# Patient Record
Sex: Female | Born: 1950 | Race: Black or African American | Hispanic: No | Marital: Married | Smoking: Never smoker
Health system: Southern US, Community
[De-identification: ages and names within clinical notes are randomized; demographics above are authoritative.]

## PROBLEM LIST (undated history)

## (undated) DIAGNOSIS — E039 Hypothyroidism, unspecified: Secondary | ICD-10-CM

## (undated) DIAGNOSIS — M1990 Unspecified osteoarthritis, unspecified site: Secondary | ICD-10-CM

## (undated) DIAGNOSIS — K449 Diaphragmatic hernia without obstruction or gangrene: Secondary | ICD-10-CM

## (undated) DIAGNOSIS — K222 Esophageal obstruction: Secondary | ICD-10-CM

## (undated) DIAGNOSIS — K649 Unspecified hemorrhoids: Secondary | ICD-10-CM

## (undated) DIAGNOSIS — I219 Acute myocardial infarction, unspecified: Secondary | ICD-10-CM

## (undated) DIAGNOSIS — K219 Gastro-esophageal reflux disease without esophagitis: Secondary | ICD-10-CM

## (undated) DIAGNOSIS — K5792 Diverticulitis of intestine, part unspecified, without perforation or abscess without bleeding: Secondary | ICD-10-CM

## (undated) DIAGNOSIS — I10 Essential (primary) hypertension: Secondary | ICD-10-CM

## (undated) HISTORY — DX: Diverticulitis of intestine, part unspecified, without perforation or abscess without bleeding: K57.92

## (undated) HISTORY — PX: APPENDECTOMY: SHX54

## (undated) HISTORY — DX: Hypothyroidism, unspecified: E03.9

## (undated) HISTORY — DX: Esophageal obstruction: K22.2

## (undated) HISTORY — DX: Unspecified hemorrhoids: K64.9

## (undated) HISTORY — PX: CHOLECYSTECTOMY: SHX55

## (undated) HISTORY — DX: Diaphragmatic hernia without obstruction or gangrene: K44.9

## (undated) HISTORY — DX: Gastro-esophageal reflux disease without esophagitis: K21.9

## (undated) HISTORY — DX: Acute myocardial infarction, unspecified: I21.9

---

## 1984-04-25 HISTORY — PX: ABDOMINAL HYSTERECTOMY: SHX81

## 2003-08-01 ENCOUNTER — Emergency Department (HOSPITAL_COMMUNITY): Admission: EM | Admit: 2003-08-01 | Discharge: 2003-08-01 | Payer: Self-pay | Admitting: Emergency Medicine

## 2003-11-13 ENCOUNTER — Ambulatory Visit (HOSPITAL_COMMUNITY): Admission: RE | Admit: 2003-11-13 | Discharge: 2003-11-13 | Payer: Self-pay | Admitting: Family Medicine

## 2004-07-10 ENCOUNTER — Emergency Department (HOSPITAL_COMMUNITY): Admission: EM | Admit: 2004-07-10 | Discharge: 2004-07-10 | Payer: Self-pay | Admitting: Emergency Medicine

## 2004-11-19 ENCOUNTER — Ambulatory Visit (HOSPITAL_COMMUNITY): Admission: RE | Admit: 2004-11-19 | Discharge: 2004-11-19 | Payer: Self-pay | Admitting: Internal Medicine

## 2004-11-19 ENCOUNTER — Ambulatory Visit: Payer: Self-pay | Admitting: Internal Medicine

## 2004-11-19 HISTORY — PX: COLONOSCOPY: SHX174

## 2004-12-01 ENCOUNTER — Ambulatory Visit (HOSPITAL_COMMUNITY): Admission: RE | Admit: 2004-12-01 | Discharge: 2004-12-01 | Payer: Self-pay | Admitting: Family Medicine

## 2005-09-16 ENCOUNTER — Ambulatory Visit (HOSPITAL_COMMUNITY): Admission: RE | Admit: 2005-09-16 | Discharge: 2005-09-16 | Payer: Self-pay | Admitting: Pulmonary Disease

## 2005-12-13 ENCOUNTER — Ambulatory Visit (HOSPITAL_COMMUNITY): Admission: RE | Admit: 2005-12-13 | Discharge: 2005-12-13 | Payer: Self-pay | Admitting: Pulmonary Disease

## 2006-12-23 ENCOUNTER — Emergency Department (HOSPITAL_COMMUNITY): Admission: EM | Admit: 2006-12-23 | Discharge: 2006-12-23 | Payer: Self-pay | Admitting: Emergency Medicine

## 2007-02-27 ENCOUNTER — Ambulatory Visit (HOSPITAL_COMMUNITY): Admission: RE | Admit: 2007-02-27 | Discharge: 2007-02-27 | Payer: Self-pay | Admitting: Pulmonary Disease

## 2007-05-28 ENCOUNTER — Ambulatory Visit (HOSPITAL_COMMUNITY): Admission: RE | Admit: 2007-05-28 | Discharge: 2007-05-28 | Payer: Self-pay | Admitting: Pulmonary Disease

## 2007-10-24 ENCOUNTER — Ambulatory Visit (HOSPITAL_COMMUNITY): Admission: RE | Admit: 2007-10-24 | Discharge: 2007-10-24 | Payer: Self-pay | Admitting: Pulmonary Disease

## 2008-02-28 ENCOUNTER — Ambulatory Visit (HOSPITAL_COMMUNITY): Admission: RE | Admit: 2008-02-28 | Discharge: 2008-02-28 | Payer: Self-pay | Admitting: Pulmonary Disease

## 2008-04-25 DIAGNOSIS — K5792 Diverticulitis of intestine, part unspecified, without perforation or abscess without bleeding: Secondary | ICD-10-CM

## 2008-04-25 HISTORY — DX: Diverticulitis of intestine, part unspecified, without perforation or abscess without bleeding: K57.92

## 2008-07-29 ENCOUNTER — Encounter (INDEPENDENT_AMBULATORY_CARE_PROVIDER_SITE_OTHER): Payer: Self-pay | Admitting: *Deleted

## 2008-07-30 ENCOUNTER — Ambulatory Visit: Payer: Self-pay | Admitting: Internal Medicine

## 2008-07-30 DIAGNOSIS — R1313 Dysphagia, pharyngeal phase: Secondary | ICD-10-CM

## 2008-07-30 DIAGNOSIS — E669 Obesity, unspecified: Secondary | ICD-10-CM | POA: Insufficient documentation

## 2008-07-30 DIAGNOSIS — Z8679 Personal history of other diseases of the circulatory system: Secondary | ICD-10-CM | POA: Insufficient documentation

## 2008-07-30 DIAGNOSIS — K219 Gastro-esophageal reflux disease without esophagitis: Secondary | ICD-10-CM | POA: Insufficient documentation

## 2008-07-31 ENCOUNTER — Encounter: Payer: Self-pay | Admitting: Internal Medicine

## 2008-08-15 ENCOUNTER — Ambulatory Visit (HOSPITAL_COMMUNITY): Admission: RE | Admit: 2008-08-15 | Discharge: 2008-08-15 | Payer: Self-pay | Admitting: Internal Medicine

## 2008-08-15 ENCOUNTER — Ambulatory Visit: Payer: Self-pay | Admitting: Internal Medicine

## 2008-08-15 HISTORY — PX: ESOPHAGOGASTRODUODENOSCOPY: SHX1529

## 2008-10-10 ENCOUNTER — Emergency Department (HOSPITAL_COMMUNITY): Admission: EM | Admit: 2008-10-10 | Discharge: 2008-10-10 | Payer: Self-pay | Admitting: Emergency Medicine

## 2008-10-28 ENCOUNTER — Encounter (INDEPENDENT_AMBULATORY_CARE_PROVIDER_SITE_OTHER): Payer: Self-pay | Admitting: *Deleted

## 2008-12-02 ENCOUNTER — Encounter (INDEPENDENT_AMBULATORY_CARE_PROVIDER_SITE_OTHER): Payer: Self-pay | Admitting: *Deleted

## 2008-12-22 ENCOUNTER — Telehealth (INDEPENDENT_AMBULATORY_CARE_PROVIDER_SITE_OTHER): Payer: Self-pay | Admitting: *Deleted

## 2008-12-23 ENCOUNTER — Telehealth (INDEPENDENT_AMBULATORY_CARE_PROVIDER_SITE_OTHER): Payer: Self-pay | Admitting: *Deleted

## 2009-03-10 ENCOUNTER — Ambulatory Visit (HOSPITAL_COMMUNITY): Admission: RE | Admit: 2009-03-10 | Discharge: 2009-03-10 | Payer: Self-pay | Admitting: Pulmonary Disease

## 2009-10-09 ENCOUNTER — Ambulatory Visit (HOSPITAL_COMMUNITY): Admission: RE | Admit: 2009-10-09 | Discharge: 2009-10-09 | Payer: Self-pay | Admitting: Pulmonary Disease

## 2009-11-18 ENCOUNTER — Encounter: Payer: Self-pay | Admitting: Internal Medicine

## 2009-11-18 ENCOUNTER — Ambulatory Visit: Payer: Self-pay | Admitting: Gastroenterology

## 2009-11-18 DIAGNOSIS — K921 Melena: Secondary | ICD-10-CM | POA: Insufficient documentation

## 2009-11-19 ENCOUNTER — Encounter: Payer: Self-pay | Admitting: Internal Medicine

## 2009-12-11 ENCOUNTER — Ambulatory Visit (HOSPITAL_COMMUNITY): Admission: RE | Admit: 2009-12-11 | Discharge: 2009-12-11 | Payer: Self-pay | Admitting: Internal Medicine

## 2009-12-11 ENCOUNTER — Ambulatory Visit: Payer: Self-pay | Admitting: Internal Medicine

## 2009-12-11 HISTORY — PX: COLONOSCOPY: SHX174

## 2010-03-12 ENCOUNTER — Ambulatory Visit (HOSPITAL_COMMUNITY): Admission: RE | Admit: 2010-03-12 | Discharge: 2010-03-12 | Payer: Self-pay | Admitting: Pulmonary Disease

## 2010-03-26 ENCOUNTER — Encounter: Admission: RE | Admit: 2010-03-26 | Discharge: 2010-03-26 | Payer: Self-pay | Admitting: Pulmonary Disease

## 2010-04-05 ENCOUNTER — Encounter: Payer: Self-pay | Admitting: Orthopedic Surgery

## 2010-04-06 ENCOUNTER — Ambulatory Visit (HOSPITAL_COMMUNITY)
Admission: RE | Admit: 2010-04-06 | Discharge: 2010-04-06 | Payer: Self-pay | Source: Home / Self Care | Attending: Pulmonary Disease | Admitting: Pulmonary Disease

## 2010-05-05 ENCOUNTER — Encounter: Payer: Self-pay | Admitting: Orthopedic Surgery

## 2010-05-05 ENCOUNTER — Ambulatory Visit
Admission: RE | Admit: 2010-05-05 | Discharge: 2010-05-05 | Payer: Self-pay | Source: Home / Self Care | Attending: Orthopedic Surgery | Admitting: Orthopedic Surgery

## 2010-05-05 DIAGNOSIS — M7710 Lateral epicondylitis, unspecified elbow: Secondary | ICD-10-CM | POA: Insufficient documentation

## 2010-05-05 DIAGNOSIS — M7700 Medial epicondylitis, unspecified elbow: Secondary | ICD-10-CM | POA: Insufficient documentation

## 2010-05-25 NOTE — Assessment & Plan Note (Signed)
Summary: HEMATOCHEZIA/SS   Visit Type:  Initial Consult Referring Provider:  Juanetta Gosling Primary Care Provider:  Juanetta Gosling  Chief Complaint:  hematochezia.  History of Present Illness: Mackenzie Mcpherson is a pleasant 60 y/o AA female who presents at the request of Dr. Juanetta Gosling for further evaluation of hematachezia. Recently had couple days of brbpr, described as small volume. Hgb 11.9 (12 normal). Took supp and it stopped. BM regular but occasional mild constipation. No melena. C/O heartburn worse recently. Was using Prilosec prn. Trying to change diet. Feels full, burning, gas, epigastric pain especially with certain foods. Recently started back on Prilosec daily for daily symptoms, nocturnal symptoms.  In 6/10, she had CT A/P for LLQ pain. CT A/P-->Mild inflammatory change at the descending colon sigmoid colon junction region.  Felt to be secondary to mild diverticulitis. Epiploic appendagitis would be another possibility.   Current Medications (verified): 1)  Benicar Hct 20-12.5 Mg Tabs (Olmesartan Medoxomil-Hctz) .... Once Daily 2)  Prilosec 20 Mg Cpdr (Omeprazole) .... Once Daily 3)  Multivitamins  Tabs (Multiple Vitamin) .... Two Tablets Daily 4)  Fish Oil 1000 Mg Caps (Omega-3 Fatty Acids) .... Once Daily 5)  Vitamin C Cr 500 Mg Cr-Caps (Ascorbic Acid) .... Once Daily 6)  Saline Mist .... As Directed 7)  Levothroid 50 Mcg Tabs (Levothyroxine Sodium) .... Take 1 Tablet By Mouth Once A Day  Allergies (verified): 1)  ! * Protonix  Past History:  Past Medical History: Hypertension GERD Obesity Hypothyroidism Diverticulitis, 6/10 CT TCS 7/06-->normal EGD/ED 4/10-->Noncritical Schatzki's ring with superimposed distal esophageal erosion consistent with erosive reflux esophagitis status post passage of a Maloney dilator and biopsy disruption. Moderate size hiatal hernia.  Past Surgical History: Partial hysterectomy 1987 Appendectomy 1970s Cholecystectomy 1970s  Family  History: Father: (deceased 42) accident  Mother: (deceased 65) DM, CVA Siblings: (4)  1  brother colon carcinoma dx late 30's, htn, arrythmia, 2nd brother recently diagnosed with colon carcinoma, age 32, taking chemo  Social History: Marital Status: Married 1982 Children: no Occupation:  retired, [employer] officer. Owns [employer]  Patient has never smoked.  Alcohol Use - no Daily Caffeine Use Illicit Drug Use - no Patient does not get regular exercise.   Review of Systems General:  Denies fever, chills, sweats, anorexia, fatigue, weakness, and weight loss. Eyes:  Denies vision loss. ENT:  Denies nasal congestion, sore throat, hoarseness, and difficulty swallowing. CV:  Denies chest pains, angina, palpitations, dyspnea on exertion, and peripheral edema. Resp:  Denies dyspnea at rest, dyspnea with exercise, cough, sputum, and wheezing. GI:  See HPI. GU:  Denies urinary burning and blood in urine. MS:  Denies joint pain / LOM. Derm:  Denies rash and itching. Neuro:  Denies weakness, frequent headaches, memory loss, and confusion. Psych:  Denies depression and anxiety. Endo:  Denies unusual weight change. Heme:  Denies bruising and bleeding. Allergy:  Denies hives and rash.  Vital Signs:  Patient profile:   60 year old female Menstrual status:  hysterectomy Height:      59 inches Weight:      163 pounds BMI:     33.04 Temp:     98.8 degrees F oral Pulse rate:   88 / minute BP sitting:   122 / 78  (left arm) Cuff size:   regular  Vitals Entered By: Cloria Spring LPN (November 18, 2009 2:08 PM)  Physical Exam  General:  Well developed, well nourished, no acute distress. Head:  Normocephalic and atraumatic. Eyes:  Conjunctivae pink, no scleral icterus.  Mouth:  Oropharyngeal mucosa moist, pink.  No lesions, erythema or exudate.    Neck:  Supple; no masses or thyromegaly. Lungs:  Clear throughout to auscultation. Heart:  Regular rate  and rhythm; no murmurs, rubs,  or bruits. Abdomen:  Bowel sounds normal.  Abdomen is soft, nontender, nondistended.  No rebound or guarding.  No hepatosplenomegaly, masses or hernias.  No abdominal bruits.  Rectal:  deferred until time of colonoscopy.   Extremities:  No clubbing, cyanosis, edema or deformities noted. Neurologic:  Alert and  oriented x4;  grossly normal neurologically. Skin:  Intact without significant lesions or rashes. Cervical Nodes:  No significant cervical adenopathy. Psych:  Alert and cooperative. Normal mood and affect.  Impression & Recommendations:  Problem # 1:  BLOOD IN STOOL (ICD-578.1)  Recent small volume hematachezia. In 2010, had abnormality had descending colon/sigmoid colon area thought to be due to diverticulitis. Two brother with h/o CRC, one dx in his 37s, the other recently in his 80s. Her last TCS was five years ago.  Colonoscopy to be performed in near future.  Risks, alternatives, and benefits including but not limited to the risk of reaction to medication, bleeding, infection, and perforation were addressed.  Patient voiced understanding and provided verbal consent.   Orders: Consultation Level III (16109)  Problem # 2:  GERD (ICD-530.81)  Recommend daily Prilosec for frequent GERD symptoms. If symptoms are not adequately controlled, consider different PPI. No EGD at this time, given recent one in 2010.  Orders: Consultation Level III (60454) I would like to thank Dr. Juanetta Gosling for allowing [place] to take part in the care of this nice patient.

## 2010-05-25 NOTE — Letter (Signed)
Summary: TCS Order-  TCS Order-   Imported By: Peggyann Shoals 11/18/2009 14:49:22  _____________________________________________________________________  External Attachment:    Type:   Image     Comment:   External Document

## 2010-05-27 NOTE — Assessment & Plan Note (Signed)
Summary: rt elbow pain [facility]r at [facility]   Visit Type:  new patient Referring Provider:  Juanetta Gosling Primary Provider:  Juanetta Gosling  CC:  right elbow pain.  History of Present Illness: I saw Mackenzie Mcpherson in the office today for an initial visit.  She is a 60 years old woman with the complaint of:  right elbow pain.  October 2011 fell up stairs.  Xrays right elbow [facility] 04/06/10.  This 60 year old female complains of sharp intermittent RIGHT elbow pain medial side greater than lateral side since a fall in October of 2011. She had an x-ray in December, which did not show any fracture.  Her pain radiates between a 5 and 7/10. She has not had any treatment at this point other than some over-the-counter Tylenol.  She has increased pain when she tries to hold onto something or lift something such as a basket    Allergies: 1)  ! * Protonix  Past History:  Past Medical History: Last updated: Nov 27, 2009 Hypertension GERD Obesity Hypothyroidism Diverticulitis, 6/10 CT TCS 7/06-->normal EGD/ED 4/10-->Noncritical Schatzki's ring with superimposed distal esophageal erosion consistent with erosive reflux esophagitis status post passage of a Maloney dilator and biopsy disruption. Moderate size hiatal hernia.  Past Surgical History: Last updated: Nov 27, 2009 Partial hysterectomy 1987 Appendectomy 1970s Cholecystectomy 1970s  Family History: Last updated: 11-27-2009 Father: (deceased 27) accident  Mother: (deceased 11) DM, CVA Siblings: (4)  1  brother colon carcinoma dx late 30's, htn, arrythmia, 2nd brother recently diagnosed with colon carcinoma, age 75, taking chemo  Social History: Last updated: November 27, 2009 Marital Status: Married 1982 Children: no Occupation:  retired, [employer] officer. Owns [employer]  Patient has never smoked.  Alcohol Use - no Daily Caffeine Use Illicit Drug Use - no Patient does not get regular exercise.    Risk Factors: Exercise: no (07/30/2008)  Risk Factors: Smoking Status: never (07/30/2008)  Review of Systems Constitutional:  Complains of weight gain and fatigue; denies weight loss, fever, and chills. Respiratory:  Complains of couch; denies short of breath, wheezing, tightness, pain on inspiration, and snoring . Gastrointestinal:  Complains of heartburn; denies nausea, vomiting, diarrhea, constipation, and blood in your stools. Musculoskeletal:  Complains of muscle pain; denies joint pain, swelling, instability, stiffness, redness, and heat. Immunology:  Complains of seasonal allergies; denies sinus problems and allergic to bee stings.  The review of systems is negative for Cardiovascular, Genitourinary, Neurologic, Endocrine, Psychiatric, Skin, HEENT, and Hemoatologic.  Physical Exam  Additional Exam:  general appearance was normal.  Grooming and hygiene were normal.  She is oriented x3. Her mood and affect were normal.  Abnormal ambulation with no support.  Friable. Inspection revealed tenderness on the medial and lateral sides of the elbow and the flexor tendons and extensor tendons. She had no pain with extension against resistance with the elbow extended, but did have pain with pronation and with flexion of the wrist, and it was on the medial side of the elbow.  The elbow was stable. There was no loss of strength. The skin was normal.  Pulse and temperature were normal.  There is no lymphadenopathy.  Sensation was normal as well. There were no pathologic reflexes and balance was normal.   Impression & Recommendations:  Problem # 1:  LATERAL EPICONDYLITIS (ICD-726.32) Assessment New  The x-rays were done at [facility]. The report and the films have been reviewed.   She apparently has medial and lateral epicondylitis with medial predominating.  Orders: New Patient Level III (  35573)  Problem # 2:  MEDIAL EPICONDYLITIS (ICD-726.31)  Orders: New Patient Level III  (22025)  Medications Added to Medication List This Visit: 1)  Nabumetone 500 Mg Tabs (Nabumetone) .Marland Kitchen.. 1 by mouth two times a day  Patient Instructions: 1)  DIAGNOSIS IS TENDONITIS  2)  TREATMENT PLAN  3)  6 WEEKS OF BRACING, ANTIINFLAMMATORIES, ICE AND HOME EXERCISE PROGRAM  4)  RECHECK IN 6 WEEKS  Prescriptions: NABUMETONE 500 MG TABS (NABUMETONE) 1 by mouth two times a day  #60 x 1   Entered and Authorized by:   Fuller Canada MD   Signed by:   Fuller Canada MD on 05/05/2010   Method used:   Print then Give to Patient   RxID:   [id number]    Orders Added: 1)  New Patient Level III [61607]

## 2010-05-27 NOTE — Letter (Signed)
Summary: history form   history form   Imported By: Eugenio Hoes 05/05/2010 15:45:46  _____________________________________________________________________  External Attachment:    Type:   Image     Comment:   External Document

## 2010-05-28 NOTE — Letter (Signed)
Summary: PATIENT RECORDS FROM DR [facility]  PATIENT RECORDS FROM DR [facility]   Imported By: Rexene Alberts 11/19/2009 10:50:06  _____________________________________________________________________  External Attachment:    Type:   Image     Comment:   External Document

## 2010-06-17 ENCOUNTER — Ambulatory Visit (INDEPENDENT_AMBULATORY_CARE_PROVIDER_SITE_OTHER): Payer: Federal, State, Local not specified - PPO | Admitting: Orthopedic Surgery

## 2010-06-17 ENCOUNTER — Encounter: Payer: Self-pay | Admitting: Orthopedic Surgery

## 2010-06-17 DIAGNOSIS — M79609 Pain in unspecified limb: Secondary | ICD-10-CM | POA: Insufficient documentation

## 2010-06-17 DIAGNOSIS — M7710 Lateral epicondylitis, unspecified elbow: Secondary | ICD-10-CM

## 2010-06-22 NOTE — Medication Information (Signed)
Summary: Tax adviser   Imported By: Cammie Sickle 06/18/2010 11:24:46  _____________________________________________________________________  External Attachment:    Type:   Image     Comment:   External Document

## 2010-06-22 NOTE — Assessment & Plan Note (Signed)
Summary: 6 we reck rt elbow/[facility]bs/c[facility]    work # [phone]   Visit Type:  Follow-up Referring Provider:  Juanetta Gosling Primary Provider:  Juanetta Gosling  CC:  right elbow.  History of Present Illness: I saw Mackenzie Mcpherson in the office today for a 6 week  followup visit.  She is a 60 years old woman with the complaint of:  right elbow  October 2011 fell up stairs.  Xrays right elbow [facility] 04/06/10.  This 60 year old female complains of sharp intermittent RIGHT elbow pain medial side greater than lateral side since a fall in October of 2011. She had an x-ray in December, which did not show any fracture.  Medications: Nabumetone 500 Mg Tabs (Nabumetone) .Marland Kitchen.. 1 by mouth two times a day  Complaints: She states that she feels better, she still has some pain. she does not take the medicine all the time because it makes her sleepy.  Allergies: 1)  ! * Protonix  Physical Exam  Additional Exam:  RIGHT elbow  The lateral elbow symptoms seem to have gotten better.  She did not have pain with wrist extension against resistance.  She does have some anterior pain in the biceps tendon and biceps muscle area which is exacerbated by flexion.  She had mild impingement sign.     Impression & Recommendations:  Problem # 1:  LATERAL EPICONDYLITIS (ICD-726.32) Assessment Improved  continued counterforce brace for working.  Continue exercises.  Take medication nabumetone and night 2 tablets at a time  Orders: Est. Patient Level II (86578)  Problem # 2:  ARM PAIN, RIGHT (ICD-729.5) Assessment: New  pain in the biceps area most likely soft tissue muscle inflammation recommend diaphoresis  Orders: Est. Patient Level II (46962)  Patient Instructions: 1)  APPLY BIOFREEZE two times a day TO three times a day TO ARM AS NEEDED  2)  WEAR BRACE TO WORK  3)  EXERCISE ELBOW AS NEEDED    Orders Added: 1)  Est. Patient Level II [95284]

## 2010-08-02 LAB — URINE CULTURE
Colony Count: NO GROWTH
Culture: NO GROWTH

## 2010-08-02 LAB — COMPREHENSIVE METABOLIC PANEL
AST: 43 U/L — ABNORMAL HIGH (ref 0–37)
CO2: 31 mEq/L (ref 19–32)
Calcium: 9.1 mg/dL (ref 8.4–10.5)
Creatinine, Ser: 0.82 mg/dL (ref 0.4–1.2)
GFR calc Af Amer: >60 mL/min (ref >60)
GFR calc non Af Amer: >60 mL/min (ref >60)

## 2010-08-02 LAB — DIFFERENTIAL
Eosinophils Relative: 2 % (ref 0–5)
Lymphocytes Relative: 28 % (ref 12–46)
Lymphs Abs: 1.6 10*3/uL (ref 0.7–4.0)
Neutrophils Relative %: 65 % (ref 43–77)

## 2010-08-02 LAB — CBC
MCHC: 35.3 g/dL (ref 30.0–36.0)
RBC: 4.24 MIL/uL (ref 3.87–5.11)
WBC: 5.8 10*3/uL (ref 4.0–10.5)

## 2010-08-02 LAB — URINALYSIS, ROUTINE W REFLEX MICROSCOPIC
Bilirubin Urine: NEGATIVE
Glucose, UA: NEGATIVE mg/dL
Ketones, ur: NEGATIVE mg/dL
pH: 5.5 (ref 5.0–8.0)

## 2010-09-07 NOTE — Op Note (Signed)
NAME:  Mackenzie Mcpherson, Mackenzie Mcpherson         ACCOUNT NO.:  [id number]   MEDICAL RECORD NO.:  [id number]          PATIENT TYPE:  AMB   LOCATION:  DAY                           FACILITY:  [facility]   PHYSICIAN:  R. Roetta Sessions, M.D. DATE OF BIRTH:  [birth year]   DATE OF PROCEDURE:  08/15/2008  DATE OF DISCHARGE:                               OPERATIVE REPORT   PROCEDURE PERFORMED:  Esophagogastroduodenoscopy with Elease Hashimoto dilation.   ENDOSCOPIST:  Jonathon Bellows, M.D.   INDICATIONS FOR THE PROCEDURE:  The patient is a 59 year old lady with  recurrent esophageal dysphagia in a background setting of chronic  gastroesophageal reflux disease.  She had her esophagus dilated back in  the 1970s in [place] and she reports with good results.  EGD is now  being done and the potential for esophageal dilation reviewed.  Her  reflux symptoms more recently have been well-controlled on Prilosec 20  mg orally daily.  She developed angioedema after a brief course of  Protonix.  This approach had been discussed with the patient at length;  please see the documentation in the medical record.  It had been  discussed with her again at the bedside reviewing the risks, benefits,  alternatives and limitations.   DESCRIPTION OF THE PROCEDURE:  The O2 saturation, blood pressure and  pulse were monitored throughout the entire procedure.  Conscious  sedation was with Versed 4 mg IV and Demerol 75 mg IV in divided doses.  Instrument; Pentax Video Chip System.  Cetacaine spray for topical  pharyngeal anesthesia.   Findings:  Esophagus:  It is notable that the patient's oral cavity was small, and  she had a prominent tongue and difficulty directly intubating the  hypopharynx/esophagus.  I eventually went indirectly guiding the scope  back with my fingers and was able to fairly easily intubate the  esophagus.  Examination of the tubular esophagus revealed a patulous EG  junction and a noncritical appearing Schatzki's ring.   There was a  common cavity phenomenon between the distal esophagus and stomach.  I  could see well into the stomach from the distal third of the esophagus.  There was a single esophageal erosion straddling the EG junction  suggestive of Barrett's esophagus __________ tumor.   Stomach:  The gastric cavity was empty and insufflated well with air for  thorough examination of the gastric mucosa including retroflexion.  The  proximal stomach and esophagogastric junction demonstrated only a  moderate size hiatal hernia; and, again the ring was noted.  Pylorus was  patent and was easily traversed.  Examination of the bulb and second  portion revealed no abnormalities.   This lady did not have a tight ring although was complaining of  dysphagia.  I did not think a 56-French Maloney or a larger bore dilator  would pass through the mouth.  I obtained a 54-French Maloney dilator  and gently guided it into the oral cavity through the hypopharynx and  advanced it fully with minimal resistance.  A look back revealed no  apparent complication related to passage of the dilator; however, the  ring remained very much intact.  Subsequently, I  utilized a jumbo biopsy  forceps and strategically took two bites of the ring to disrupt it.  This was done with very good results and minimal bleeding.   The patient tolerated the procedure well and was reactive after  endoscopy.   IMPRESSION:  1. Noncritical Schatzki's ring with superimposed distal esophageal      erosion consistent with erosive reflux esophagitis status post      passage of a Maloney dilator and biopsy disruption of the ring as      described above.  2. Moderate size hiatal hernia.  3. Otherwise normal stomach, duodenum-1 and duodenum-2.   RECOMMENDATIONS:  Hiatal hernia and gastroesophageal reflux disease  literature was provided to the patient.  For the next month I have asked  her to increase her Prilosec to 20 mg orally twice daily,  i.e. before  breakfast and supper, and thereafter to drop back to Prilosec 20 mg each  morning before breakfast.      R. Roetta Sessions, M.D.  Electronically Signed     RMR/MEDQ  D:  08/15/2008  T:  08/15/2008  Job:  [id number]   cc:   Ramon Dredge L. Juanetta Gosling, M.D.  Fax: [phone]

## 2010-09-10 NOTE — Op Note (Signed)
NAME:  Mackenzie Mcpherson, Mackenzie Mcpherson         ACCOUNT NO.:  [id number]   MEDICAL RECORD NO.:  [id number]          PATIENT TYPE:  AMB   LOCATION:  DAY                           FACILITY:  [facility]   PHYSICIAN:  R. Roetta Sessions, M.D. DATE OF BIRTH:  16-Jan-1951   DATE OF PROCEDURE:  11/19/2004  DATE OF DISCHARGE:                                 OPERATIVE REPORT   PROCEDURE:  Screening colonoscopy.   INDICATIONS FOR PROCEDURE:  The patient is a 60 year old lady with a history  of colonic polyps [place] in [place], [place] some five years ago. Positive  family history most significant for a brother diagnosed with colorectal  cancer in his 30s. She has no lower GI tract symptoms. Colonoscopy is now  being done. This approach has been discussed with the patient at length.  Potential risks, benefits, and alternatives have been reviewed and questions  answered. She is agreeable. Please see documentation in the medical record.   PROCEDURE NOTE:  O2 saturation, blood pressure, pulse, and respirations were  monitored throughout the entire procedure. Conscious sedation with Versed 3  mg IV and Demerol 75 mg IV in divided doses.   INSTRUMENT:  Olympus video chip system, adult and pediatric colonoscopy.   FINDINGS:  Digital rectal exam revealed no abnormalities.   ENDOSCOPIC FINDINGS:  Prep was good.   Rectum:  Examination of the rectal mucosa including retroflexed view of the  anal verge revealed no abnormalities.   Colon:  Colonic mucosa was surveyed from the rectosigmoid junction. After  about 40 cm, there was an acute turn in the colon which we could not over  come with the adult scope. I withdrew the adult scope and obtained a  pediatric colonoscopy, advanced to this level, was able to get around the  turn, and advanced scope to the cecum. Cecum, ileocecal valve, and  appendiceal orifice were well seen and photographed for the record. From  this level, the scope was slowly withdrawn, and all  previously mentioned  mucosal surfaces were again seen. The colonic mucosa appeared entirely  normal. The patient tolerated the procedure well and was reactive to  endoscopy.   IMPRESSION:  1.  Normal rectum.  2.  Normal colon.   RECOMMENDATIONS:  Repeat high risk screening colonoscopy five years.       RMR/MEDQ  D:  11/19/2004  T:  11/19/2004  Job:  [id number]   cc:   Melvyn Novas, MD  [address] [place]  [place] [address]  Fax: [phone]

## 2011-02-04 LAB — URINALYSIS, ROUTINE W REFLEX MICROSCOPIC
Glucose, UA: NEGATIVE
pH: 6

## 2011-02-04 LAB — URINE MICROSCOPIC-ADD ON

## 2011-03-24 ENCOUNTER — Other Ambulatory Visit (HOSPITAL_COMMUNITY): Payer: Self-pay | Admitting: Pulmonary Disease

## 2011-03-24 DIAGNOSIS — Z139 Encounter for screening, unspecified: Secondary | ICD-10-CM

## 2011-04-04 ENCOUNTER — Ambulatory Visit (HOSPITAL_COMMUNITY): Payer: Federal, State, Local not specified - PPO

## 2011-04-07 ENCOUNTER — Ambulatory Visit (HOSPITAL_COMMUNITY)
Admission: RE | Admit: 2011-04-07 | Discharge: 2011-04-07 | Disposition: A | Discharge status: Home / Self Care | Payer: Federal, State, Local not specified - PPO | Source: Ambulatory Visit | Attending: Pulmonary Disease | Admitting: Pulmonary Disease

## 2011-04-07 DIAGNOSIS — Z139 Encounter for screening, unspecified: Secondary | ICD-10-CM

## 2011-04-07 DIAGNOSIS — Z1231 Encounter for screening mammogram for malignant neoplasm of breast: Secondary | ICD-10-CM | POA: Insufficient documentation

## 2011-04-11 ENCOUNTER — Emergency Department (HOSPITAL_COMMUNITY)
Admission: EM | Admit: 2011-04-11 | Discharge: 2011-04-11 | Disposition: A | Discharge status: Home / Self Care | Payer: Federal, State, Local not specified - PPO | Attending: Emergency Medicine | Admitting: Emergency Medicine

## 2011-04-11 ENCOUNTER — Emergency Department (HOSPITAL_COMMUNITY): Payer: Federal, State, Local not specified - PPO

## 2011-04-11 DIAGNOSIS — M79676 Pain in unspecified toe(s): Secondary | ICD-10-CM

## 2011-04-11 DIAGNOSIS — E079 Disorder of thyroid, unspecified: Secondary | ICD-10-CM | POA: Insufficient documentation

## 2011-04-11 DIAGNOSIS — M79609 Pain in unspecified limb: Secondary | ICD-10-CM | POA: Insufficient documentation

## 2011-04-11 DIAGNOSIS — Z79899 Other long term (current) drug therapy: Secondary | ICD-10-CM | POA: Insufficient documentation

## 2011-04-11 DIAGNOSIS — M2550 Pain in unspecified joint: Secondary | ICD-10-CM | POA: Insufficient documentation

## 2011-04-11 DIAGNOSIS — I10 Essential (primary) hypertension: Secondary | ICD-10-CM | POA: Insufficient documentation

## 2011-04-11 DIAGNOSIS — L989 Disorder of the skin and subcutaneous tissue, unspecified: Secondary | ICD-10-CM | POA: Insufficient documentation

## 2011-04-11 HISTORY — DX: Essential (primary) hypertension: I10

## 2011-04-11 MED ORDER — CEPHALEXIN 500 MG PO CAPS: 500.0000 mg | ORAL_CAPSULE | Freq: Four times a day (QID) | ORAL | Status: AC

## 2011-04-11 NOTE — ED Notes (Signed)
Pt has had sore on R 4th toe x 1 week.

## 2011-04-13 NOTE — ED Provider Notes (Signed)
History     CSN: [id number] Arrival date & time: 04/11/2011 10:00 AM   First MD Initiated Contact with Patient 04/11/11 1049      Chief Complaint  Patient presents with  . Toe Pain    (Consider location/radiation/quality/duration/timing/severity/associated sxs/prior treatment) HPI Comments: Patient reports pain and hardened area to the right fourth toe for one week.  Sx's began after she was wearing tight fitting shoes.  She denies fever, redness , or drainage from the toe.    Patient is a 60 y.o. female presenting with toe pain. The history is provided by the patient.  Toe Pain This is a new problem. The current episode started 1 to 4 weeks ago. The problem occurs constantly. The problem has been unchanged. Associated symptoms include arthralgias. Pertinent negatives include no fever, joint swelling, myalgias, numbness, rash or weakness. The symptoms are aggravated by walking and standing. She has tried nothing for the symptoms. The treatment provided no relief.    Past Medical History  Diagnosis Date  . Hypertension   . Thyroid disease     Past Surgical History  Procedure Date  . Abdominal hysterectomy   . Cholecystectomy     No family history on file.  History  Substance Use Topics  . Smoking status: Never Smoker   . Smokeless tobacco: Not on file  . Alcohol Use: No    OB History    Grav Para Term Preterm Abortions TAB SAB Ect Mult Living                  Review of Systems  Constitutional: Negative for fever.  Musculoskeletal: Positive for arthralgias. Negative for myalgias, joint swelling and gait problem.  Skin: Negative for rash and wound.  Neurological: Negative for weakness and numbness.  All other systems reviewed and are negative.    Allergies  Pantoprazole sodium  Home Medications   Current Outpatient Rx  Name Route Sig Dispense Refill  . LEVOTHYROXINE SODIUM 50 MCG PO TABS Oral Take 50 mcg by mouth daily.      Marland Kitchen LOSARTAN POTASSIUM-HCTZ  100-12.5 MG PO TABS Oral Take 1 tablet by mouth daily.      Marland Kitchen ANIMAL CHEWABLE MULTIVITAMIN PO CHEW Oral Chew 1 tablet by mouth daily.      Marland Kitchen SALINE NASAL SPRAY 0.65 % NA SOLN Nasal Place 1 spray into the nose as needed. Congestion     . CEPHALEXIN 500 MG PO CAPS Oral Take 1 capsule (500 mg total) by mouth 4 (four) times daily. For 10 days 40 capsule 0    BP 121/64  Pulse 78  Temp(Src) 98.1 F (36.7 C) (Oral)  Resp 16  Ht 4\' 11"  (1.499 m)  Wt 157 lb (71.215 kg)  BMI 31.71 kg/m2  SpO2 99%  Physical Exam  Nursing note and vitals reviewed. Constitutional: She is oriented to person, place, and time. She appears well-developed and well-nourished. No distress.  HENT:  Head: Normocephalic and atraumatic.  Neck: Normal range of motion.  Cardiovascular: Normal rate, regular rhythm and normal heart sounds.   Pulmonary/Chest: Effort normal and breath sounds normal.  Musculoskeletal: Normal range of motion. She exhibits no tenderness.       Right foot: She exhibits normal range of motion, no bony tenderness, no crepitus and no laceration.       Feet:  Neurological: She is alert and oriented to person, place, and time. No cranial nerve deficit. She exhibits normal muscle tone. Coordination normal.  Skin: Skin is warm and  dry.    ED Course  Procedures (including critical care time)  Dg Toe 4th Right  04/11/2011  *RADIOLOGY REPORT*  Clinical Data: Pain and swelling  RIGHT TOE - 2+ VIEW  Comparison: None.  Findings: Three views of the right toes submitted.  No acute fracture or subluxation.  There is soft tissue prominence dorsal aspect of fifth toe.  IMPRESSION: No acute fracture or subluxation.  Soft tissue prominence dorsal aspect of fifth toe.  Original Report Authenticated By: Natasha Mead, M.D.    ,     1. Toe pain       MDM    harden area to the dorsal surface of the right fourth toe,  No erythema, fluctuance or edema of the toe.  No hx of possible FB or injury.  Could possibly be  early cellulitis though doubtful.  Appears more c/w callous formation.  I will have the toe bandaged and start kelfex and give her referral for podiatry.         Claudie Rathbone L. [place], [place] 04/13/11 1756

## 2011-04-15 NOTE — ED Provider Notes (Signed)
Evaluation and management procedures were performed by the PA/NP under my supervision/collaboration.   Lanah Steines D Erielle Gawronski, MD 04/15/11 1519 

## 2011-12-20 ENCOUNTER — Other Ambulatory Visit (HOSPITAL_COMMUNITY): Payer: Self-pay | Admitting: Pulmonary Disease

## 2011-12-20 ENCOUNTER — Encounter: Payer: Self-pay | Admitting: Internal Medicine

## 2011-12-20 DIAGNOSIS — R109 Unspecified abdominal pain: Secondary | ICD-10-CM

## 2011-12-21 ENCOUNTER — Ambulatory Visit: Payer: Federal, State, Local not specified - PPO | Admitting: Gastroenterology

## 2011-12-21 ENCOUNTER — Encounter: Payer: Self-pay | Admitting: Urgent Care

## 2011-12-21 ENCOUNTER — Ambulatory Visit (INDEPENDENT_AMBULATORY_CARE_PROVIDER_SITE_OTHER): Payer: Federal, State, Local not specified - PPO | Admitting: Urgent Care

## 2011-12-21 VITALS — BP 114/69 | HR 66 | Temp 97.2°F | Ht 59.0 in | Wt 161.2 lb

## 2011-12-21 DIAGNOSIS — R197 Diarrhea, unspecified: Secondary | ICD-10-CM

## 2011-12-21 DIAGNOSIS — K219 Gastro-esophageal reflux disease without esophagitis: Secondary | ICD-10-CM

## 2011-12-21 DIAGNOSIS — K5792 Diverticulitis of intestine, part unspecified, without perforation or abscess without bleeding: Secondary | ICD-10-CM

## 2011-12-21 DIAGNOSIS — R109 Unspecified abdominal pain: Secondary | ICD-10-CM

## 2011-12-21 DIAGNOSIS — K5732 Diverticulitis of large intestine without perforation or abscess without bleeding: Secondary | ICD-10-CM

## 2011-12-21 MED ORDER — OMEPRAZOLE 20 MG PO CPDR: 20.0000 mg | DELAYED_RELEASE_CAPSULE | Freq: Every day | ORAL | Status: DC

## 2011-12-21 NOTE — Assessment & Plan Note (Addendum)
Mackenzie Mcpherson is a pleasant 61 y.o. female with 4-week history of lower abdominal pain suspicious for diverticulitis. She was treated with a ten-day course of Cipro and Flagyl, but continues to have persistent pain.  She's also developed diarrhea. Differentials include refractory diverticulitis, diverticular abscess, C. difficile colitis, or irritable bowel syndrome.  C. difficile PCR Followup on CT scan ordered by Dr. Juanetta Gosling Continue ALIGN daily Offered pain medications, however patient declined To ER if severe pain

## 2011-12-21 NOTE — Patient Instructions (Addendum)
Return stool to lab ASAP Begin omeprazole 20mg  daily for acid reflux for next 30 day trial Continue ALIGN daily Keep CT planned for Friday To ER if severe pain   Low Fiber and Residue Restricted Diet (For next 2 weeks) A low fiber diet restricts foods that contain carbohydrates that are not digested in the small intestine. A diet containing about 10 g of fiber is considered low fiber. The diet needs to be individualized to suit patient tolerances and preferences and to avoid unnecessary restrictions. Generally, the foods emphasized in a low fiber diet have no skins or seeds. They may have been processed to remove bran, germ, or husks. Cooking may not necessarily eliminate the fiber. Cooking may, in fact, enable a greater quantity of fiber to be consumed in a lesser volume. Legumes and nuts are also restricted. The term low residue has also been used to describe low fiber diets, although the two are not the same. Residue refers to any substance that adds to bowel (colonic) contents, such as sloughed cells and intestinal bacteria, in addition to fiber. Residue-containing foods, prunes and prune juice, milk, and connective tissue from meats may also need to be eliminated. It is important to eliminate these foods during sudden (acute) attacks of inflammatory bowel disease, when there is a partial obstruction due to another reason, or when minimal fecal output is desired. When these problems are gone, a more normal diet may be used. PURPOSE  Prevent blockage of a partially obstructed or narrowed gastrointestinal tract.   Reduce stool weight and volume.   Slow the movement of waste.  WHEN IS THIS DIET USED?  Acute phase of Crohn's disease, ulcerative colitis, regional enteritis, or diverticulitis.   Narrowing (stenosis) of intestinal or esophageal tubes (lumina).   Transitional diet following surgery, injury (trauma), or illness.  ADEQUACY This diet is nutritionally adequate based on individual  food choices according to the Recommended Dietary Allowances of the [employer]. CHOOSING FOODS Check labels, especially on foods from the starch list. Often, dietary fiber content is listed with the Nutrition Facts panel.  Breads and Starches  Allowed: White, [place], and pita breads, plain rolls, buns, or sweet rolls, doughnuts, waffles, pancakes, bagels. Plain muffins, sweet breads, biscuits, matzoth. Flour. Soda, saltine, or graham crackers. Pretzels, rusks, melba toast, zwieback. Cooked cereals: cornmeal, farina, cream cereals. Dry cereals: refined corn, wheat, rice, and oat cereals (check label). Potatoes prepared any way without skins, refined macaroni, spaghetti, noodles, refined rice.   Avoid: Bread, rolls, or crackers made with whole-wheat, multigrains, rye, bran seeds, nuts, or coconut. Corn tortillas, table-shells. Corn chips, tortilla chips. Cereals containing whole-grains, multigrains, bran, coconut, nuts, or raisins. Cooked or dry oatmeal. Coarse wheat cereals, granola. Cereals advertised as "high fiber." Potato skins. Whole-grain pasta, wild or brown rice. Popcorn.  Vegetables  Allowed:  Strained tomato and vegetable juices. Fresh: tender lettuce, cucumber, cabbage, spinach, bean sprouts. Cooked, canned: asparagus, bean sprouts, cut green or wax beans, cauliflower, pumpkin, beets, mushrooms, olives, spinach, yellow squash, tomato, tomato sauce (no seeds), zucchini (peeled), turnips. Canned sweet potatoes. Small amounts of celery, onion, radish, and green pepper may be used. Keep servings limited to  cup.   Avoid: Fresh, cooked, or canned: artichokes, baked beans, beet greens, broccoli, Brussels sprouts, French-style green beans, corn, kale, legumes, peas, sweet potatoes. Cooked: green or red cabbage, spinach. Avoid large servings of any vegetables.  Fruit  Allowed:  All fruit juices except prune juice. Cooked or canned: apricots applesauce, cantaloupe, cherries,  grapefruit,  grapes, kiwi, mandarin oranges, peaches, pears, fruit cocktail, pineapple, plums, watermelon. Fresh: banana, grapes, cantaloupe, avocado, cherries, pineapple, grapefruit, kiwi, nectarines, peaches, oranges, blueberries, plums. Keep servings limited to  cup or 1 piece.   Avoid: Fresh: apple with or without skin, apricots, mango, pears, raspberries, strawberries. Prune juice, stewed or dried prunes. Dried fruits, raisins, dates. Avoid large servings of all fresh fruits.  Meat and Meat Substitutes  Allowed:  Ground or well-cooked tender beef, ham, veal, lamb, pork, or poultry. Eggs, plain cheese. Fish, oysters, shrimp, lobster, other seafood. Liver, organ meats.   Avoid: Tough, fibrous meats with gristle. Peanut butter, smooth or chunky. Cheese with seeds, nuts, or other foods not allowed. Nuts, seeds, legumes, dried peas, beans, lentils.  Milk  Allowed:  All milk products except those not allowed. Milk and milk product consumption should be minimal when low residue is desired.   Avoid: Yogurt that contains nuts or seeds.  Soups and Combination Foods  Allowed:  Bouillon, broth, or cream soups made from allowed foods. Any strained soup. Casseroles or mixed dishes made with allowed foods.   Avoid: Soups made from vegetables that are not allowed or that contain other foods not allowed.  Desserts and Sweets  Allowed:  Plain cakes and cookies, pie made with allowed fruit, pudding, custard, cream pie. Gelatin, fruit, ice, sherbet, frozen ice pops. Ice cream, ice milk without nuts. Plain hard candy, honey, jelly, molasses, syrup, sugar, chocolate syrup, gumdrops, marshmallows.   Avoid: Desserts, cookies, or candies that contain nuts, peanut butter, or dried fruits. Jams, preserves with seeds, marmalade.  Fats and Oils  Allowed:  Margarine, butter, cream, mayonnaise, salad oils, plain salad dressings made from allowed foods. Plain gravy, crisp bacon without rind.   Avoid: Seeds, nuts,  olives. Avocados.  Beverages  Allowed:  All, except those listed to avoid.   Avoid: Fruit juices with high pulp, prune juice.  Condiments  Allowed:  Ketchup, mustard, horseradish, vinegar, cream sauce, cheese sauce, cocoa powder. Spices in moderation: allspice, basil, bay leaves, celery powder or leaves, cinnamon, cumin powder, curry powder, ginger, mace, marjoram, onion or garlic powder, oregano, paprika, parsley flakes, ground pepper, rosemary, sage, savory, tarragon, thyme, turmeric.   Avoid: Coconut, pickles.  SAMPLE MEAL PLAN The following menu is provided as a sample. Your daily menu plans will vary. Be sure to include a minimum of the following each day in order to provide essential nutrients for the adult:  Starch/Bread/Cereal Group, 6 servings.   Fruit/Vegetable Group, 5 servings.   Meat/Meat Substitute Group, 2 servings.   Milk/Milk Substitute Group, 2 servings.  A serving is equal to  cup for fruits, vegetables, and cooked cereals or 1 piece for foods such as a piece of bread, 1 orange, or 1 apple. For dry cereals and crackers, use serving sizes listed on the label. Combination foods may count as full or partial servings from various food groups. Fats, desserts, and sweets may be added to the meal plan after the requirements for essential nutrients are met. SAMPLE MENU Breakfast   cup orange juice.   1 boiled egg.   1 slice white toast.   Margarine.    cup cornflakes.   1 cup milk.   Beverage.  Lunch   cup chicken noodle soup.   2 to 3 oz sliced roast beef.   2 slices seedless rye bread.   Mayonnaise.    cup tomato juice.   1 small banana.   Beverage.  Dinner  3 oz baked chicken.  cup scalloped potatoes.    cup cooked beets.   White dinner roll.   Margarine.    cup canned peaches.   Beverage.  Document Released: 10/01/2001 Document Revised: 03/31/2011 Document Reviewed: 03/14/2011 [facility] Patient Information 2012 [place],  [place].   High Fiber Diet(Starting in 2 weeks) A high fiber diet changes your normal diet to include more whole grains, legumes, fruits, and vegetables. Changes in the diet involve replacing refined carbohydrates with unrefined foods. The calorie level of the diet is essentially unchanged. The Dietary Reference Intake (recommended amount) for adult males is 38 g per day. For adult females, it is 25 g per day. Pregnant and lactating women should consume 28 g of fiber per day. Fiber is the intact part of a plant that is not broken down during digestion. Functional fiber is fiber that has been isolated from the plant to provide a beneficial effect in the body. PURPOSE  Increase stool bulk.   Ease and regulate bowel movements.   Lower cholesterol.  INDICATIONS THAT YOU NEED MORE FIBER  Constipation and hemorrhoids.   Uncomplicated diverticulosis (intestine condition) and irritable bowel syndrome.   Weight management.   As a protective measure against hardening of the arteries (atherosclerosis), diabetes, and cancer.  NOTE OF CAUTION If you have a digestive or bowel problem, ask your caregiver for advice before adding high fiber foods to your diet. Some of the following medical problems are such that a high fiber diet should not be used without consulting your caregiver:  Acute diverticulitis (intestine infection).   Partial small bowel obstructions.   Complicated diverticular disease involving bleeding, rupture (perforation), or abscess (boil, furuncle).   Presence of autonomic neuropathy (nerve damage) or gastric paresis (stomach cannot empty itself).  GUIDELINES FOR INCREASING FIBER  Start adding fiber to the diet slowly. A gradual increase of about 5 more grams (2 slices of whole-wheat bread, 2 servings of most fruits or vegetables, or 1 bowl of high fiber cereal) per day is best. Too rapid an increase in fiber may result in constipation, flatulence, and bloating.   Drink enough water  and fluids to keep your urine clear or pale yellow. Water, juice, or caffeine-free drinks are recommended. Not drinking enough fluid may cause constipation.   Eat a variety of high fiber foods rather than one type of fiber.   Try to increase your intake of fiber through using high fiber foods rather than fiber pills or supplements that contain small amounts of fiber.   The goal is to change the types of food eaten. Do not supplement your present diet with high fiber foods, but replace foods in your present diet.  INCLUDE A VARIETY OF FIBER SOURCES  Replace refined and processed grains with whole grains, canned fruits with fresh fruits, and incorporate other fiber sources. White rice, white breads, and most bakery goods contain little or no fiber.   Brown whole-grain rice, buckwheat oats, and many fruits and vegetables are all good sources of fiber. These include: broccoli, Brussels sprouts, cabbage, cauliflower, beets, sweet potatoes, white potatoes (skin on), carrots, tomatoes, eggplant, squash, berries, fresh fruits, and dried fruits.   Cereals appear to be the richest source of fiber. Cereal fiber is found in whole grains and bran. Bran is the fiber-rich outer coat of cereal grain, which is largely removed in refining. In whole-grain cereals, the bran remains. In breakfast cereals, the largest amount of fiber is found in those with "bran" in their names. The fiber content is sometimes indicated  on the label.   You may need to include additional fruits and vegetables each day.   In baking, for 1 cup white flour, you may use the following substitutions:   1 cup whole-wheat flour minus 2 tbs.    cup white flour plus  cup whole-wheat flour.  Document Released: 04/11/2005 Document Revised: 03/31/2011 Document Reviewed: 02/17/2009 [facility] Patient Information 2012 [place], [place].

## 2011-12-21 NOTE — Progress Notes (Signed)
Primary Care Physician:  Fredirick Maudlin, MD Primary Gastroenterologist:  Dr. Jena Gauss  Chief Complaint  Patient presents with  . Abdominal Pain  . Diarrhea    HPI:  Mackenzie Mcpherson is a 61 y.o. female here for evaluation of RLQ pain that started 1 month ago.  She tells me it feels like when she had diverticulitis in 2010.  She was seen by Dr. Juanetta Gosling and treated with Cipro 500 mg twice a day for 10 days and Flagyl 500 mg 3 times a day for 10 days.  She finished cipro & flagyl about 2 weeks ago.  She developed diarrhea & lower abdominal cramping as she started taking the antibiotics.  She has been having 2-3 loose BMs daily.  She was started on align daily. She has a CT scan abdomen and pelvis scheduled Friday (2 days) by Dr. Juanetta Gosling.  He is not had a stool specimen for C. difficile. She denies N/V.  Her pain is intermittent 4/10 and lasts several minutes.  Her weight is stable.  Her appetite is fine. He has been an increase in heartburn & indigestion with episodes every other day.  She has a history of erosive reflux esophagitis and Schatzki's ring.  She takes omeprazole 20mg  prn usually once weekly.  Denies dysphagia or odynophagia.  C/o problems w/ tomato based products, grapefruit, & lemons.    Past Medical History  Diagnosis Date  . Hypertension   . Hypothyroidism   . Hemorrhoids   . Obesity   . Diverticulitis 2010    Past Surgical History  Procedure Date  . Abdominal hysterectomy 1986    w/ unilateral salpingoopherectomy (can't remember which one)  . Cholecystectomy   . Colonoscopy 11/19/2004    Normal rectum/Normal colon  . Esophagogastroduodenoscopy 08/15/2008    Moderate size hiatal hernia/Noncritical Schatzki's ring with superimposed distal esophageal erosion consistent with erosive reflux esophagitis  . Colonoscopy 12/11/09    Rourk-minimally friable anal canal/hemorrhoids otherwise normal  . Appendectomy     Current Outpatient Prescriptions  Medication Sig Dispense  Refill  . levothyroxine (SYNTHROID, LEVOTHROID) 50 MCG tablet Take 50 mcg by mouth daily.        Marland Kitchen losartan-hydrochlorothiazide (HYZAAR) 100-12.5 MG per tablet Take 1 tablet by mouth daily.        . Multiple Vitamins-Minerals (WOMENS MULTIVITAMIN PLUS) TABS Take 1 tablet by mouth daily.      Marland Kitchen omeprazole (PRILOSEC) 20 MG capsule Take 1 capsule (20 mg total) by mouth daily.  30 capsule  5  . Probiotic Product (ALIGN) 4 MG CAPS Take 4 mg by mouth daily.      Marland Kitchen DISCONTD: omeprazole (PRILOSEC) 20 MG capsule Take 20 mg by mouth as needed.         Allergies as of 12/21/2011 - Review Complete 12/21/2011  Allergen Reaction Noted  . Pantoprazole sodium Anaphylaxis and Hives     Family History  Problem Relation Age of Onset  . Stroke Mother   . Diabetes Mother   . Hypertension Father   . Colon cancer Brother 35  . Colon cancer Brother 37    History   Social History  . Marital Status: Married    Spouse Name: N/A    Number of Children: 0  . Years of Education: N/A   Occupational History  . owns [employer], retired [employer]    Social History Main Topics  . Smoking status: Never Smoker   . Smokeless tobacco: Not on file  . Alcohol Use: No  .  Drug Use: No  . Sexually Active: Not on file   Review of Systems: Gen: Denies any fever, chills, sweats, anorexia, fatigue, weakness, malaise, weight loss, and sleep disorder CV: Denies chest pain, angina, palpitations, syncope, orthopnea, PND, peripheral edema, and claudication. Resp: Denies dyspnea at rest, dyspnea with exercise, cough, sputum, wheezing, coughing up blood, and pleurisy. GI: Denies vomiting blood, jaundice, and fecal incontinence.   GU : Denies urinary burning, blood in urine, urinary frequency, urinary hesitancy, nocturnal urination, and urinary incontinence. MS: Denies joint pain, limitation of movement, and swelling, stiffness, low back pain, extremity pain. Denies muscle weakness, cramps, atrophy.  Derm:  Denies rash, itching, dry skin, hives, moles, warts, or unhealing ulcers.  Psych: Denies depression, anxiety, memory loss, suicidal ideation, hallucinations, paranoia, and confusion. Heme: Denies bruising, bleeding, and enlarged lymph nodes. Neuro:  Denies any headaches, dizziness, paresthesias. Endo:  Denies any problems with DM, thyroid, adrenal function.  Physical Exam: BP 114/69  Pulse 66  Temp 97.2 F (36.2 C) (Temporal)  Ht 4\' 11"  (1.499 m)  Wt 161 lb 3.2 oz (73.12 kg)  BMI 32.56 kg/m2 No LMP recorded. Patient has had a hysterectomy. General:   Alert,  Well-developed, well-nourished, pleasant and cooperative in NAD. Head:  Normocephalic and atraumatic. Eyes:  Sclera clear, no icterus.   Conjunctiva pink. Ears:  Normal auditory acuity. Nose:  No deformity, discharge, or lesions. Mouth:  No deformity or lesions,oropharynx pink & moist. Neck:  Supple; no masses or thyromegaly. Lungs:  Clear throughout to auscultation.   No wheezes, crackles, or rhonchi. No acute distress. Heart:  Regular rate and rhythm; no murmurs, clicks, rubs,  or gallops. Abdomen:  Normal bowel sounds.  No bruits.  Soft,non-distended.  Mild right lower quadrant and left lower quadrant tenderness with moderate tenderness around umbilicus. No masses, hepatosplenomegaly or hernias noted.  No guarding or rebound tenderness.   Rectal:  Deferred.  Msk:  Symmetrical without gross deformities. Normal posture. Pulses:  Normal pulses noted. Extremities:  No clubbing or edema. Neurologic:  Alert and  oriented x4;  grossly normal neurologically. Skin:  Intact without significant lesions or rashes. Lymph Nodes:  No significant cervical adenopathy. Psych:  Alert and cooperative. Normal mood and affect.

## 2011-12-21 NOTE — Assessment & Plan Note (Addendum)
See abdominal pain Low fiber diet for the next 2 weeks, then high fiber diet Handouts given on low and high fiber diet

## 2011-12-21 NOTE — Assessment & Plan Note (Addendum)
Heartburn and indigestion several times per week. History of erosive reflux esophagitis and Schatzki's ring. Only using omeprazole sparingly at this time. Previous interaction with Protonix, however no problems with omeprazole. Begin omeprazole 20mg  daily for acid reflux for next 30 day trial

## 2011-12-21 NOTE — Assessment & Plan Note (Signed)
See "abdominal pain"

## 2011-12-22 NOTE — Progress Notes (Signed)
Quick Note:  Await CT. ______ 

## 2011-12-23 ENCOUNTER — Ambulatory Visit (HOSPITAL_COMMUNITY)
Admission: RE | Admit: 2011-12-23 | Discharge: 2011-12-23 | Disposition: A | Discharge status: Home / Self Care | Payer: Federal, State, Local not specified - PPO | Source: Ambulatory Visit | Attending: Pulmonary Disease | Admitting: Pulmonary Disease

## 2011-12-23 DIAGNOSIS — R1031 Right lower quadrant pain: Secondary | ICD-10-CM | POA: Insufficient documentation

## 2011-12-23 DIAGNOSIS — R109 Unspecified abdominal pain: Secondary | ICD-10-CM

## 2011-12-23 DIAGNOSIS — I10 Essential (primary) hypertension: Secondary | ICD-10-CM | POA: Insufficient documentation

## 2011-12-23 MED ORDER — IOHEXOL 300 MG/ML  SOLN
100.0000 mL | Freq: Once | INTRAMUSCULAR | Status: AC | PRN
  Administered 2011-12-23: 100 mL via INTRAVENOUS

## 2011-12-23 NOTE — Progress Notes (Signed)
Quick Note:  Please call patient and let her know that her CT shows constipation. Her stool study was negative for C. Difficile. She should begin MiraLax 17 g daily. Hold if she develops more than 2-3 loose stools per day. Office visit in 6 weeks for followup with me. CC: HAWKINS,EDWARD L, MD  ______

## 2011-12-27 NOTE — Progress Notes (Signed)
Faxed to PCP

## 2011-12-28 NOTE — Progress Notes (Signed)
Quick Note:    Pt given results  ______

## 2012-03-12 ENCOUNTER — Other Ambulatory Visit (HOSPITAL_COMMUNITY): Payer: Self-pay | Admitting: Pulmonary Disease

## 2012-03-12 DIAGNOSIS — Z139 Encounter for screening, unspecified: Secondary | ICD-10-CM

## 2012-04-09 ENCOUNTER — Ambulatory Visit (HOSPITAL_COMMUNITY)
Admission: RE | Admit: 2012-04-09 | Discharge: 2012-04-09 | Disposition: A | Discharge status: Home / Self Care | Payer: Federal, State, Local not specified - PPO | Source: Ambulatory Visit | Attending: Pulmonary Disease | Admitting: Pulmonary Disease

## 2012-04-09 DIAGNOSIS — Z139 Encounter for screening, unspecified: Secondary | ICD-10-CM

## 2012-04-09 DIAGNOSIS — Z1231 Encounter for screening mammogram for malignant neoplasm of breast: Secondary | ICD-10-CM | POA: Insufficient documentation

## 2013-04-08 ENCOUNTER — Other Ambulatory Visit (HOSPITAL_COMMUNITY): Payer: Self-pay | Admitting: Pulmonary Disease

## 2013-04-08 DIAGNOSIS — Z139 Encounter for screening, unspecified: Secondary | ICD-10-CM

## 2013-04-12 ENCOUNTER — Ambulatory Visit (HOSPITAL_COMMUNITY)
Admission: RE | Admit: 2013-04-12 | Discharge: 2013-04-12 | Disposition: A | Discharge status: Home / Self Care | Payer: Federal, State, Local not specified - PPO | Source: Ambulatory Visit | Attending: Pulmonary Disease | Admitting: Pulmonary Disease

## 2013-04-12 DIAGNOSIS — Z1231 Encounter for screening mammogram for malignant neoplasm of breast: Secondary | ICD-10-CM | POA: Insufficient documentation

## 2013-04-12 DIAGNOSIS — Z139 Encounter for screening, unspecified: Secondary | ICD-10-CM

## 2013-11-11 ENCOUNTER — Ambulatory Visit (HOSPITAL_COMMUNITY)
Admission: RE | Admit: 2013-11-11 | Discharge: 2013-11-11 | Disposition: A | Discharge status: Home / Self Care | Payer: Federal, State, Local not specified - PPO | Source: Ambulatory Visit | Attending: Pulmonary Disease | Admitting: Pulmonary Disease

## 2013-11-11 ENCOUNTER — Other Ambulatory Visit (HOSPITAL_COMMUNITY): Payer: Self-pay | Admitting: Pulmonary Disease

## 2013-11-11 DIAGNOSIS — R5383 Other fatigue: Secondary | ICD-10-CM

## 2013-11-11 DIAGNOSIS — R509 Fever, unspecified: Secondary | ICD-10-CM

## 2013-11-11 DIAGNOSIS — R5381 Other malaise: Secondary | ICD-10-CM | POA: Insufficient documentation

## 2013-11-11 DIAGNOSIS — R6883 Chills (without fever): Secondary | ICD-10-CM | POA: Insufficient documentation

## 2014-03-28 ENCOUNTER — Other Ambulatory Visit (HOSPITAL_COMMUNITY): Payer: Self-pay | Admitting: Pulmonary Disease

## 2014-03-28 DIAGNOSIS — Z1231 Encounter for screening mammogram for malignant neoplasm of breast: Secondary | ICD-10-CM

## 2014-04-14 ENCOUNTER — Ambulatory Visit (HOSPITAL_COMMUNITY)
Admission: RE | Admit: 2014-04-14 | Discharge: 2014-04-14 | Disposition: A | Discharge status: Home / Self Care | Payer: Federal, State, Local not specified - PPO | Source: Ambulatory Visit | Attending: Pulmonary Disease | Admitting: Pulmonary Disease

## 2014-04-14 DIAGNOSIS — Z1231 Encounter for screening mammogram for malignant neoplasm of breast: Secondary | ICD-10-CM | POA: Diagnosis not present

## 2014-04-28 ENCOUNTER — Encounter: Payer: Self-pay | Admitting: Internal Medicine

## 2014-05-20 ENCOUNTER — Encounter: Payer: Self-pay | Admitting: Nurse Practitioner

## 2014-05-20 ENCOUNTER — Other Ambulatory Visit: Payer: Self-pay

## 2014-05-20 ENCOUNTER — Ambulatory Visit (INDEPENDENT_AMBULATORY_CARE_PROVIDER_SITE_OTHER): Payer: Federal, State, Local not specified - PPO | Admitting: Nurse Practitioner

## 2014-05-20 VITALS — BP 136/74 | HR 82 | Temp 97.3°F | Ht 59.0 in | Wt 166.0 lb

## 2014-05-20 DIAGNOSIS — K219 Gastro-esophageal reflux disease without esophagitis: Secondary | ICD-10-CM

## 2014-05-20 DIAGNOSIS — R1310 Dysphagia, unspecified: Secondary | ICD-10-CM

## 2014-05-20 DIAGNOSIS — Q394 Esophageal web: Secondary | ICD-10-CM

## 2014-05-20 DIAGNOSIS — R1314 Dysphagia, pharyngoesophageal phase: Secondary | ICD-10-CM

## 2014-05-20 DIAGNOSIS — K2100 Gastro-esophageal reflux disease with esophagitis, without bleeding: Secondary | ICD-10-CM

## 2014-05-20 DIAGNOSIS — K222 Esophageal obstruction: Secondary | ICD-10-CM | POA: Insufficient documentation

## 2014-05-20 MED ORDER — OMEPRAZOLE 20 MG PO CPDR: 20.0000 mg | DELAYED_RELEASE_CAPSULE | Freq: Every day | ORAL | Status: DC

## 2014-05-20 NOTE — Assessment & Plan Note (Addendum)
History of Schatzki's ring last disrupted in 2010 with good result and symptom relief. Solid food dysphagia symptoms returning over the past 8 months, no pill dysphagia. Likely recurrent Schatzki's ring given history. Will schedule for EGD.  Proceed with EGD +/- dilation with Dr. Gala Romney in near future: the risks, benefits, and alternatives have been discussed with the patient in detail. The patient states understanding and desires to proceed.

## 2014-05-20 NOTE — Progress Notes (Signed)
cc'ed to pcp °

## 2014-05-20 NOTE — Patient Instructions (Signed)
1. We will schedule your endoscopy with possible stretching for you 2. I am resending your prescription for Omeprazole for you 3. Recommend repeat colonoscopy in 7 years after your last one (2 years from now, or 2018) unless you begin to have any symptoms such as blood in your stool, abdominal pain, change in bowel habits, unintentional weight loss, etc. 4. Further recommendations pending the results of your procedure.

## 2014-05-20 NOTE — Assessment & Plan Note (Signed)
Stopped taking PPI about a year ago. GERD symptoms increasing in frequency and severity, was well controlled on Omeprazole 20mg  qd. History of erosive esophagitis on endoscopy. Will restart omeprazole 20mg  daily. Of note, she has a listed allergy to Protonix but she states this is the only PPI she has an allergy to and has taken prilosec without issue.

## 2014-05-20 NOTE — Progress Notes (Signed)
Referring Provider: Alonza Bogus, MD Primary Care Physician:  Alonza Bogus, MD Primary GI: Dr. Gala Romney  Chief Complaint  Patient presents with  . Dysphagia    HPI:   64 year old female presents from Dr. Luan Pulling for dysphagia. Has a history of GERD on Omeprazole 20 mg daily, erosive esophagitis, and Schatzki's ring. Last EGD 2010 with a non-tight ring which was not disrupted with a 54 Fr dilator. Disruption was achieved by strategically taking 2 bites out of the ring with jumbo biopsy foreceps. Last colonoscopy 2011 minimally friable anal canal/hemorrhoids otherwise normal.  After last EGD and ring disruption she did well and symptoms were resolved temporarily. Symptoms returned sometime last year. When she eats solid food it feels like it's getting stuck and she has to cough until she can cough it up. Problems occur 2-3 times a week, worst with lettuce, certain vegetables, and meat. Occasional problems with liquids as well. Denies pill dysphagia. GERD symptoms have been well-controlled as long as she doesn't eat too close to bedtime. However, she does occasionally have a flare up. Takes TUMS with flares, which helps somewhat although she finds she's had to use them more and more. Stopped taking Prilosec about 1 year ago. Denies hematochezia and melena. Stool hemoccult test recently done with PCP which was negative. Has a bowel movements 2-3 times a day which is normal for her. Denies any other upper or lower GI problems.  Past Medical History  Diagnosis Date  . Hypertension   . Hypothyroidism   . Hemorrhoids   . Diverticulitis 2010  . Schatzki's ring     last dilation 2010  . Hiatal hernia   . GERD (gastroesophageal reflux disease)     errosive esophagitis in 2010    Past Surgical History  Procedure Laterality Date  . Abdominal hysterectomy  1986    w/ unilateral salpingoopherectomy (can't remember which one)  . Cholecystectomy    . Colonoscopy  11/19/2004    Normal  rectum/Normal colon  . Esophagogastroduodenoscopy  08/15/2008    Moderate size hiatal hernia/Noncritical Schatzki's ring with superimposed distal esophageal erosion consistent with erosive reflux esophagitis  . Colonoscopy  12/11/09    Rourk-minimally friable anal canal/hemorrhoids otherwise normal  . Appendectomy      Current Outpatient Prescriptions  Medication Sig Dispense Refill  . levothyroxine (SYNTHROID, LEVOTHROID) 50 MCG tablet Take 50 mcg by mouth daily.      Marland Kitchen losartan-hydrochlorothiazide (HYZAAR) 100-12.5 MG per tablet Take 1 tablet by mouth daily.      . Multiple Vitamins-Minerals (WOMENS MULTIVITAMIN PLUS) TABS Take 1 tablet by mouth daily.    . Probiotic Product (ALIGN) 4 MG CAPS Take 4 mg by mouth daily.     No current facility-administered medications for this visit.    Allergies as of 05/20/2014 - Review Complete 05/20/2014  Allergen Reaction Noted  . Pantoprazole sodium Anaphylaxis and Hives     Family History  Problem Relation Age of Onset  . Stroke Mother   . Diabetes Mother   . Hypertension Father   . Colon cancer Brother 75  . Colon cancer Brother 26    History   Social History  . Marital Status: Married    Spouse Name: N/A    Number of Children: 0  . Years of Education: N/A   Occupational History  . owns [employer], retired [place] [employer]    Social History Main Topics  . Smoking status: Never Smoker   . Smokeless tobacco: None  .  Alcohol Use: No  . Drug Use: No  . Sexual Activity: None   Other Topics Concern  . None   Social History Narrative    Review of Systems: Gen: Denies fever, chills, anorexia. Denies fatigue, weakness, weight loss.  CV: Denies chest pain, palpitations, syncope. Resp: Denies dyspnea at rest, wheezing, coughing up blood. GI: See HPI. Denies vomiting blood, jaundice. Denies odynophagia. Derm: Denies rash, itching, dry skin Psych: Denies depression, anxiety, memory loss, confusion.  Heme: Denies  bruising, bleeding, and enlarged lymph nodes.  Physical Exam: BP 136/74 mmHg  Pulse 82  Temp(Src) 97.3 F (36.3 C) (Oral)  Ht 4\' 11"  (1.499 m)  Wt 166 lb (75.297 kg)  BMI 33.51 kg/m2 General:   Alert and oriented. No distress noted. Pleasant and cooperative.  Head:  Normocephalic and atraumatic. Eyes:  Conjuctiva clear without scleral icterus. Mouth:  Oral mucosa pink and moist. Good dentition. No lesions. No OP edema Lungs:  Clear to auscultation bilaterally. No wheezes, rales, or rhonchi. No distress.  Heart:  S1, S2 present without murmurs, rubs, or gallops. Regular rate and rhythm. Abdomen:  +BS, soft, non-tender and non-distended. No rebound or guarding. No HSM or masses noted. Pulses:  2+ DP noted bilaterally Extremities:  Without edema. Neurologic:  Alert and  oriented x4;  grossly normal neurologically. Skin:  Intact without significant lesions or rashes. Psych:  Alert and cooperative. Normal mood and affect.    05/20/2014 9:08 AM

## 2014-05-20 NOTE — Assessment & Plan Note (Addendum)
Increasing dysphagia symptoms for about the past 8 months with solid foods, occasionally luiquids. No pill dysphagia. Has a history of Schatzki's ring which was disrupted with jumbo forcepts in 2010 with relief of symptoms. Could be age-related dysmotility or GERD/esophagitis effect but more likely Schatzki's ring given her history. Will plan for repeat EGD with possible dilation to evaluate.  Proceed with EGD +/- dilation with Dr. Gala Romney in near future: the risks, benefits, and alternatives have been discussed with the patient in detail. The patient states understanding and desires to proceed.

## 2014-06-11 ENCOUNTER — Encounter (HOSPITAL_COMMUNITY)
Admission: RE | Disposition: A | Discharge status: Home / Self Care | Payer: Self-pay | Source: Ambulatory Visit | Attending: Internal Medicine

## 2014-06-11 ENCOUNTER — Ambulatory Visit (HOSPITAL_COMMUNITY)
Admission: RE | Admit: 2014-06-11 | Discharge: 2014-06-11 | Disposition: A | Discharge status: Home / Self Care | Payer: Federal, State, Local not specified - PPO | Source: Ambulatory Visit | Attending: Internal Medicine | Admitting: Internal Medicine

## 2014-06-11 ENCOUNTER — Encounter (HOSPITAL_COMMUNITY): Payer: Self-pay | Admitting: *Deleted

## 2014-06-11 DIAGNOSIS — K219 Gastro-esophageal reflux disease without esophagitis: Secondary | ICD-10-CM | POA: Insufficient documentation

## 2014-06-11 DIAGNOSIS — E039 Hypothyroidism, unspecified: Secondary | ICD-10-CM | POA: Insufficient documentation

## 2014-06-11 DIAGNOSIS — Z888 Allergy status to other drugs, medicaments and biological substances status: Secondary | ICD-10-CM | POA: Insufficient documentation

## 2014-06-11 DIAGNOSIS — Z9049 Acquired absence of other specified parts of digestive tract: Secondary | ICD-10-CM | POA: Insufficient documentation

## 2014-06-11 DIAGNOSIS — Q394 Esophageal web: Secondary | ICD-10-CM

## 2014-06-11 DIAGNOSIS — I10 Essential (primary) hypertension: Secondary | ICD-10-CM | POA: Diagnosis not present

## 2014-06-11 DIAGNOSIS — K222 Esophageal obstruction: Secondary | ICD-10-CM | POA: Diagnosis not present

## 2014-06-11 DIAGNOSIS — R1310 Dysphagia, unspecified: Secondary | ICD-10-CM

## 2014-06-11 DIAGNOSIS — K449 Diaphragmatic hernia without obstruction or gangrene: Secondary | ICD-10-CM | POA: Insufficient documentation

## 2014-06-11 DIAGNOSIS — Z90710 Acquired absence of both cervix and uterus: Secondary | ICD-10-CM | POA: Insufficient documentation

## 2014-06-11 DIAGNOSIS — R1314 Dysphagia, pharyngoesophageal phase: Secondary | ICD-10-CM

## 2014-06-11 HISTORY — PX: ESOPHAGOGASTRODUODENOSCOPY: SHX5428

## 2014-06-11 SURGERY — EGD (ESOPHAGOGASTRODUODENOSCOPY)
Anesthesia: Moderate Sedation

## 2014-06-11 MED ORDER — SODIUM CHLORIDE 0.9 % IV SOLN
INTRAVENOUS | Status: DC
  Administered 2014-06-11: 09:00:00 via INTRAVENOUS

## 2014-06-11 MED ORDER — STERILE WATER FOR IRRIGATION IR SOLN
Status: DC | PRN
  Administered 2014-06-11: 09:00:00

## 2014-06-11 MED ORDER — MEPERIDINE HCL 100 MG/ML IJ SOLN
INTRAMUSCULAR | Status: AC
  Filled 2014-06-11: qty 2

## 2014-06-11 MED ORDER — ONDANSETRON HCL 4 MG/2ML IJ SOLN
INTRAMUSCULAR | Status: DC | PRN
  Administered 2014-06-11: 4 mg via INTRAVENOUS

## 2014-06-11 MED ORDER — MEPERIDINE HCL 100 MG/ML IJ SOLN
INTRAMUSCULAR | Status: DC | PRN
  Administered 2014-06-11: 50 mg
  Administered 2014-06-11: 25 mg

## 2014-06-11 MED ORDER — LIDOCAINE VISCOUS 2 % MT SOLN
OROMUCOSAL | Status: DC | PRN
  Administered 2014-06-11: 4 mL via OROMUCOSAL

## 2014-06-11 MED ORDER — MIDAZOLAM HCL 5 MG/5ML IJ SOLN
INTRAMUSCULAR | Status: AC
  Filled 2014-06-11: qty 10

## 2014-06-11 MED ORDER — ONDANSETRON HCL 4 MG/2ML IJ SOLN
INTRAMUSCULAR | Status: AC
  Filled 2014-06-11: qty 2

## 2014-06-11 MED ORDER — LIDOCAINE VISCOUS 2 % MT SOLN
OROMUCOSAL | Status: AC
  Filled 2014-06-11: qty 15

## 2014-06-11 MED ORDER — MIDAZOLAM HCL 5 MG/5ML IJ SOLN
INTRAMUSCULAR | Status: DC | PRN
  Administered 2014-06-11: 2 mg via INTRAVENOUS
  Administered 2014-06-11: 1 mg via INTRAVENOUS

## 2014-06-11 NOTE — H&P (View-Only) (Signed)
Referring Provider: Alonza Bogus, MD Primary Care Physician:  Alonza Bogus, MD Primary GI: Dr. Gala Romney  Chief Complaint  Patient presents with  . Dysphagia    HPI:   64 year old Mcpherson presents from Dr. Luan Pulling for dysphagia. Has a history of GERD on Omeprazole 20 mg daily, erosive esophagitis, and Schatzki's ring. Last EGD 2010 with a non-tight ring which was not disrupted with a 54 Fr dilator. Disruption was achieved by strategically taking 2 bites out of the ring with jumbo biopsy foreceps. Last colonoscopy 2011 minimally friable anal canal/hemorrhoids otherwise normal.  After last EGD and ring disruption she did well and symptoms were resolved temporarily. Symptoms returned sometime last year. When she eats solid food it feels like it's getting stuck and she has to cough until she can cough it up. Problems occur 2-3 times a week, worst with lettuce, certain vegetables, and meat. Occasional problems with liquids as well. Denies pill dysphagia. GERD symptoms have been well-controlled as long as she doesn't eat too close to bedtime. However, she does occasionally have a flare up. Takes TUMS with flares, which helps somewhat although she finds she's had to use them more and more. Stopped taking Prilosec about 1 year ago. Denies hematochezia and melena. Stool hemoccult test recently done with PCP which was negative. Has a bowel movements 2-3 times a day which is normal for her. Denies any other upper or lower GI problems.  Past Medical History  Diagnosis Date  . Hypertension   . Hypothyroidism   . Hemorrhoids   . Diverticulitis 2010  . Schatzki's ring     last dilation 2010  . Hiatal hernia   . GERD (gastroesophageal reflux disease)     errosive esophagitis in 2010    Past Surgical History  Procedure Laterality Date  . Abdominal hysterectomy  1986    w/ unilateral salpingoopherectomy (can't remember which one)  . Cholecystectomy    . Colonoscopy  11/19/2004    Normal  rectum/Normal colon  . Esophagogastroduodenoscopy  08/15/2008    Moderate size hiatal hernia/Noncritical Schatzki's ring with superimposed distal esophageal erosion consistent with erosive reflux esophagitis  . Colonoscopy  12/11/09    Rourk-minimally friable anal canal/hemorrhoids otherwise normal  . Appendectomy      Current Outpatient Prescriptions  Medication Sig Dispense Refill  . levothyroxine (SYNTHROID, LEVOTHROID) 50 MCG tablet Take 50 mcg by mouth daily.      Marland Kitchen losartan-hydrochlorothiazide (HYZAAR) 100-12.5 MG per tablet Take 1 tablet by mouth daily.      . Multiple Vitamins-Minerals (WOMENS MULTIVITAMIN PLUS) TABS Take 1 tablet by mouth daily.    . Probiotic Product (ALIGN) 4 MG CAPS Take 4 mg by mouth daily.     No current facility-administered medications for this visit.    Allergies as of 05/20/2014 - Review Complete 05/20/2014  Allergen Reaction Noted  . Pantoprazole sodium Anaphylaxis and Hives     Family History  Problem Relation Age of Onset  . Stroke Mother   . Diabetes Mother   . Hypertension Father   . Colon cancer Brother 36  . Colon cancer Brother 65    History   Social History  . Marital Status: Married    Spouse Name: N/A    Number of Children: 0  . Years of Education: N/A   Occupational History  . owns [employer], retired [place] [employer]    Social History Main Topics  . Smoking status: Never Smoker   . Smokeless tobacco: None  .  Alcohol Use: No  . Drug Use: No  . Sexual Activity: None   Other Topics Concern  . None   Social History Narrative    Review of Systems: Gen: Denies fever, chills, anorexia. Denies fatigue, weakness, weight loss.  CV: Denies chest pain, palpitations, syncope. Resp: Denies dyspnea at rest, wheezing, coughing up blood. GI: See HPI. Denies vomiting blood, jaundice. Denies odynophagia. Derm: Denies rash, itching, dry skin Psych: Denies depression, anxiety, memory loss, confusion.  Heme: Denies  bruising, bleeding, and enlarged lymph nodes.  Physical Exam: BP 136/Mackenzie mmHg  Pulse 82  Temp(Src) 97.3 F (36.3 C) (Oral)  Ht 4\' 11"  (1.499 m)  Wt 166 lb (75.297 kg)  BMI 33.51 kg/m2 General:   Alert and oriented. No distress noted. Pleasant and cooperative.  Head:  Normocephalic and atraumatic. Eyes:  Conjuctiva clear without scleral icterus. Mouth:  Oral mucosa pink and moist. Good dentition. No lesions. No OP edema Lungs:  Clear to auscultation bilaterally. No wheezes, rales, or rhonchi. No distress.  Heart:  S1, S2 present without murmurs, rubs, or gallops. Regular rate and rhythm. Abdomen:  +BS, soft, non-tender and non-distended. No rebound or guarding. No HSM or masses noted. Pulses:  2+ DP noted bilaterally Extremities:  Without edema. Neurologic:  Alert and  oriented x4;  grossly normal neurologically. Skin:  Intact without significant lesions or rashes. Psych:  Alert and cooperative. Normal mood and affect.    05/20/2014 9:08 AM

## 2014-06-11 NOTE — Op Note (Signed)
[facility] [address] [place], [address]   ENDOSCOPY PROCEDURE REPORT  PATIENT: Mackenzie, Mcpherson  MR#: [id number] BIRTHDATE: 1950-11-16 , 57  yrs. old GENDER: female ENDOSCOPIST: R.  Garfield Cornea, MD FACP FACG REFERRED BY:  Sinda Du, M.D. PROCEDURE DATE:  09-Jul-2014 PROCEDURE:  EGD, diagnostic and Maloney dilation of esophagus INDICATIONS:  Recurrent esophageal dysphagia; recurrent GERD off PPI.; Known Schatzki's ring. MEDICATIONS: Versed 3 mg IV and Demerol 75 mg IV in divided doses. Xylocaine gel orally.  Zofran 4 mg IV. ASA CLASS:      Class II  CONSENT: The risks, benefits, limitations, alternatives and imponderables have been discussed.  The potential for biopsy, esophogeal dilation, etc. have also been reviewed.  Questions have been answered.  All parties agreeable.  Please see the history and physical in the medical record for more information.  DESCRIPTION OF PROCEDURE: After the risks benefits and alternatives of the procedure were thoroughly explained, informed consent was obtained.  The EG-[id number] ([id number]) endoscope was introduced through the mouth and advanced to the second portion of the duodenum , limited by Without limitations. The instrument was slowly withdrawn as the mucosa was fully examined.    Noncritical- appearing Schatzki's ring; single tiny distal esophageal erosion.  No Barrett's esophagus.  No nodularity or tumor.  Stomach empty.  Moderate size hiatal hernia.  Gastric mucosa normal-appearing otherwise.  Patent pylorus.  Normal first and second portion of the duodenum.  Scope withdrawn, a 56 French Maloney dilators passed a full insertion with mild to moderate resistance.  Her hypopharynx was small.  48 French dilator pretty much filled up this space with passage.  A look back revealed a superficial tear through the upper esophageal sphincter mucosa.  The distal ring remained intact. Subsequently, four-quadrant  "bites" with biopsy forceps of the ring were taken to disrupt it.  This was done effectively without apparent complication.  Retroflexed views revealed as previously described.     The scope was then withdrawn from the patient and the procedure completed.  COMPLICATIONS: There were no immediate complications.  ENDOSCOPIC IMPRESSION: Schatzki's ring dilated and disrupted as described above. Single distal esophageal erosion consistent with mild erosive reflux esophagitis. Hiatal hernia.  RECOMMENDATIONS: Resume PPI?"omeprazole 40 mg daily. Discussed the chronic nature of GERD and the need to take medication daily indefinitely even though she may feel well while taking it. The benefits of acid suppression therapy outweigh the risks. Chloraseptic spray for any transient sore throat she may experience.  Office visit with [place] in 6 months.  REPEAT EXAM:  eSigned:  R. Garfield Cornea, MD Rosalita Chessman [facility] 07/09/2014 10:02 AM    CC:  CPT CODES: ICD CODES:  The ICD and CPT codes recommended by this software are interpretations from the data that the clinical staff has captured with the software.  The verification of the translation of this report to the ICD and CPT codes and modifiers is the sole responsibility of the health care institution and practicing physician where this report was generated.  [employer] will not be held responsible for the validity of the ICD and CPT codes included on this report.  AMA assumes no liability for data contained or not contained herein. CPT is a Designer, television/film set of the [employer].  PATIENT NAME:  Mackenzie, Mcpherson MR#: [id number]

## 2014-06-11 NOTE — Interval H&P Note (Signed)
History and Physical Interval Note:  06/11/2014 9:34 AM  Mackenzie Mcpherson  has presented today for surgery, with the diagnosis of gerd/dysphagia  The various methods of treatment have been discussed with the patient and family. After consideration of risks, benefits and other options for treatment, the patient has consented to  Procedure(s) with comments: ESOPHAGOGASTRODUODENOSCOPY (EGD) (N/A) - 161 as a surgical intervention .  The patient's history has been reviewed, patient examined, no change in status, stable for surgery.  I have reviewed the patient's chart and labs.  Questions were answered to the patient's satisfaction.     Mackenzie Mcpherson  No change. EGD with esophageal dilation as feasible/appropriate.The risks, benefits, limitations, alternatives and imponderables have been reviewed with the patient. Potential for esophageal dilation, biopsy, etc. have also been reviewed.  Questions have been answered. All parties agreeable.

## 2014-06-11 NOTE — Discharge Instructions (Signed)
EGD Discharge instructions Please read the instructions outlined below and refer to this sheet in the next few weeks. These discharge instructions provide you with general information on caring for yourself after you leave the hospital. Your doctor may also give you specific instructions. While your treatment has been planned according to the most current medical practices available, unavoidable complications occasionally occur. If you have any problems or questions after discharge, please call your doctor. ACTIVITY  You may resume your regular activity but move at a slower pace for the next 24 hours.   Take frequent rest periods for the next 24 hours.   Walking will help expel (get rid of) the air and reduce the bloated feeling in your abdomen.   No driving for 24 hours (because of the anesthesia (medicine) used during the test).   You may shower.   Do not sign any important legal documents or operate any machinery for 24 hours (because of the anesthesia used during the test).  NUTRITION  Drink plenty of fluids.   You may resume your normal diet.   Begin with a light meal and progress to your normal diet.   Avoid alcoholic beverages for 24 hours or as instructed by your caregiver.  MEDICATIONS  You may resume your normal medications unless your caregiver tells you otherwise.  WHAT YOU CAN EXPECT TODAY  You may experience abdominal discomfort such as a feeling of fullness or gas pains.  FOLLOW-UP  Your doctor will discuss the results of your test with you.  SEEK IMMEDIATE MEDICAL ATTENTION IF ANY OF THE FOLLOWING OCCUR:  Excessive nausea (feeling sick to your stomach) and/or vomiting.   Severe abdominal pain and distention (swelling).   Trouble swallowing.   Temperature over 101 F (37.8 C).   Rectal bleeding or vomiting of blood.    Begin omeprazole 40 mg daily  Use Chloraseptic Spray for any temporary sore throat you may experience  Office visit with [place] in 6  months

## 2014-06-12 ENCOUNTER — Encounter (HOSPITAL_COMMUNITY): Payer: Self-pay | Admitting: Internal Medicine

## 2014-09-21 ENCOUNTER — Encounter (HOSPITAL_COMMUNITY): Payer: Self-pay | Admitting: *Deleted

## 2014-09-21 ENCOUNTER — Emergency Department (HOSPITAL_COMMUNITY): Payer: Federal, State, Local not specified - PPO

## 2014-09-21 ENCOUNTER — Emergency Department (HOSPITAL_COMMUNITY)
Admission: EM | Admit: 2014-09-21 | Discharge: 2014-09-21 | Disposition: A | Discharge status: Home / Self Care | Payer: Federal, State, Local not specified - PPO | Attending: Emergency Medicine | Admitting: Emergency Medicine

## 2014-09-21 DIAGNOSIS — Z79899 Other long term (current) drug therapy: Secondary | ICD-10-CM | POA: Diagnosis not present

## 2014-09-21 DIAGNOSIS — Z87738 Personal history of other specified (corrected) congenital malformations of digestive system: Secondary | ICD-10-CM | POA: Diagnosis not present

## 2014-09-21 DIAGNOSIS — E039 Hypothyroidism, unspecified: Secondary | ICD-10-CM | POA: Diagnosis not present

## 2014-09-21 DIAGNOSIS — K219 Gastro-esophageal reflux disease without esophagitis: Secondary | ICD-10-CM | POA: Insufficient documentation

## 2014-09-21 DIAGNOSIS — Z792 Long term (current) use of antibiotics: Secondary | ICD-10-CM | POA: Insufficient documentation

## 2014-09-21 DIAGNOSIS — I10 Essential (primary) hypertension: Secondary | ICD-10-CM | POA: Insufficient documentation

## 2014-09-21 DIAGNOSIS — J40 Bronchitis, not specified as acute or chronic: Secondary | ICD-10-CM | POA: Diagnosis not present

## 2014-09-21 DIAGNOSIS — R05 Cough: Secondary | ICD-10-CM | POA: Diagnosis present

## 2014-09-21 MED ORDER — ALBUTEROL SULFATE HFA 108 (90 BASE) MCG/ACT IN AERS
2.0000 | INHALATION_SPRAY | Freq: Once | RESPIRATORY_TRACT | Status: AC
  Administered 2014-09-21: 2 via RESPIRATORY_TRACT
  Filled 2014-09-21: qty 6.7

## 2014-09-21 MED ORDER — IPRATROPIUM-ALBUTEROL 0.5-2.5 (3) MG/3ML IN SOLN
3.0000 mL | Freq: Once | RESPIRATORY_TRACT | Status: AC
  Administered 2014-09-21: 3 mL via RESPIRATORY_TRACT
  Filled 2014-09-21: qty 3

## 2014-09-21 NOTE — ED Notes (Signed)
Pt co cough and congestion since Tuesday, went to PCP on Wednesday, told she bronchitis, pt states her coughing has progressed since seeing PCP, cough productive, yellow.

## 2014-09-21 NOTE — ED Notes (Signed)
MD at bedside. 

## 2014-10-02 NOTE — ED Provider Notes (Signed)
CSN: [id number]     Arrival date & time 09/21/14  1602 History   First MD Initiated Contact with Patient 09/21/14 1638     Chief Complaint  Patient presents with  . Cough     (Consider location/radiation/quality/duration/timing/severity/associated sxs/prior Treatment) HPI   64yF with cough. Persistent since Tuesday. Says pcp for same recently and diagnosed with bronchitis. Has been taking abx, steroids and prn cough meds without much relief. Cough is very aggravating. Worse at night. Productive at times. No fever or chills. No unusual leg pain or swelling. No cp.   Past Medical History  Diagnosis Date  . Hypertension   . Hypothyroidism   . Hemorrhoids   . Diverticulitis 2010  . Schatzki's ring     last dilation 2010  . Hiatal hernia   . GERD (gastroesophageal reflux disease)     errosive esophagitis in 2010   Past Surgical History  Procedure Laterality Date  . Abdominal hysterectomy  1986    w/ unilateral salpingoopherectomy (can't remember which one)  . Cholecystectomy    . Colonoscopy  11/19/2004    Normal rectum/Normal colon  . Esophagogastroduodenoscopy  08/15/2008    Moderate size hiatal hernia/Noncritical Schatzki's ring with superimposed distal esophageal erosion consistent with erosive reflux esophagitis  . Colonoscopy  12/11/09    Rourk-minimally friable anal canal/hemorrhoids otherwise normal  . Appendectomy    . Esophagogastroduodenoscopy N/A 06/11/2014    Procedure: ESOPHAGOGASTRODUODENOSCOPY (EGD);  Surgeon: Daneil Dolin, MD;  Location: [facility];  Service: Endoscopy;  Laterality: N/A;  35   Family History  Problem Relation Age of Onset  . Stroke Mother   . Diabetes Mother   . Hypertension Father   . Colon cancer Brother 66  . Colon cancer Brother 53   History  Substance Use Topics  . Smoking status: Never Smoker   . Smokeless tobacco: Not on file  . Alcohol Use: No   OB History    No data available     Review of Systems  All systems  reviewed and negative, other than as noted in HPI.   Allergies  Pantoprazole sodium  Home Medications   Prior to Admission medications   Medication Sig Start Date End Date Taking? Authorizing Provider  HYDROcodone-homatropine (HYCODAN) 5-1.5 MG/5ML syrup Take 5 mLs by mouth 4 (four) times daily.   Yes Historical Provider, MD  levofloxacin (LEVAQUIN) 500 MG tablet Take 500 mg by mouth daily.   Yes Historical Provider, MD  levothyroxine (SYNTHROID, LEVOTHROID) 50 MCG tablet Take 50 mcg by mouth daily.     Yes Historical Provider, MD  losartan-hydrochlorothiazide (HYZAAR) 100-12.5 MG per tablet Take 1 tablet by mouth daily.     Yes Historical Provider, MD  methylPREDNISolone (MEDROL DOSEPAK) 4 MG TBPK tablet Take 4-24 mg by mouth as directed.   Yes Historical Provider, MD  Multiple Vitamins-Minerals (WOMENS MULTIVITAMIN PLUS) TABS Take 1 tablet by mouth daily.   Yes Historical Provider, MD  omeprazole (PRILOSEC) 40 MG capsule Take 40 mg by mouth daily.   Yes Historical Provider, MD  Probiotic Product (ALIGN) 4 MG CAPS Take 4 mg by mouth daily.   Yes Historical Provider, MD   BP 146/61 mmHg  Pulse 75  Temp(Src) 98.1 F (36.7 C) (Oral)  Resp 15  SpO2 96% Physical Exam  Constitutional: She appears well-developed and well-nourished. No distress.  HENT:  Head: Normocephalic and atraumatic.  Eyes: Conjunctivae are normal. Right eye exhibits no discharge. Left eye exhibits no discharge.  Neck: Neck supple.  Cardiovascular: Normal rate, regular rhythm and normal heart sounds.  Exam reveals no gallop and no friction rub.   No murmur heard. Pulmonary/Chest: Effort normal. No respiratory distress. She has wheezes.  Faint scattered expiratory wheezing  Abdominal: Soft. She exhibits no distension. There is no tenderness.  Musculoskeletal: She exhibits no edema or tenderness.  Lower extremities symmetric as compared to each other. No calf tenderness. Negative Homan's. No palpable cords.    Neurological: She is alert.  Skin: Skin is warm and dry.  Psychiatric: She has a normal mood and affect. Her behavior is normal. Thought content normal.  Nursing note and vitals reviewed.   ED Course  Procedures (including critical care time) Labs Review Labs Reviewed - No data to display  Imaging Review No results found.   Dg Chest 2 View  09/21/2014   CLINICAL DATA:  Productive cough for 5 days, initial encounter  EXAM: CHEST  2 VIEW  COMPARISON:  11/11/2013  FINDINGS: Cardiac shadow is mildly enlarged. Mild bibasilar atelectatic changes are seen without focal confluent infiltrate. No effusion is seen. No bony abnormality is noted.  IMPRESSION: Mild bibasilar atelectasis.   Electronically Signed   By: Inez Catalina M.D.   On: 09/21/2014 17:03    EKG Interpretation None      MDM   Final diagnoses:  Bronchitis    64yf with likely viral bronchitis. Appears well. No increased wob. Currently being tx'd for bronchitis. Will add prn inhaler. Discussed typical course and return precautions. It has been determined that no acute conditions requiring further emergency intervention are present at this time. The patient has been advised of the diagnosis and plan. I reviewed any labs and imaging including any potential incidental findings. We have discussed signs and symptoms that warrant return to the ED and they are listed in the discharge instructions.      Virgel Manifold, MD 10/02/14 1438

## 2014-10-20 ENCOUNTER — Other Ambulatory Visit: Payer: Self-pay

## 2014-10-29 ENCOUNTER — Encounter: Payer: Self-pay | Admitting: Internal Medicine

## 2014-11-10 ENCOUNTER — Encounter: Payer: Self-pay | Admitting: Internal Medicine

## 2014-12-22 ENCOUNTER — Ambulatory Visit (INDEPENDENT_AMBULATORY_CARE_PROVIDER_SITE_OTHER): Payer: Federal, State, Local not specified - PPO | Admitting: Nurse Practitioner

## 2014-12-22 ENCOUNTER — Other Ambulatory Visit: Payer: Self-pay

## 2014-12-22 ENCOUNTER — Encounter: Payer: Self-pay | Admitting: Nurse Practitioner

## 2014-12-22 VITALS — BP 138/68 | HR 69 | Temp 97.2°F | Ht 59.0 in | Wt 166.2 lb

## 2014-12-22 DIAGNOSIS — K2100 Gastro-esophageal reflux disease with esophagitis, without bleeding: Secondary | ICD-10-CM

## 2014-12-22 DIAGNOSIS — R1313 Dysphagia, pharyngeal phase: Secondary | ICD-10-CM

## 2014-12-22 DIAGNOSIS — Z800 Family history of malignant neoplasm of digestive organs: Secondary | ICD-10-CM | POA: Diagnosis not present

## 2014-12-22 MED ORDER — PEG-KCL-NACL-NASULF-NA ASC-C 100 G PO SOLR: 1.0000 | ORAL | Status: DC

## 2014-12-22 NOTE — Assessment & Plan Note (Signed)
Patient with a family history of colon cancer including 2 brothers, one diagnosed at age 66 and one at age 27. Her last colonoscopy in 2011 showed only friable anal canal and hemorrhoids. Recommend 5 year repeat due to her family history which makes her currently due. She is generally a symptomatically GI standpoint. We will proceed with colonoscopy as recommended  Proceed with TCS with Dr. Gala Romney in near future: the risks, benefits, and alternatives have been discussed with the patient in detail. The patient states understanding and desires to proceed.  The patient is not on any anticoagulants, anxiolytics, antidepressants, her chronic pain medications. Her endoscopy was successfully completed with conscious sedation initiated again be adequate for her colonoscopy.

## 2014-12-22 NOTE — Progress Notes (Signed)
Referring Provider: Sinda Du, MD Primary Care Physician:  Alonza Bogus, MD Primary GI:  Dr. Gala Romney  Chief Complaint  Patient presents with  . Follow-up  . set up TCS    HPI:   64 year old female presents for follow-up on endoscopy and to set up surveillance colonoscopy. At last visit 6 months ago patient was having symptomatic GERD and dysphagia. She subsequently underwent an endoscopy with dilation on 06/11/2014 for recurrent dysphagia, recurrent GERD off PPI, known Schatzki's ring. Findings include noncritical Schatzki's ring, tiny distal esophageal erosion consistent with mild gastritis, no Barrett's esophagus, nodularity or tumor, moderate size hiatal hernia, normal first and second duodenum. Her Schatzki's ring was dilated with a 56 [place] Maloney dilator which did not disrupt the ring and subsequently 4 quadrant bites with biopsy forceps were taken to disrupt which was successful. Recommend resume PPI 40 mg daily and follow-up office visit. The patient of note was sent a letter at this time for colonoscopy. She has a family history of colon cancer in 2 brothers. Last colonoscopy in 2011 which found friable anal canal and hemorrhoids.  Today she states overall she's doing pretty good. Occasional/rare irritable stomach. Dysphagia has resolved. No GERD symptoms since taking PPI. Denies abdominal pain, N/V, hematochezia, melena. Denies unintentional weight loss, unexplained fever and chills. Denies chest pain, dyspnea, dizziness, lightheadedness, syncope, near syncope. Denies any other upper or lower GI symptoms.  Past Medical History  Diagnosis Date  . Hypertension   . Hypothyroidism   . Hemorrhoids   . Diverticulitis 2010  . Schatzki's ring     last dilation 2010  . Hiatal hernia   . GERD (gastroesophageal reflux disease)     errosive esophagitis in 2010    Past Surgical History  Procedure Laterality Date  . Abdominal hysterectomy  1986    w/ unilateral  salpingoopherectomy (can't remember which one)  . Cholecystectomy    . Colonoscopy  11/19/2004    Normal rectum/Normal colon  . Esophagogastroduodenoscopy  08/15/2008    Moderate size hiatal hernia/Noncritical Schatzki's ring with superimposed distal esophageal erosion consistent with erosive reflux esophagitis  . Colonoscopy  12/11/09    Rourk-minimally friable anal canal/hemorrhoids otherwise normal  . Appendectomy    . Esophagogastroduodenoscopy N/A 06/11/2014    TSV:XBLTJQZE'S ring dilated/HH    Current Outpatient Prescriptions  Medication Sig Dispense Refill  . levothyroxine (SYNTHROID, LEVOTHROID) 50 MCG tablet Take 50 mcg by mouth daily.      Marland Kitchen losartan-hydrochlorothiazide (HYZAAR) 100-12.5 MG per tablet Take 1 tablet by mouth daily.      . Multiple Vitamins-Minerals (WOMENS MULTIVITAMIN PLUS) TABS Take 1 tablet by mouth daily.    Marland Kitchen omeprazole (PRILOSEC) 40 MG capsule Take 40 mg by mouth daily.    . Probiotic Product (ALIGN) 4 MG CAPS Take 4 mg by mouth daily.    Marland Kitchen HYDROcodone-homatropine (HYCODAN) 5-1.5 MG/5ML syrup Take 5 mLs by mouth 4 (four) times daily.    Marland Kitchen levofloxacin (LEVAQUIN) 500 MG tablet Take 500 mg by mouth daily.    . methylPREDNISolone (MEDROL DOSEPAK) 4 MG TBPK tablet Take 4-24 mg by mouth as directed.     No current facility-administered medications for this visit.    Allergies as of 12/22/2014 - Review Complete 12/22/2014  Allergen Reaction Noted  . Pantoprazole sodium Anaphylaxis and Hives     Family History  Problem Relation Age of Onset  . Stroke Mother   . Diabetes Mother   . Hypertension Father   . Colon cancer Brother  93  . Colon cancer Brother 85    Social History   Social History  . Marital Status: Married    Spouse Name: N/A  . Number of Children: 0  . Years of Education: N/A   Occupational History  . owns [employer], retired [employer]    Social History Main Topics  . Smoking status: Never Smoker   . Smokeless  tobacco: None  . Alcohol Use: No  . Drug Use: No  . Sexual Activity: Not Asked   Other Topics Concern  . None   Social History Narrative    Review of Systems: General: Negative for anorexia, weight loss, fever, chills, fatigue, weakness. ENT: Negative for hoarseness, difficulty swallowing. CV: Negative for chest pain, angina, palpitations, dyspnea on exertion, peripheral edema.  Respiratory: Negative for dyspnea at rest, dyspnea on exertion, cough, sputum, wheezing.  GI: See history of present illness. Endo: Negative for unusual weight change.  Heme: Negative for bruising or bleeding.   Physical Exam: BP 138/68 mmHg  Pulse 69  Temp(Src) 97.2 F (36.2 C)  Ht 4\' 11"  (1.499 m)  Wt 166 lb 3.2 oz (75.388 kg)  BMI 33.55 kg/m2 General:   Alert and oriented. Pleasant and cooperative. Well-nourished and well-developed.  Head:  Normocephalic and atraumatic. Eyes:  Without icterus, sclera clear and conjunctiva pink.  Ears:  Normal auditory acuity. Cardiovascular:  S1, S2 present without murmurs appreciated. Normal pulses noted. Extremities without clubbing or edema. Respiratory:  Clear to auscultation bilaterally. No wheezes, rales, or rhonchi. No distress.  Gastrointestinal:  +BS, rounded, soft, non-tender and non-distended. No HSM noted. No guarding or rebound. No masses appreciated.  Rectal:  Deferred  Neurologic:  Alert and oriented x4;  grossly normal neurologically. Psych:  Alert and cooperative. Normal mood and affect. Heme/Lymph/Immune: No excessive bruising noted.    12/22/2014 8:54 AM

## 2014-12-22 NOTE — Assessment & Plan Note (Signed)
Patient with a chronic history of dysphagia. Currently her symptoms have resolved with endoscopic evaluation and dilation with a 56 [place] Maloney dilator and ring disruption biopsy forceps. Continue PPI as noted above, follow-up as needed.

## 2014-12-22 NOTE — Patient Instructions (Signed)
1. Continue taking Prilosec 40 mg daily.  2. Continue taking her probiotic. 3. We will schedule your procedure for you. 4. Further recommendations to be based on results of your procedure.

## 2014-12-22 NOTE — Assessment & Plan Note (Signed)
GERD symptoms have resolved on her PPI therapy. Recommend she continue her PPI, Prilosec 40 mg daily, for the long-term given her persistent chronic history of this. Return for follow-up as needed.

## 2014-12-23 NOTE — Progress Notes (Signed)
cc'ed to pcp °

## 2014-12-25 ENCOUNTER — Other Ambulatory Visit: Payer: Self-pay | Admitting: Internal Medicine

## 2015-01-14 ENCOUNTER — Telehealth: Payer: Self-pay | Admitting: Internal Medicine

## 2015-01-14 NOTE — Telephone Encounter (Signed)
Pt is scheduled with RMR on 9/23 and is having house guests over the weekend and would rather reschedule her colonoscopy. Please call her at [phone]

## 2015-01-14 NOTE — Telephone Encounter (Signed)
Noted and pt is aware of 30 day OV policy. Pt was set up for OV to reschedule procedure

## 2015-01-16 ENCOUNTER — Ambulatory Visit: Admit: 2015-01-16 | Payer: Federal, State, Local not specified - PPO | Admitting: Internal Medicine

## 2015-01-16 SURGERY — COLONOSCOPY
Anesthesia: Moderate Sedation

## 2015-02-02 ENCOUNTER — Other Ambulatory Visit: Payer: Self-pay

## 2015-02-02 ENCOUNTER — Encounter: Payer: Self-pay | Admitting: Nurse Practitioner

## 2015-02-02 ENCOUNTER — Ambulatory Visit (INDEPENDENT_AMBULATORY_CARE_PROVIDER_SITE_OTHER): Payer: Federal, State, Local not specified - PPO | Admitting: Nurse Practitioner

## 2015-02-02 VITALS — BP 118/71 | HR 69 | Temp 97.2°F | Ht 59.0 in | Wt 168.2 lb

## 2015-02-02 DIAGNOSIS — Z800 Family history of malignant neoplasm of digestive organs: Secondary | ICD-10-CM

## 2015-02-02 NOTE — Assessment & Plan Note (Signed)
Patient with a family history of colon cancer including 2 brothers, one diagnosed at age 66 and one at age 57. Her last colonoscopy in 2011 showed only friable anal canal and hemorrhoids. Recommend 5 year repeat due to her family history which was previously scheduled but had to be canceled and rescheduled. She remains generally a symptomatically GI standpoint. We will proceed with colonoscopy as previously recommended  Proceed with TCS with Dr. Gala Romney in near future: the risks, benefits, and alternatives have been discussed with the patient in detail. The patient states understanding and desires to proceed.  The patient is not on any anticoagulants, anxiolytics, antidepressants, her chronic pain medications. Her endoscopy was successfully completed with conscious sedation initiated again be adequate for her colonoscopy.

## 2015-02-02 NOTE — Progress Notes (Signed)
Referring Provider: Sinda Du, MD Primary Care Physician:  Alonza Bogus, MD Primary GI:  Dr. Gala Romney  Chief Complaint  Patient presents with  . set up TCS    HPI:   64 year old female presents to reschedule colonoscopy. Her colonoscopy was previously scheduled however she had cancel due to life/family events. Last seen in our office 12/22/2014 for family history of colon cancer in 2 brothers. Last colonoscopy in 2011 which found friable anal canal hemorrhoids otherwise negative study and recommended 5 year repeat. Her last office visit she is also followed up on dysphagia and GERD, symptoms are resolved at that time.  Today she states she's not having any symptoms. Denies abdominal pain, N/V, diarrhea, hematochezia, melena, change in bowel habits, fever, chills, unintentional weight loss. Typically has 2 bowel movements daily, which are consistent with [place] 4. Denies chest pain, dyspnea, dizziness, lightheadedness, syncope, near syncope. Denies any other upper or lower GI symptoms.  Past Medical History  Diagnosis Date  . Hypertension   . Hypothyroidism   . Hemorrhoids   . Diverticulitis 2010  . Schatzki's ring     last dilation 2010  . Hiatal hernia   . GERD (gastroesophageal reflux disease)     errosive esophagitis in 2010    Past Surgical History  Procedure Laterality Date  . Abdominal hysterectomy  1986    w/ unilateral salpingoopherectomy (can't remember which one)  . Cholecystectomy    . Colonoscopy  11/19/2004    Normal rectum/Normal colon  . Esophagogastroduodenoscopy  08/15/2008    Moderate size hiatal hernia/Noncritical Schatzki's ring with superimposed distal esophageal erosion consistent with erosive reflux esophagitis  . Colonoscopy  12/11/09    Rourk-minimally friable anal canal/hemorrhoids otherwise normal  . Appendectomy    . Esophagogastroduodenoscopy N/A 06/11/2014    NOM:VEHMCNOB'S ring dilated/HH    Current Outpatient Prescriptions    Medication Sig Dispense Refill  . Cholecalciferol (VITAMIN D3) 5000 UNITS TABS Take 1 tablet by mouth daily.    Marland Kitchen levothyroxine (SYNTHROID, LEVOTHROID) 50 MCG tablet Take 50 mcg by mouth daily.      Marland Kitchen losartan-hydrochlorothiazide (HYZAAR) 100-12.5 MG per tablet Take 1 tablet by mouth daily.      . Multiple Vitamins-Minerals (WOMENS MULTIVITAMIN PLUS) TABS Take 1 tablet by mouth daily.    Marland Kitchen omeprazole (PRILOSEC) 40 MG capsule TAKE 1 CAPSULE BY MOUTH EVERY DAY 30 capsule 11  . Probiotic Product (ALIGN) 4 MG CAPS Take 4 mg by mouth daily.    . peg 3350 powder (MOVIPREP) 100 G SOLR Take 1 kit (200 g total) by mouth as directed. (Patient not taking: Reported on 02/02/2015) 1 kit 0   No current facility-administered medications for this visit.    Allergies as of 02/02/2015 - Review Complete 02/02/2015  Allergen Reaction Noted  . Pantoprazole sodium Anaphylaxis and Hives     Family History  Problem Relation Age of Onset  . Stroke Mother   . Diabetes Mother   . Hypertension Father   . Colon cancer Brother 30  . Colon cancer Brother 35  . Prostate cancer Brother     Social History   Social History  . Marital Status: Married    Spouse Name: N/A  . Number of Children: 0  . Years of Education: N/A   Occupational History  . owns [employer], retired [employer]    Social History Main Topics  . Smoking status: Never Smoker   . Smokeless tobacco: Never Used  . Alcohol Use: No  .  Drug Use: No  . Sexual Activity: Not Asked   Other Topics Concern  . None   Social History Narrative    Review of Systems: General: Negative for anorexia, weight loss, fever, chills, fatigue, weakness. ENT: Negative for hoarseness, difficulty swallowing. CV: Negative for chest pain, angina, palpitations, dyspnea on exertion, peripheral edema.  Respiratory: Negative for dyspnea at rest, dyspnea on exertion, cough, sputum, wheezing.  GI: See history of present illness. Endo: Negative  for unusual weight change.  Heme: Negative for bruising or bleeding.   Physical Exam: BP 118/71 mmHg  Pulse 69  Temp(Src) 97.2 F (36.2 C)  Ht _0  (1.499 m)  Wt 168 lb 3.2 oz (76.295 kg)  BMI 33.95 kg/m2 General: Alert and oriented. Pleasant and cooperative. Well-nourished and well-developed.  Head: Normocephalic and atraumatic. Eyes: Without icterus, sclera clear and conjunctiva pink.  Ears: Normal auditory acuity. Cardiovascular: S1, S2 present without murmurs appreciated. Normal pulses noted. Extremities without clubbing or edema. Respiratory: Clear to auscultation bilaterally. No wheezes, rales, or rhonchi. No distress.  Gastrointestinal: +BS, rounded, soft, non-tender and non-distended. No HSM noted. No guarding or rebound. No masses appreciated.  Rectal: Deferred  Neurologic: Alert and oriented x4; grossly normal neurologically. Psych: Alert and cooperative. Normal mood and affect. Heme/Lymph/Immune: No excessive bruising noted.    02/02/2015 8:48 AM

## 2015-02-02 NOTE — Patient Instructions (Signed)
1. We will schedule your procedure for you. 2. Further recommendations to be based on results your procedure. 

## 2015-02-02 NOTE — Progress Notes (Signed)
CC'ED TO PCP 

## 2015-02-11 ENCOUNTER — Encounter (HOSPITAL_COMMUNITY): Payer: Self-pay | Admitting: *Deleted

## 2015-02-11 ENCOUNTER — Encounter (HOSPITAL_COMMUNITY)
Admission: RE | Disposition: A | Discharge status: Home / Self Care | Payer: Self-pay | Source: Ambulatory Visit | Attending: Internal Medicine

## 2015-02-11 ENCOUNTER — Ambulatory Visit (HOSPITAL_COMMUNITY)
Admission: RE | Admit: 2015-02-11 | Discharge: 2015-02-11 | Disposition: A | Discharge status: Home / Self Care | Payer: Federal, State, Local not specified - PPO | Source: Ambulatory Visit | Attending: Internal Medicine | Admitting: Internal Medicine

## 2015-02-11 DIAGNOSIS — I10 Essential (primary) hypertension: Secondary | ICD-10-CM | POA: Diagnosis not present

## 2015-02-11 DIAGNOSIS — Z79899 Other long term (current) drug therapy: Secondary | ICD-10-CM | POA: Diagnosis not present

## 2015-02-11 DIAGNOSIS — E039 Hypothyroidism, unspecified: Secondary | ICD-10-CM | POA: Diagnosis not present

## 2015-02-11 DIAGNOSIS — Z1211 Encounter for screening for malignant neoplasm of colon: Secondary | ICD-10-CM | POA: Diagnosis not present

## 2015-02-11 DIAGNOSIS — K219 Gastro-esophageal reflux disease without esophagitis: Secondary | ICD-10-CM | POA: Insufficient documentation

## 2015-02-11 DIAGNOSIS — Z800 Family history of malignant neoplasm of digestive organs: Secondary | ICD-10-CM | POA: Insufficient documentation

## 2015-02-11 HISTORY — PX: COLONOSCOPY: SHX5424

## 2015-02-11 LAB — HM COLONOSCOPY

## 2015-02-11 SURGERY — COLONOSCOPY
Anesthesia: Moderate Sedation

## 2015-02-11 MED ORDER — ONDANSETRON HCL 4 MG/2ML IJ SOLN
INTRAMUSCULAR | Status: DC | PRN
  Administered 2015-02-11: 4 mg via INTRAVENOUS

## 2015-02-11 MED ORDER — STERILE WATER FOR IRRIGATION IR SOLN
Status: DC | PRN
  Administered 2015-02-11: 11:00:00

## 2015-02-11 MED ORDER — MIDAZOLAM HCL 5 MG/5ML IJ SOLN
INTRAMUSCULAR | Status: DC | PRN
  Administered 2015-02-11: 1 mg via INTRAVENOUS
  Administered 2015-02-11: 2 mg via INTRAVENOUS

## 2015-02-11 MED ORDER — MEPERIDINE HCL 100 MG/ML IJ SOLN
INTRAMUSCULAR | Status: AC
  Filled 2015-02-11: qty 2

## 2015-02-11 MED ORDER — MIDAZOLAM HCL 5 MG/5ML IJ SOLN
INTRAMUSCULAR | Status: AC
  Filled 2015-02-11: qty 10

## 2015-02-11 MED ORDER — ONDANSETRON HCL 4 MG/2ML IJ SOLN
INTRAMUSCULAR | Status: AC
  Filled 2015-02-11: qty 2

## 2015-02-11 MED ORDER — SODIUM CHLORIDE 0.9 % IV SOLN
INTRAVENOUS | Status: DC
  Administered 2015-02-11: 1000 mL via INTRAVENOUS

## 2015-02-11 MED ORDER — MEPERIDINE HCL 100 MG/ML IJ SOLN
INTRAMUSCULAR | Status: DC | PRN
  Administered 2015-02-11: 50 mg via INTRAVENOUS
  Administered 2015-02-11: 25 mg via INTRAVENOUS

## 2015-02-11 NOTE — Interval H&P Note (Signed)
History and Physical Interval Note:  02/11/2015 10:16 AM  Mackenzie Mcpherson  has presented today for surgery, with the diagnosis of family history of colon cancer  The various methods of treatment have been discussed with the patient and family. After consideration of risks, benefits and other options for treatment, the patient has consented to  Procedure(s) with comments: COLONOSCOPY (N/A) - 1100 - moved to 10:30 - Candy notified pt as a surgical intervention .  The patient's history has been reviewed, patient examined, no change in status, stable for surgery.  I have reviewed the patient's chart and labs.  Questions were answered to the patient's satisfaction.     Anhthu Perdew  No change.  High-risk screening colonoscopy per plan.  The risks, benefits, limitations, alternatives and imponderables have been reviewed with the patient. Questions have been answered. All parties are agreeable.

## 2015-02-11 NOTE — Interval H&P Note (Signed)
History and Physical Interval Note:  02/11/2015 10:10 AM  Mackenzie Mcpherson  has presented today for surgery, with the diagnosis of family history of colon cancer  The various methods of treatment have been discussed with the patient and family. After consideration of risks, benefits and other options for treatment, the patient has consented to  Procedure(s) with comments: COLONOSCOPY (N/A) - 1100 - moved to 10:30 - Candy notified pt as a surgical intervention .  The patient's history has been reviewed, patient examined, no change in status, stable for surgery.  I have reviewed the patient's chart and labs.  Questions were answered to the patient's satisfaction.     Alexios Keown    No change. High risk rating colonoscopy per plan.The risks, benefits, limitations, alternatives and imponderables have been reviewed with the patient. Questions have been answered. All parties are agreeable.

## 2015-02-11 NOTE — Discharge Instructions (Signed)

## 2015-02-11 NOTE — Op Note (Signed)
[facility] [address] [place], [address]   COLONOSCOPY PROCEDURE REPORT  PATIENT: Mackenzie Mcpherson, Mackenzie Mcpherson  MR#: [id number] BIRTHDATE: 1950/12/28 , 11  yrs. old GENDER: female ENDOSCOPIST: R.  Garfield Cornea, MD FACP [facility] REFERRED WE:[id number] Luan Pulling, M.D. PROCEDURE DATE:  2015/02/21 PROCEDURE:   Colonoscopy, high risk screening INDICATIONS:High-risk screening; positive family history of colon cancer. MEDICATIONS: Versed 3 mg IV and Demerol 75 mg IV in divided doses. Zofran 4 mg IV. ASA CLASS:       Class II  CONSENT: The risks, benefits, alternatives and imponderables including but not limited to bleeding, perforation as well as the possibility of a missed lesion have been reviewed.  The potential for biopsy, lesion removal, etc. have also been discussed. Questions have been answered.  All parties agreeable.  Please see the history and physical in the medical record for more information.  DESCRIPTION OF PROCEDURE:   After the risks benefits and alternatives of the procedure were thoroughly explained, informed consent was obtained.  The digital rectal exam revealed no abnormalities of the rectum.   The EC-[id number] ([id number])  endoscope was introduced through the anus and advanced to the cecum, which was identified by both the appendix and ileocecal valve. No adverse events experienced.   The quality of the prep was adequate  The instrument was then slowly withdrawn as the colon was fully examined. Estimated blood loss is zero unless otherwise noted in this procedure report.      COLON FINDINGS: Normal-appearing rectal mucosa.  Normal-appearing colonic mucosa.  Retroflexion was performed. .  Withdrawal time=8 minutes 0 seconds.  The scope was withdrawn and the procedure completed. COMPLICATIONS: There were no immediate complications.  ENDOSCOPIC IMPRESSION: Normal colonoscopy  RECOMMENDATIONS: Repeat examination in 5 years  eSigned:  R. Garfield Cornea,  MD Rosalita Chess[facility] 2015/02/21 10:43 AM   cc:  CPT CODES: ICD CODES:  The ICD and CPT codes recommended by this software are interpretations from the data that the clinical staff has captured with the software.  The verification of the translation of this report to the ICD and CPT codes and modifiers is the sole responsibility of the health care institution and practicing physician where this report was generated.  [facility]. will not be held responsible for the validity of the ICD and CPT codes included on this report.  AMA assumes no liability for data contained or not contained herein. CPT is a Designer, television/film set of the [employer].

## 2015-02-11 NOTE — H&P (View-Only) (Signed)
Referring Provider: Sinda Du, MD Primary Care Physician:  Alonza Bogus, MD Primary GI:  Dr. Gala Romney  Chief Complaint  Patient presents with  . set up TCS    HPI:   64 year old female presents to reschedule colonoscopy. Her colonoscopy was previously scheduled however she had cancel due to life/family events. Last seen in our office 12/22/2014 for family history of colon cancer in 2 brothers. Last colonoscopy in 2011 which found friable anal canal hemorrhoids otherwise negative study and recommended 5 year repeat. Her last office visit she is also followed up on dysphagia and GERD, symptoms are resolved at that time.  Today she states she's not having any symptoms. Denies abdominal pain, N/V, diarrhea, hematochezia, melena, change in bowel habits, fever, chills, unintentional weight loss. Typically has 2 bowel movements daily, which are consistent with [place] 4. Denies chest pain, dyspnea, dizziness, lightheadedness, syncope, near syncope. Denies any other upper or lower GI symptoms.  Past Medical History  Diagnosis Date  . Hypertension   . Hypothyroidism   . Hemorrhoids   . Diverticulitis 2010  . Schatzki's ring     last dilation 2010  . Hiatal hernia   . GERD (gastroesophageal reflux disease)     errosive esophagitis in 2010    Past Surgical History  Procedure Laterality Date  . Abdominal hysterectomy  1986    w/ unilateral salpingoopherectomy (can't remember which one)  . Cholecystectomy    . Colonoscopy  11/19/2004    Normal rectum/Normal colon  . Esophagogastroduodenoscopy  08/15/2008    Moderate size hiatal hernia/Noncritical Schatzki's ring with superimposed distal esophageal erosion consistent with erosive reflux esophagitis  . Colonoscopy  12/11/09    Rourk-minimally friable anal canal/hemorrhoids otherwise normal  . Appendectomy    . Esophagogastroduodenoscopy N/A 06/11/2014    NOM:VEHMCNOB'S ring dilated/HH    Current Outpatient Prescriptions    Medication Sig Dispense Refill  . Cholecalciferol (VITAMIN D3) 5000 UNITS TABS Take 1 tablet by mouth daily.    Marland Kitchen levothyroxine (SYNTHROID, LEVOTHROID) 50 MCG tablet Take 50 mcg by mouth daily.      Marland Kitchen losartan-hydrochlorothiazide (HYZAAR) 100-12.5 MG per tablet Take 1 tablet by mouth daily.      . Multiple Vitamins-Minerals (WOMENS MULTIVITAMIN PLUS) TABS Take 1 tablet by mouth daily.    Marland Kitchen omeprazole (PRILOSEC) 40 MG capsule TAKE 1 CAPSULE BY MOUTH EVERY DAY 30 capsule 11  . Probiotic Product (ALIGN) 4 MG CAPS Take 4 mg by mouth daily.    . peg 3350 powder (MOVIPREP) 100 G SOLR Take 1 kit (200 g total) by mouth as directed. (Patient not taking: Reported on 02/02/2015) 1 kit 0   No current facility-administered medications for this visit.    Allergies as of 02/02/2015 - Review Complete 02/02/2015  Allergen Reaction Noted  . Pantoprazole sodium Anaphylaxis and Hives     Family History  Problem Relation Age of Onset  . Stroke Mother   . Diabetes Mother   . Hypertension Father   . Colon cancer Brother 30  . Colon cancer Brother 35  . Prostate cancer Brother     Social History   Social History  . Marital Status: Married    Spouse Name: N/A  . Number of Children: 0  . Years of Education: N/A   Occupational History  . owns [employer], retired [employer]    Social History Main Topics  . Smoking status: Never Smoker   . Smokeless tobacco: Never Used  . Alcohol Use: No  .  Drug Use: No  . Sexual Activity: Not Asked   Other Topics Concern  . None   Social History Narrative    Review of Systems: General: Negative for anorexia, weight loss, fever, chills, fatigue, weakness. ENT: Negative for hoarseness, difficulty swallowing. CV: Negative for chest pain, angina, palpitations, dyspnea on exertion, peripheral edema.  Respiratory: Negative for dyspnea at rest, dyspnea on exertion, cough, sputum, wheezing.  GI: See history of present illness. Endo: Negative  for unusual weight change.  Heme: Negative for bruising or bleeding.   Physical Exam: BP 118/71 mmHg  Pulse 69  Temp(Src) 97.2 F (36.2 C)  Ht _0  (1.499 m)  Wt 168 lb 3.2 oz (76.295 kg)  BMI 33.95 kg/m2 General: Alert and oriented. Pleasant and cooperative. Well-nourished and well-developed.  Head: Normocephalic and atraumatic. Eyes: Without icterus, sclera clear and conjunctiva pink.  Ears: Normal auditory acuity. Cardiovascular: S1, S2 present without murmurs appreciated. Normal pulses noted. Extremities without clubbing or edema. Respiratory: Clear to auscultation bilaterally. No wheezes, rales, or rhonchi. No distress.  Gastrointestinal: +BS, rounded, soft, non-tender and non-distended. No HSM noted. No guarding or rebound. No masses appreciated.  Rectal: Deferred  Neurologic: Alert and oriented x4; grossly normal neurologically. Psych: Alert and cooperative. Normal mood and affect. Heme/Lymph/Immune: No excessive bruising noted.    02/02/2015 8:48 AM

## 2015-02-13 ENCOUNTER — Encounter (HOSPITAL_COMMUNITY): Payer: Self-pay | Admitting: Internal Medicine

## 2015-04-01 ENCOUNTER — Other Ambulatory Visit (HOSPITAL_COMMUNITY): Payer: Self-pay | Admitting: Pulmonary Disease

## 2015-04-01 DIAGNOSIS — Z1231 Encounter for screening mammogram for malignant neoplasm of breast: Secondary | ICD-10-CM

## 2015-04-22 ENCOUNTER — Ambulatory Visit (HOSPITAL_COMMUNITY)
Admission: RE | Admit: 2015-04-22 | Discharge: 2015-04-22 | Disposition: A | Discharge status: Home / Self Care | Payer: Federal, State, Local not specified - PPO | Source: Ambulatory Visit | Attending: Pulmonary Disease | Admitting: Pulmonary Disease

## 2015-04-22 DIAGNOSIS — Z1231 Encounter for screening mammogram for malignant neoplasm of breast: Secondary | ICD-10-CM | POA: Insufficient documentation

## 2015-04-28 ENCOUNTER — Other Ambulatory Visit (HOSPITAL_COMMUNITY): Payer: Self-pay | Admitting: Pulmonary Disease

## 2015-04-28 DIAGNOSIS — N959 Unspecified menopausal and perimenopausal disorder: Secondary | ICD-10-CM

## 2015-04-28 DIAGNOSIS — Z79899 Other long term (current) drug therapy: Secondary | ICD-10-CM

## 2015-05-05 ENCOUNTER — Ambulatory Visit (HOSPITAL_COMMUNITY)
Admission: RE | Admit: 2015-05-05 | Discharge: 2015-05-05 | Disposition: A | Discharge status: Home / Self Care | Payer: Federal, State, Local not specified - PPO | Source: Ambulatory Visit | Attending: Pulmonary Disease | Admitting: Pulmonary Disease

## 2015-05-05 DIAGNOSIS — N959 Unspecified menopausal and perimenopausal disorder: Secondary | ICD-10-CM

## 2015-05-05 DIAGNOSIS — Z79899 Other long term (current) drug therapy: Secondary | ICD-10-CM

## 2015-05-05 DIAGNOSIS — E559 Vitamin D deficiency, unspecified: Secondary | ICD-10-CM | POA: Diagnosis not present

## 2015-05-05 DIAGNOSIS — Z780 Asymptomatic menopausal state: Secondary | ICD-10-CM | POA: Insufficient documentation

## 2015-05-05 LAB — HM DEXA SCAN

## 2015-07-03 ENCOUNTER — Other Ambulatory Visit: Payer: Self-pay

## 2015-07-03 ENCOUNTER — Emergency Department (HOSPITAL_COMMUNITY): Payer: Federal, State, Local not specified - PPO

## 2015-07-03 ENCOUNTER — Encounter (HOSPITAL_COMMUNITY): Payer: Self-pay | Admitting: Emergency Medicine

## 2015-07-03 ENCOUNTER — Emergency Department (HOSPITAL_COMMUNITY)
Admission: EM | Admit: 2015-07-03 | Discharge: 2015-07-03 | Disposition: A | Discharge status: Home / Self Care | Payer: Federal, State, Local not specified - PPO | Attending: Emergency Medicine | Admitting: Emergency Medicine

## 2015-07-03 DIAGNOSIS — R072 Precordial pain: Secondary | ICD-10-CM | POA: Insufficient documentation

## 2015-07-03 DIAGNOSIS — E039 Hypothyroidism, unspecified: Secondary | ICD-10-CM | POA: Insufficient documentation

## 2015-07-03 DIAGNOSIS — R079 Chest pain, unspecified: Secondary | ICD-10-CM

## 2015-07-03 DIAGNOSIS — I10 Essential (primary) hypertension: Secondary | ICD-10-CM | POA: Insufficient documentation

## 2015-07-03 LAB — CBC WITH DIFFERENTIAL/PLATELET
BASOS ABS: 0 10*3/uL (ref 0.0–0.1)
BASOS PCT: 0 %
Eosinophils Absolute: 0.1 10*3/uL (ref 0.0–0.7)
Eosinophils Relative: 2 %
HCT: 38.8 % (ref 36.0–46.0)
Hemoglobin: 12.2 g/dL (ref 12.0–15.0)
LYMPHS PCT: 42 %
Lymphs Abs: 2.8 10*3/uL (ref 0.7–4.0)
MCH: 27.3 pg (ref 26.0–34.0)
MCHC: 31.4 g/dL (ref 30.0–36.0)
MCV: 86.8 fL (ref 78.0–100.0)
MONO ABS: 0.5 10*3/uL (ref 0.1–1.0)
MONOS PCT: 7 %
NEUTROS ABS: 3.3 10*3/uL (ref 1.7–7.7)
NEUTROS PCT: 49 %
PLATELETS: 259 10*3/uL (ref 150–400)
RBC: 4.47 MIL/uL (ref 3.87–5.11)
RDW: 15.2 % (ref 11.5–15.5)
WBC: 6.7 10*3/uL (ref 4.0–10.5)

## 2015-07-03 LAB — COMPREHENSIVE METABOLIC PANEL
ALT: 22 U/L (ref 14–54)
AST: 23 U/L (ref 15–41)
Albumin: 3.8 g/dL (ref 3.5–5.0)
Alkaline Phosphatase: 69 U/L (ref 38–126)
Anion gap: 6 (ref 5–15)
BUN: 18 mg/dL (ref 6–20)
CHLORIDE: 104 mmol/L (ref 101–111)
CO2: 29 mmol/L (ref 22–32)
Calcium: 8.7 mg/dL — ABNORMAL LOW (ref 8.9–10.3)
Creatinine, Ser: 0.88 mg/dL (ref 0.44–1.00)
Glucose, Bld: 90 mg/dL (ref 65–99)
POTASSIUM: 3.5 mmol/L (ref 3.5–5.1)
Sodium: 139 mmol/L (ref 135–145)
Total Bilirubin: 0.4 mg/dL (ref 0.3–1.2)
Total Protein: 7.6 g/dL (ref 6.5–8.1)

## 2015-07-03 LAB — TROPONIN I

## 2015-07-03 NOTE — ED Notes (Signed)
PT c/o central chest sharp pains that come and go quickly x1 month with no SOB reported.

## 2015-07-03 NOTE — ED Provider Notes (Signed)
CSN: [id number]     Arrival date & time 07/03/15  1503 History   First MD Initiated Contact with Patient 07/03/15 1913     Chief Complaint  Patient presents with  . Chest Pain    Patient is a 65 y.o. female presenting with chest pain. The history is provided by the patient.  Chest Pain Pain location:  Substernal area Associated symptoms: no abdominal pain, no back pain, no cough, no headache, no nausea, no numbness, no shortness of breath, not vomiting and no weakness    patient presents with chest pain. It comes and goes and is in her mid chest. Lasts about a minute. Not associated with exertion. Sometimes feel short of breath with the episodes. She's had on and off for the last month. May been worse the last few days. No fevers or chills. She will occasionally feel as if she has had some wheezing. She has been doing some traveling. No swelling or legs. No coronary artery disease. Does have a history of esophagitis and GERD. No diaphoresis. She does not smoke.  Past Medical History  Diagnosis Date  . Hypertension   . Hypothyroidism   . Hemorrhoids   . Diverticulitis 2010  . Schatzki's ring     last dilation 2010  . Hiatal hernia   . GERD (gastroesophageal reflux disease)     errosive esophagitis in 2010   Past Surgical History  Procedure Laterality Date  . Abdominal hysterectomy  1986    w/ unilateral salpingoopherectomy (can't remember which one)  . Cholecystectomy    . Colonoscopy  11/19/2004    Normal rectum/Normal colon  . Esophagogastroduodenoscopy  08/15/2008    Moderate size hiatal hernia/Noncritical Schatzki's ring with superimposed distal esophageal erosion consistent with erosive reflux esophagitis  . Colonoscopy  12/11/09    Rourk-minimally friable anal canal/hemorrhoids otherwise normal  . Appendectomy    . Esophagogastroduodenoscopy N/A 06/11/2014    [id number] ring dilated/HH  . Colonoscopy N/A 02/11/2015    Procedure: COLONOSCOPY;  Surgeon: Daneil Dolin,  MD;  Location: [facility];  Service: Endoscopy;  Laterality: N/A;  1100 - moved to 10:30 - Candy notified pt   Family History  Problem Relation Age of Onset  . Stroke Mother   . Diabetes Mother   . Hypertension Father   . Colon cancer Brother 89  . Colon cancer Brother 46  . Prostate cancer Brother    Social History  Substance Use Topics  . Smoking status: Never Smoker   . Smokeless tobacco: Never Used  . Alcohol Use: No   OB History    No data available     Review of Systems  Constitutional: Negative for activity change and appetite change.  Eyes: Negative for pain.  Respiratory: Negative for cough, chest tightness and shortness of breath.   Cardiovascular: Positive for chest pain. Negative for leg swelling.  Gastrointestinal: Negative for nausea, vomiting, abdominal pain and diarrhea.  Genitourinary: Negative for flank pain.  Musculoskeletal: Negative for back pain and neck stiffness.  Skin: Negative for rash.  Neurological: Negative for weakness, numbness and headaches.  Psychiatric/Behavioral: Negative for behavioral problems.      Allergies  Pantoprazole sodium  Home Medications   Prior to Admission medications   Medication Sig Start Date End Date Taking? Authorizing Provider  Cholecalciferol (VITAMIN D3) 5000 UNITS TABS Take 1 tablet by mouth daily.    Historical Provider, MD  levothyroxine (SYNTHROID, LEVOTHROID) 50 MCG tablet Take 50 mcg by mouth daily.  Historical Provider, MD  losartan-hydrochlorothiazide (HYZAAR) 100-12.5 MG per tablet Take 1 tablet by mouth daily.      Historical Provider, MD  Multiple Vitamins-Minerals (WOMENS MULTIVITAMIN PLUS) TABS Take 1 tablet by mouth daily.    Historical Provider, MD  omeprazole (PRILOSEC) 40 MG capsule TAKE 1 CAPSULE BY MOUTH EVERY DAY 12/25/14   Mahala Menghini, PA-C  Probiotic Product (ALIGN) 4 MG CAPS Take 4 mg by mouth daily.    Historical Provider, MD   BP 134/67 mmHg  Pulse 75  Temp(Src) 98.8 F (37.1  C) (Oral)  Resp 18  Ht 5\' 1"  (1.549 m)  Wt 160 lb (72.576 kg)  BMI 30.25 kg/m2  SpO2 99% Physical Exam  Constitutional: She is oriented to person, place, and time. She appears well-developed and well-nourished.  HENT:  Head: Normocephalic and atraumatic.  Neck: Normal range of motion. Neck supple. No JVD present.  Cardiovascular: Normal rate, regular rhythm and normal heart sounds.   No murmur heard. Pulmonary/Chest: Effort normal and breath sounds normal. No respiratory distress. She has no wheezes. She has no rales. She exhibits tenderness.  Tenderness to anterior parasternal area. No rash. No deformity.  Abdominal: Soft. Bowel sounds are normal. There is no tenderness.  Musculoskeletal: Normal range of motion.  Neurological: She is alert and oriented to person, place, and time. No cranial nerve deficit.  Skin: Skin is warm and dry.  Psychiatric: Her speech is normal.  Nursing note and vitals reviewed.   ED Course  Procedures (including critical care time) Labs Review Labs Reviewed  COMPREHENSIVE METABOLIC PANEL - Abnormal; Notable for the following:    Calcium 8.7 (*)    All other components within normal limits  CBC WITH DIFFERENTIAL/PLATELET  TROPONIN I    Imaging Review Dg Chest 2 View  07/03/2015  CLINICAL DATA:  Chest pain EXAM: CHEST  2 VIEW COMPARISON:  09/21/2014 FINDINGS: The heart size and mediastinal contours are within normal limits. Both lungs are clear. The visualized skeletal structures are unremarkable. IMPRESSION: No active cardiopulmonary disease. Electronically Signed   By: Franchot Gallo M.D.   On: 07/03/2015 15:37   I have personally reviewed and evaluated these images and lab results as part of my medical decision-making.   EKG Interpretation   Date/Time:  Friday July 03 2015 15:12:08 EST Ventricular Rate:  68 PR Interval:  172 QRS Duration: 72 QT Interval:  366 QTC Calculation: 389 R Axis:   47 Text Interpretation:  Normal sinus rhythm  with sinus arrhythmia Low  voltage QRS Abnormal ECG Confirmed by Alvino Chapel  MD, Ovid Curd [phone]) on  07/03/2015 7:24:43 PM      MDM   Final diagnoses:  Chest pain, unspecified chest pain type    Patient with chest pain. EKG and lab work reassuring. X-ray no clear cause. Doubt cardiac cause or pulmonary embolism. Will discharge home to follow-up with the primary care doctor.    Davonna Belling, MD 07/03/15 1944

## 2015-07-03 NOTE — Discharge Instructions (Signed)
Nonspecific Chest Pain  °Chest pain can be caused by many different conditions. There is always a chance that your pain could be related to something serious, such as a heart attack or a blood clot in your lungs. Chest pain can also be caused by conditions that are not life-threatening. If you have chest pain, it is very important to follow up with your health care provider. °CAUSES  °Chest pain can be caused by: °· Heartburn. °· Pneumonia or bronchitis. °· Anxiety or stress. °· Inflammation around your heart (pericarditis) or lung (pleuritis or pleurisy). °· A blood clot in your lung. °· A collapsed lung (pneumothorax). It can develop suddenly on its own (spontaneous pneumothorax) or from trauma to the chest. °· Shingles infection (varicella-zoster virus). °· Heart attack. °· Damage to the bones, muscles, and cartilage that make up your chest wall. This can include: °¨ Bruised bones due to injury. °¨ Strained muscles or cartilage due to frequent or repeated coughing or overwork. °¨ Fracture to one or more ribs. °¨ Sore cartilage due to inflammation (costochondritis). °RISK FACTORS  °Risk factors for chest pain may include: °· Activities that increase your risk for trauma or injury to your chest. °· Respiratory infections or conditions that cause frequent coughing. °· Medical conditions or overeating that can cause heartburn. °· Heart disease or family history of heart disease. °· Conditions or health behaviors that increase your risk of developing a blood clot. °· Having had chicken pox (varicella zoster). °SIGNS AND SYMPTOMS °Chest pain can feel like: °· Burning or tingling on the surface of your chest or deep in your chest. °· Crushing, pressure, aching, or squeezing pain. °· Dull or sharp pain that is worse when you move, cough, or take a deep breath. °· Pain that is also felt in your back, neck, shoulder, or arm, or pain that spreads to any of these areas. °Your chest pain may come and go, or it may stay  constant. °DIAGNOSIS °Lab tests or other studies may be needed to find the cause of your pain. Your health care provider may have you take a test called an ambulatory ECG (electrocardiogram). An ECG records your heartbeat patterns at the time the test is performed. You may also have other tests, such as: °· Transthoracic echocardiogram (TTE). During echocardiography, sound waves are used to create a picture of all of the heart structures and to look at how blood flows through your heart. °· Transesophageal echocardiogram (TEE). This is a more advanced imaging test that obtains images from inside your body. It allows your health care provider to see your heart in finer detail. °· Cardiac monitoring. This allows your health care provider to monitor your heart rate and rhythm in real time. °· Holter monitor. This is a portable device that records your heartbeat and can help to diagnose abnormal heartbeats. It allows your health care provider to track your heart activity for several days, if needed. °· Stress tests. These can be done through exercise or by taking medicine that makes your heart beat more quickly. °· Blood tests. °· Imaging tests. °TREATMENT  °Your treatment depends on what is causing your chest pain. Treatment may include: °· Medicines. These may include: °¨ Acid blockers for heartburn. °¨ Anti-inflammatory medicine. °¨ Pain medicine for inflammatory conditions. °¨ Antibiotic medicine, if an infection is present. °¨ Medicines to dissolve blood clots. °¨ Medicines to treat coronary artery disease. °· Supportive care for conditions that do not require medicines. This may include: °¨ Resting. °¨ Applying heat   or cold packs to injured areas. °¨ Limiting activities until pain decreases. °HOME CARE INSTRUCTIONS °· If you were prescribed an antibiotic medicine, finish it all even if you start to feel better. °· Avoid any activities that bring on chest pain. °· Do not use any tobacco products, including  cigarettes, chewing tobacco, or electronic cigarettes. If you need help quitting, ask your health care provider. °· Do not drink alcohol. °· Take medicines only as directed by your health care provider. °· Keep all follow-up visits as directed by your health care provider. This is important. This includes any further testing if your chest pain does not go away. °· If heartburn is the cause for your chest pain, you may be told to keep your head raised (elevated) while sleeping. This reduces the chance that acid will go from your stomach into your esophagus. °· Make lifestyle changes as directed by your health care provider. These may include: °¨ Getting regular exercise. Ask your health care provider to suggest some activities that are safe for you. °¨ Eating a heart-healthy diet. A registered dietitian can help you to learn healthy eating options. °¨ Maintaining a healthy weight. °¨ Managing diabetes, if necessary. °¨ Reducing stress. °SEEK MEDICAL CARE IF: °· Your chest pain does not go away after treatment. °· You have a rash with blisters on your chest. °· You have a fever. °SEEK IMMEDIATE MEDICAL CARE IF:  °· Your chest pain is worse. °· You have an increasing cough, or you cough up blood. °· You have severe abdominal pain. °· You have severe weakness. °· You faint. °· You have chills. °· You have sudden, unexplained chest discomfort. °· You have sudden, unexplained discomfort in your arms, back, neck, or jaw. °· You have shortness of breath at any time. °· You suddenly start to sweat, or your skin gets clammy. °· You feel nauseous or you vomit. °· You suddenly feel light-headed or dizzy. °· Your heart begins to beat quickly, or it feels like it is skipping beats. °These symptoms may represent a serious problem that is an emergency. Do not wait to see if the symptoms will go away. Get medical help right away. Call your local emergency services (911 in the [place] Do not drive yourself to the hospital. °  °This  information is not intended to replace advice given to you by your health care provider. Make sure you discuss any questions you have with your health care provider. °  °Document Released: 01/19/2005 Document Revised: 05/02/2014 Document Reviewed: 11/15/2013 °Elsevier Interactive Patient Education ©2016 [employer] ° °

## 2015-07-03 NOTE — ED Notes (Signed)
Patient signed electronic discharge, pad not working.

## 2015-09-30 ENCOUNTER — Encounter: Payer: Self-pay | Admitting: Orthopaedic Surgery

## 2015-09-30 ENCOUNTER — Ambulatory Visit (INDEPENDENT_AMBULATORY_CARE_PROVIDER_SITE_OTHER): Payer: Federal, State, Local not specified - PPO

## 2015-09-30 ENCOUNTER — Ambulatory Visit (INDEPENDENT_AMBULATORY_CARE_PROVIDER_SITE_OTHER): Payer: Federal, State, Local not specified - PPO | Admitting: Orthopaedic Surgery

## 2015-09-30 VITALS — BP 117/63 | HR 80 | Temp 97.7°F | Ht <= 58 in | Wt 161.4 lb

## 2015-09-30 DIAGNOSIS — M25531 Pain in right wrist: Secondary | ICD-10-CM | POA: Diagnosis not present

## 2015-09-30 DIAGNOSIS — M654 Radial styloid tenosynovitis [de Quervain]: Secondary | ICD-10-CM

## 2015-09-30 NOTE — Progress Notes (Signed)
Subjective:  My right wrist has been hurting me.    Patient ID: Mackenzie Mcpherson, female    DOB: 1950-09-23, 65 y.o.   MRN: [id number]  Wrist Injury  The incident occurred more than 1 week ago. The incident occurred at home. The injury mechanism was repetitive motion. The pain is present in the right wrist. The quality of the pain is described as aching. The pain radiates to the right hand. The pain is at a severity of 4/10. The pain is moderate. The pain has been worsening since the incident. Pertinent negatives include no chest pain. The symptoms are aggravated by lifting. She has tried ice, immobilization, rest and acetaminophen for the symptoms. The treatment provided moderate relief.   She was moving furniture at her home by herself several weeks ago.  She had to move a large lamp and a sofa among other items.  The lamp was very heavy.  She had to grasp it very tightly and lift it and move it to the other side of the room.  It was heavier than she expected.  After getting the room like she wanted, she noticed her right hand and wrist were tender.  She later went to bed.  The next day she began having pain in the right thumb, the right radial wrist and the dorsum of the wrist with a little swelling  She had no numbness, no redness.  The pain continued and she saw Dr. Luan Pulling.  He gave her a shot in the hip.  He gave her a wrist splint. Her pain got better slightly but continued.  She saw Dr. Luan Pulling last week and he gave her a shot in the dorsum of the wrist but not in the thumb area.  She said that did not help.  She continued her wrist splint, ice, elevation.  She has no direct trauma, no redness.  The swelling has gone done.    It hurts to move her thumb at times and really hurts if she grabs something heavy and tries to lift it.  It hurts over the thumb and the lateral right wrist area.  She is tired of hurting.   Review of Systems  HENT: Negative for congestion.   Respiratory:  Negative for cough and shortness of breath.   Cardiovascular: Negative for chest pain and leg swelling.  Endocrine: Negative for cold intolerance.  Musculoskeletal: Positive for arthralgias.  Allergic/Immunologic: Positive for environmental allergies.   Past Medical History  Diagnosis Date  . Hypertension   . Hypothyroidism   . Hemorrhoids   . Diverticulitis 2010  . Schatzki's ring     last dilation 2010  . Hiatal hernia   . GERD (gastroesophageal reflux disease)     errosive esophagitis in 2010    Past Surgical History  Procedure Laterality Date  . Abdominal hysterectomy  1986    w/ unilateral salpingoopherectomy (can't remember which one)  . Cholecystectomy    . Colonoscopy  11/19/2004    Normal rectum/Normal colon  . Esophagogastroduodenoscopy  08/15/2008    Moderate size hiatal hernia/Noncritical Schatzki's ring with superimposed distal esophageal erosion consistent with erosive reflux esophagitis  . Colonoscopy  12/11/09    Rourk-minimally friable anal canal/hemorrhoids otherwise normal  . Appendectomy    . Esophagogastroduodenoscopy N/A 06/11/2014    [id number] ring dilated/HH  . Colonoscopy N/A 02/11/2015    Procedure: COLONOSCOPY;  Surgeon: Daneil Dolin, MD;  Location: [facility];  Service: Endoscopy;  Laterality: N/A;  1100 - moved  to 10:30 - Candy notified pt    Current Outpatient Prescriptions on File Prior to Visit  Medication Sig Dispense Refill  . Cholecalciferol (VITAMIN D3) 5000 UNITS TABS Take 1 tablet by mouth daily.    Marland Kitchen levothyroxine (SYNTHROID, LEVOTHROID) 50 MCG tablet Take 50 mcg by mouth daily.      Marland Kitchen losartan-hydrochlorothiazide (HYZAAR) 100-12.5 MG per tablet Take 1 tablet by mouth daily.      . Multiple Vitamins-Minerals (WOMENS MULTIVITAMIN PLUS) TABS Take 1 tablet by mouth daily.    Marland Kitchen omeprazole (PRILOSEC) 40 MG capsule TAKE 1 CAPSULE BY MOUTH EVERY DAY 30 capsule 11  . Probiotic Product (ALIGN) 4 MG CAPS Take 4 mg by mouth daily.      No current facility-administered medications on file prior to visit.    Social History   Social History  . Marital Status: Married    Spouse Name: N/A  . Number of Children: 0  . Years of Education: N/A   Occupational History  . owns [employer], retired [employer]    Social History Main Topics  . Smoking status: Never Smoker   . Smokeless tobacco: Never Used  . Alcohol Use: No  . Drug Use: No  . Sexual Activity: Not on file   Other Topics Concern  . Not on file   Social History Narrative    BP 117/63 mmHg  Pulse 80  Temp(Src) 97.7 F (36.5 C)  Ht 4\' 10"  (1.473 m)  Wt 161 lb 6.4 oz (73.211 kg)  BMI 33.74 kg/m2     Objective:   Physical Exam  Constitutional: She is oriented to person, place, and time. She appears well-developed and well-nourished.  HENT:  Head: Normocephalic and atraumatic.  Eyes: Conjunctivae and EOM are normal. Pupils are equal, round, and reactive to light.  Neck: Normal range of motion. Neck supple.  Cardiovascular: Normal rate, regular rhythm and intact distal pulses.   Pulmonary/Chest: Effort normal.  Abdominal: Soft.  Musculoskeletal: She exhibits tenderness (She is very tender over the first extensor compartment of the right thumb with positive Finklestein sign.  She has no swelling or redness.  NV intacct.  Other hand normal.  ROM of the right wirst OK.).  Neurological: She is alert and oriented to person, place, and time. She displays normal reflexes. No cranial nerve deficit. She exhibits normal muscle tone. Coordination normal.  Skin: Skin is warm and dry.  Psychiatric: She has a normal mood and affect. Her behavior is normal. Judgment and thought content normal.    X-rays were taken of the right wrist and reported separately.  PROCEDURE NOTE  After Permission was given by the patient, I proceeded.  The first extensor compartment was prepped.  1 % Xylocaine and 1 cc of DepoMedrol 40 was instilled by sterile  technique into the first extensor compartment area on the right tolerated well.  She had immediate relief of pain with motion of the thumb on the right.      Assessment & Plan:   Encounter Diagnoses  Name Primary?  . Right wrist pain Yes  . De Quervain's tenosynovitis    I have explained what the tendinitis is and it is probably related to the handling of the lamp.  The injection should help.  I have told her to get some Aspercreme with Lidocaine to rub on the area as well.  I will see her in two weeks.  Call if any problem.  Precautions discussed.  Electronically Signed Sanjuana Kava, MD 6/7/20177:33  PM

## 2015-10-14 ENCOUNTER — Ambulatory Visit (INDEPENDENT_AMBULATORY_CARE_PROVIDER_SITE_OTHER): Payer: Federal, State, Local not specified - PPO | Admitting: Orthopaedic Surgery

## 2015-10-14 ENCOUNTER — Ambulatory Visit: Payer: Federal, State, Local not specified - PPO | Admitting: Orthopaedic Surgery

## 2015-10-14 ENCOUNTER — Encounter: Payer: Self-pay | Admitting: Orthopaedic Surgery

## 2015-10-14 VITALS — BP 117/68 | HR 94 | Temp 96.8°F | Ht 58.5 in | Wt 157.0 lb

## 2015-10-14 DIAGNOSIS — M654 Radial styloid tenosynovitis [de Quervain]: Secondary | ICD-10-CM

## 2015-10-14 NOTE — Progress Notes (Signed)
Patient Mackenzie Mcpherson, female DOB:01-31-1951, 65 y.o. [id number]  Chief Complaint  Patient presents with  . Follow-up    Right wrist    HPI  Mackenzie Mcpherson is a 66 y.o. female who had pain in the first extensor compartment of the right dominant wrist area.  She is significantly improved with no pain now and full motion. After I gave the injection last time she had pain at night, but has had no pain since then. She is very pleased.  HPI  Body mass index is 32.25 kg/(m^2).  ROS  Review of Systems  HENT: Negative for congestion.   Respiratory: Negative for cough and shortness of breath.   Cardiovascular: Negative for chest pain and leg swelling.  Endocrine: Negative for cold intolerance.  Musculoskeletal: Positive for arthralgias.  Allergic/Immunologic: Positive for environmental allergies.    Past Medical History  Diagnosis Date  . Hypertension   . Hypothyroidism   . Hemorrhoids   . Diverticulitis 2010  . Schatzki's ring     last dilation 2010  . Hiatal hernia   . GERD (gastroesophageal reflux disease)     errosive esophagitis in 2010    Past Surgical History  Procedure Laterality Date  . Abdominal hysterectomy  1986    w/ unilateral salpingoopherectomy (can't remember which one)  . Cholecystectomy    . Colonoscopy  11/19/2004    Normal rectum/Normal colon  . Esophagogastroduodenoscopy  08/15/2008    Moderate size hiatal hernia/Noncritical Schatzki's ring with superimposed distal esophageal erosion consistent with erosive reflux esophagitis  . Colonoscopy  12/11/09    Rourk-minimally friable anal canal/hemorrhoids otherwise normal  . Appendectomy    . Esophagogastroduodenoscopy N/A 06/11/2014    [id number] ring dilated/HH  . Colonoscopy N/A 02/11/2015    Procedure: COLONOSCOPY;  Surgeon: Daneil Dolin, MD;  Location: [facility];  Service: Endoscopy;  Laterality: N/A;  1100 - moved to 10:30 - Candy notified pt    Family History  Problem  Relation Age of Onset  . Stroke Mother   . Diabetes Mother   . Hypertension Father   . Colon cancer Brother 13  . Colon cancer Brother 37  . Prostate cancer Brother     Social History Social History  Substance Use Topics  . Smoking status: Never Smoker   . Smokeless tobacco: Never Used  . Alcohol Use: No    Allergies  Allergen Reactions  . Pantoprazole Sodium Anaphylaxis and Hives    Current Outpatient Prescriptions  Medication Sig Dispense Refill  . Cholecalciferol (VITAMIN D3) 5000 UNITS TABS Take 1 tablet by mouth daily.    Marland Kitchen levothyroxine (SYNTHROID, LEVOTHROID) 50 MCG tablet Take 50 mcg by mouth daily.      Marland Kitchen losartan-hydrochlorothiazide (HYZAAR) 100-12.5 MG per tablet Take 1 tablet by mouth daily.      . Multiple Vitamins-Minerals (WOMENS MULTIVITAMIN PLUS) TABS Take 1 tablet by mouth daily.    Marland Kitchen omeprazole (PRILOSEC) 40 MG capsule TAKE 1 CAPSULE BY MOUTH EVERY DAY 30 capsule 11  . Probiotic Product (ALIGN) 4 MG CAPS Take 4 mg by mouth daily.     No current facility-administered medications for this visit.     Physical Exam  Blood pressure 117/68, pulse 94, temperature 96.8 F (36 C), height 4' 10.5" (1.486 m), weight 157 lb (71.215 kg).  Constitutional: overall normal hygiene, normal nutrition, well developed, normal grooming, normal body habitus. Assistive device:none  Musculoskeletal: gait and station Limp none, muscle tone and strength are normal, no tremors or atrophy  is present.  .  Neurological: coordination overall normal.  Deep tendon reflex/nerve stretch intact.  Sensation normal.  Cranial nerves II-XII intact.   Skin:   normal overall no scars, lesions, ulcers or rashes. No psoriasis.  Psychiatric: Alert and oriented x 3.  Recent memory intact, remote memory unclear.  Normal mood and affect. Well groomed.  Good eye contact.  Cardiovascular: overall no swelling, no varicosities, no edema bilaterally, normal temperatures of the legs and arms, no  clubbing, cyanosis and good capillary refill.  Lymphatic: palpation is normal.  She has full ROM with no pain of the right hand, right thumb and wrist.  Grip is normal. NV is normal.  Left hand normal as well.  The patient has been educated about the nature of the problem(s) and counseled on treatment options.  The patient appeared to understand what I have discussed and is in agreement with it.  Encounter Diagnosis  Name Primary?  Tennis Must Quervain's tenosynovitis Yes    PLAN Call if any problems.  Precautions discussed.  Continue current medications.   Return to clinic PRN  Electronically Signed Sanjuana Kava, MD 6/21/20173:13 PM

## 2015-10-15 ENCOUNTER — Ambulatory Visit: Payer: Federal, State, Local not specified - PPO | Admitting: Orthopaedic Surgery

## 2015-11-27 ENCOUNTER — Other Ambulatory Visit (HOSPITAL_COMMUNITY): Payer: Self-pay | Admitting: Pulmonary Disease

## 2015-11-27 DIAGNOSIS — G459 Transient cerebral ischemic attack, unspecified: Secondary | ICD-10-CM

## 2015-12-02 ENCOUNTER — Ambulatory Visit (HOSPITAL_COMMUNITY)
Admission: RE | Admit: 2015-12-02 | Discharge: 2015-12-02 | Disposition: A | Discharge status: Home / Self Care | Payer: Federal, State, Local not specified - PPO | Source: Ambulatory Visit | Attending: Pulmonary Disease | Admitting: Pulmonary Disease

## 2015-12-02 DIAGNOSIS — G459 Transient cerebral ischemic attack, unspecified: Secondary | ICD-10-CM | POA: Diagnosis not present

## 2015-12-02 DIAGNOSIS — I119 Hypertensive heart disease without heart failure: Secondary | ICD-10-CM | POA: Diagnosis not present

## 2015-12-02 DIAGNOSIS — I358 Other nonrheumatic aortic valve disorders: Secondary | ICD-10-CM | POA: Diagnosis not present

## 2015-12-02 DIAGNOSIS — I059 Rheumatic mitral valve disease, unspecified: Secondary | ICD-10-CM | POA: Insufficient documentation

## 2015-12-02 LAB — ECHOCARDIOGRAM COMPLETE
CHL CUP DOP CALC LVOT VTI: 27.3 cm
CHL CUP MV DEC (S): 197
CHL CUP STROKE VOLUME: 24 mL
E decel time: 197 msec
EERAT: 9.16
FS: 38 % (ref 28–44)
IVS/LV PW RATIO, ED: 0.92
LA ID, A-P, ES: 32 mm
LA diam index: 1.83 cm/m2
LA vol A4C: 37.9 ml
LAVOL: 33.7 mL
LAVOLIN: 19.3 mL/m2
LEFT ATRIUM END SYS DIAM: 32 mm
LV E/e' medial: 9.16
LV SIMPSON'S DISK: 61
LV dias vol index: 22 mL/m2
LV dias vol: 39 mL — AB (ref 46–106)
LV e' LATERAL: 9.68 cm/s
LV sys vol: 15 mL (ref 14–42)
LVEEAVG: 9.16
LVOT area: 2.01 cm2
LVOT diameter: 16 mm
LVOT peak grad rest: 5 mmHg
LVOTPV: 111 cm/s
LVOTSV: 55 mL
LVSYSVOLIN: 9 mL/m2
Lateral S' vel: 11 cm/s
MV pk A vel: 76.3 m/s
MV pk E vel: 88.7 m/s
MVPG: 3 mmHg
PW: 10 mm — AB (ref 0.6–1.1)
RV TAPSE: 22.4 mm
TDI e' lateral: 9.68
TDI e' medial: 7.29

## 2015-12-02 NOTE — Progress Notes (Signed)
*  PRELIMINARY RESULTS* Echocardiogram 2D Echocardiogram has been performed.  Mackenzie Mcpherson 12/02/2015, 9:15 AM

## 2016-01-02 ENCOUNTER — Other Ambulatory Visit: Payer: Self-pay | Admitting: Gastroenterology

## 2016-03-15 ENCOUNTER — Other Ambulatory Visit (HOSPITAL_COMMUNITY): Payer: Self-pay | Admitting: Pulmonary Disease

## 2016-03-15 DIAGNOSIS — Z1231 Encounter for screening mammogram for malignant neoplasm of breast: Secondary | ICD-10-CM

## 2016-04-28 ENCOUNTER — Ambulatory Visit (HOSPITAL_COMMUNITY)
Admission: RE | Admit: 2016-04-28 | Discharge: 2016-04-28 | Disposition: A | Discharge status: Home / Self Care | Payer: Federal, State, Local not specified - PPO | Source: Ambulatory Visit | Attending: Pulmonary Disease | Admitting: Pulmonary Disease

## 2016-04-28 DIAGNOSIS — Z1231 Encounter for screening mammogram for malignant neoplasm of breast: Secondary | ICD-10-CM | POA: Insufficient documentation

## 2016-07-18 ENCOUNTER — Other Ambulatory Visit: Payer: Self-pay | Admitting: Gastroenterology

## 2016-08-30 ENCOUNTER — Other Ambulatory Visit (HOSPITAL_COMMUNITY): Payer: Self-pay | Admitting: Pulmonary Disease

## 2016-08-30 ENCOUNTER — Ambulatory Visit (HOSPITAL_COMMUNITY)
Admission: RE | Admit: 2016-08-30 | Discharge: 2016-08-30 | Disposition: A | Discharge status: Home / Self Care | Payer: Federal, State, Local not specified - PPO | Source: Ambulatory Visit | Attending: Pulmonary Disease | Admitting: Pulmonary Disease

## 2016-08-30 DIAGNOSIS — S8265XA Nondisplaced fracture of lateral malleolus of left fibula, initial encounter for closed fracture: Secondary | ICD-10-CM | POA: Diagnosis not present

## 2016-08-30 DIAGNOSIS — W19XXXA Unspecified fall, initial encounter: Secondary | ICD-10-CM | POA: Diagnosis not present

## 2016-08-30 DIAGNOSIS — M25572 Pain in left ankle and joints of left foot: Secondary | ICD-10-CM

## 2016-09-05 ENCOUNTER — Ambulatory Visit (INDEPENDENT_AMBULATORY_CARE_PROVIDER_SITE_OTHER): Payer: Federal, State, Local not specified - PPO | Admitting: Orthopedic Surgery

## 2016-09-05 ENCOUNTER — Encounter: Payer: Self-pay | Admitting: Orthopedic Surgery

## 2016-09-05 VITALS — BP 134/71 | HR 71 | Ht 60.0 in | Wt 158.0 lb

## 2016-09-05 DIAGNOSIS — S86012A Strain of left Achilles tendon, initial encounter: Secondary | ICD-10-CM

## 2016-09-05 NOTE — Patient Instructions (Signed)
Wear a Cam Walker when walking can remove for bathing and sleeping

## 2016-09-05 NOTE — Progress Notes (Signed)
  NEW PATIENT OFFICE VISIT    Chief Complaint  Patient presents with  . Ankle Pain    Left ankle pain, referred by Dr. 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 She actually was sent to the hospital for x-rays and the x-ray showed a lateral malleolus fracture.  She initially had pain in this area but localized to the back of her Achilles. She appears to have a chronic nodule at the Achilles insertion but no paresis complaints of pain there.  No complaints of posterior pain stiffness weightbearing pain      Review of Systems  Constitutional: Negative for chills and fever.  Respiratory: Negative for shortness of breath.   Cardiovascular: Negative for chest pain.    Past Medical History:  Diagnosis Date  . Diverticulitis 2010  . GERD (gastroesophageal reflux disease)    errosive esophagitis in 2010  . Hemorrhoids   . Hiatal hernia   . Hypertension   . Hypothyroidism   . Schatzki's ring    last dilation 2010    Past Surgical History:  Procedure Laterality Date  . ABDOMINAL HYSTERECTOMY  1986   w/ unilateral salpingoopherectomy (can't remember which one)  . APPENDECTOMY    . CHOLECYSTECTOMY    . COLONOSCOPY  11/19/2004   Normal rectum/Normal colon  . COLONOSCOPY  12/11/09   Rourk-minimally friable anal canal/hemorrhoids otherwise normal  . COLONOSCOPY N/A 02/11/2015   Procedure: COLONOSCOPY;  Surgeon: Daneil Dolin, MD;  [facility];  Service: Endoscopy;  Laterality: N/A;  1100 - moved to 10:30 - Candy notified pt  . ESOPHAGOGASTRODUODENOSCOPY  08/15/2008   Moderate size hiatal hernia/Noncritical Schatzki's ring with superimposed distal esophageal erosion consistent with erosive reflux esophagitis  . ESOPHAGOGASTRODUODENOSCOPY N/A 06/11/2014   [id number] ring dilated/HH    Family History  Problem  Relation Age of Onset  . Stroke Mother   . Diabetes Mother   . Hypertension Father   . Colon cancer Brother 59  . Colon cancer Brother 34  . Prostate cancer Brother    Social History  Substance Use Topics  . Smoking status: Never Smoker  . Smokeless tobacco: Never Used  . Alcohol use No    There were no vitals taken for this visit.  Physical Exam  Constitutional: She is oriented to person, place, and time. She appears well-developed and well-nourished.  Neurological: She is alert and oriented to person, place, and time.  Psychiatric: She has a normal mood and affect.  Vitals reviewed.   Ortho Exam She is ambulatory but she is limping with the avoidance gait. She has tenderness and nodularity in the Achilles without defect. She has normal passive range of motion but painful dorsiflexion ankle is stable she has no tenderness over the fibula her motor exam is normal she's neurovascular intact her skin is normal  On the right side we also see nodularity of the Achilles No orders of the defined types were placed in this encounter.   Encounter Diagnosis  Name Primary?  . Strain of left Achilles tendon, initial encounter Yes     PLAN:   Cam Walker ambulate weightbearing as tolerated 6 weeks, 6 weeks follow-up

## 2016-09-22 ENCOUNTER — Telehealth: Payer: Self-pay | Admitting: Orthopedic Surgery

## 2016-09-22 NOTE — Telephone Encounter (Signed)
Patient called asking if its normal for her ankle to hurt every now and then since she is wear the cam walker.  Please advise

## 2016-09-22 NOTE — Telephone Encounter (Signed)
SHE HAD A PREVIOUS FRACTURE SO YES

## 2016-10-17 ENCOUNTER — Ambulatory Visit (INDEPENDENT_AMBULATORY_CARE_PROVIDER_SITE_OTHER): Payer: Federal, State, Local not specified - PPO | Admitting: Orthopedic Surgery

## 2016-10-17 ENCOUNTER — Encounter: Payer: Self-pay | Admitting: Orthopedic Surgery

## 2016-10-17 DIAGNOSIS — S86012D Strain of left Achilles tendon, subsequent encounter: Secondary | ICD-10-CM | POA: Diagnosis not present

## 2016-10-17 NOTE — Progress Notes (Signed)
Patient ID: Mackenzie Mcpherson, female   DOB: Apr 04, 1951, 66 y.o.   MRN: [id number]  Chief Complaint  Patient presents with  . 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 She actually was sent to the hospital for x-rays and the x-ray showed a lateral malleolus fracture.   She initially had pain in this area but localized to the back of her Achilles. She appears to have a chronic nodule at the Achilles insertion but no paresis complaints of pain there.   No complaints of posterior pain stiffness weightbearing pain  We treated her with a Cam Walker ambulate weightbearing as tolerated 6 weeks, 6 weeks follow-up  She was progressing fairly well and then strained the Achilles again. So over back to square one    Review of Systems  Constitutional: Negative for chills and fever.  Respiratory: Negative for shortness of breath.   Cardiovascular: Negative for chest pain.  Unchanged from 09/05/2016    There were no vitals taken for this visit. Gen. appearance is normal grooming and hygiene normal Orientation to person place and time normal Mood normal   Ortho Exam Left Ankle Exam  Swelling: Mild.  Tenderness  The patient is experiencing tenderness in the Tenderness and swelling with home and Achilles tendon thickening.  Range of Motion  Dorsiflexion:   20 Plantar flexion: 40 Inversion:         Eversion:          Muscle Strength  Dorsiflexion:      5/5 Plantar flexion:      5/5 Anterior tibial:        Posterior tibial:      Gastrosoleus:       Peroneal muscle:  Tests  Anterior drawer: Negative.    A/P  Medical decision-making  Encounter Diagnosis  Name Primary?  . Strain of left Achilles tendon, subsequent encounter Yes   At this point I would physical therapy and continue the Cam Walker use ice  and ibuprofen as needed  Limit walking to 14 hours per day in the boot  Follow-up in 4 weeks    Arther Abbott, MD 10/17/2016 8:24 AM

## 2016-10-27 ENCOUNTER — Ambulatory Visit (HOSPITAL_COMMUNITY): Payer: Federal, State, Local not specified - PPO | Attending: Orthopedic Surgery | Admitting: Physical Therapy

## 2016-10-27 DIAGNOSIS — M25672 Stiffness of left ankle, not elsewhere classified: Secondary | ICD-10-CM | POA: Diagnosis present

## 2016-10-27 DIAGNOSIS — M25572 Pain in left ankle and joints of left foot: Secondary | ICD-10-CM | POA: Insufficient documentation

## 2016-10-27 DIAGNOSIS — R262 Difficulty in walking, not elsewhere classified: Secondary | ICD-10-CM | POA: Insufficient documentation

## 2016-10-27 NOTE — Therapy (Signed)
[facility] [place], [name], [address] Phone: [phone]   Fax:  [phone]  Physical Therapy Evaluation  Patient Details  Name: Mackenzie Mcpherson MRN: [id number] Date of Birth: [birth year] Referring Provider: Arther Abbott   Encounter Date: 10/27/2016      PT End of Session - 10/27/16 1131    Visit Number 1   Number of Visits 8   Date for PT Re-Evaluation 11/26/16   Authorization Type BCBS   Authorization - Visit Number 1   Authorization - Number of Visits 8   PT Start Time 0945   PT Stop Time 1030   PT Time Calculation (min) 45 min   Activity Tolerance Patient tolerated treatment well   Behavior During Therapy [facility] for tasks assessed/performed      Past Medical History:  Diagnosis Date  . Diverticulitis 2010  . GERD (gastroesophageal reflux disease)    errosive esophagitis in 2010  . Hemorrhoids   . Hiatal hernia   . Hypertension   . Hypothyroidism   . Schatzki's ring    last dilation 2010    Past Surgical History:  Procedure Laterality Date  . ABDOMINAL HYSTERECTOMY  1986   w/ unilateral salpingoopherectomy (can't remember which one)  . APPENDECTOMY    . CHOLECYSTECTOMY    . COLONOSCOPY  11/19/2004   Normal rectum/Normal colon  . COLONOSCOPY  12/11/09   Rourk-minimally friable anal canal/hemorrhoids otherwise normal  . COLONOSCOPY N/A 02/11/2015   Procedure: COLONOSCOPY;  Surgeon: Daneil Dolin, MD;  [facility];  Service: Endoscopy;  Laterality: N/A;  1100 - moved to 10:30 - Candy notified pt  . ESOPHAGOGASTRODUODENOSCOPY  08/15/2008   Moderate size hiatal hernia/Noncritical Schatzki's ring with superimposed distal esophageal erosion consistent with erosive reflux esophagitis  . ESOPHAGOGASTRODUODENOSCOPY N/A 06/11/2014   [id number] ring dilated/HH    There were no vitals filed for this visit.       Subjective Assessment - 10/27/16 0944    Subjective Mackenzie Mcpherson  states that she was having occasional trouble with her achilles tendonitis and would wrap her foot up with an ace wrap and things would be fine.  She tripped over a tree root  on 4/25 and thought that she had just strained her ankle again so she just wrapped her ankle with  An ace wrap and and kept going.  After three weeks her ankle was still hurting therefore she went to the MD and found out that it was fractured.   She was placed in a camboot for the ankle fx and is not suppose to walk without  the boot on until July 23rd.  She states that she went to a cookout and walked down a hill on Father's Day at which time she had increased pain and swelling.  She is now being referred to skilled physical therapy.     Pertinent History HTN   How long can you sit comfortably? no problem   How long can you walk comfortably? MD has limited to 3 hours at a time    Patient Stated Goals no pain be able to move like she use to    Currently in Pain? Yes   Pain Score 0-No pain  Occasional sharp pain 5/10 2x a day   Pain Location Ankle   Pain Orientation Left   Pain Descriptors / Indicators Stabbing   Pain Type Chronic pain   Pain Onset 1 to 4 weeks ago   Pain Frequency Intermittent  Aggravating Factors  turning certain directions.    Pain Relieving Factors ice    Effect of Pain on Daily Activities increases             [facility] PT Assessment - 10/27/16 0001      Assessment   Medical Diagnosis Lt Fx ankle with achilles tendonitis    Referring Provider Arther Abbott    Onset Date/Surgical Date 08/18/15  restraining of achilles 10/02/2016   Next MD Visit 11/14/2016     Precautions   Precautions None   Required Braces or Orthoses --  cam boot      Restrictions   Weight Bearing Restrictions No     Balance Screen   Has the patient fallen in the past 6 months Yes   How many times? 1   Has the patient had a decrease in activity level because of a fear of falling?  Yes   Is the patient reluctant to  leave their home because of a fear of falling?  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    Leisure walk the Community education officer   Overall Cognitive Status Within Functional Limits for tasks assessed     Observation/Other Assessments   Focus on Therapeutic Outcomes (FOTO)  51     Observation/Other Assessments-Edema    Edema --  Rt 21.3; Lt 22.7      ROM / Strength   AROM / PROM / Strength AROM;Strength     AROM   AROM Assessment Site Ankle   Right/Left Ankle Left   Left Ankle Dorsiflexion 5  right is 5 as well    Left Ankle Plantar Flexion 45  Rt : 45    Left Ankle Inversion 20  Rt 18    Left Ankle Eversion 10  Right 10      Strength   Strength Assessment Site Ankle   Right/Left Ankle Right;Left   Right Ankle Dorsiflexion 5/5   Right Ankle Plantar Flexion 4-/5  4- on right as well   Right Ankle Inversion 5/5   Right Ankle Eversion 3+/5     Palpation   Palpation comment thickening of gastroc tendon.             Objective measurements completed on examination: See above findings.    Long sitting:   ROM for all motion; t-band for plantarflexion and eversion  X 10               PT Education - 10/27/16 1131    Education provided Yes   Education Details HEP   Person(s) Educated Patient   Methods Explanation;Handout   Comprehension Returned demonstration;Verbalized understanding          PT Short Term Goals - 10/27/16 1141      PT SHORT TERM GOAL #1   Title Pt Lt dorsiflexion to be to 12 degrees to allow a normal heel toe gait    Time 2   Period Weeks   Status New     PT SHORT TERM GOAL #2   Title Pt pain in her left ankle to be no greater than a 2/10 to allow pt to walk for 30 minutes in comfort.   Time 2   Period Weeks   Status New  PT SHORT TERM GOAL #3   Title Pt to be able to single leg stance on both LE for at least 15 seconds to be confident walking up and down inclines.    Time 2   Period Weeks   Status New           PT Long Term Goals - 2016-11-19 1144      PT LONG TERM GOAL #1   Title Pt Lt ankle pain to be at a 0/10 to allow her to walk unlimited distances to return to her priot level of activity.   Time 4   Period Weeks   Status New     PT LONG TERM GOAL #2   Title Pt strength of her Lt ankle to be at 5/5 to allow her to go up and down steps in a reciprocal manner without discomfort    Time 4   Period Weeks   Status New     PT LONG TERM GOAL #3   Title Pt to be able to single leg stance on both LE for at least 30 seconds to be able to be confident when walking on uneven terrain.   Time 4   Period Weeks   Status New                Plan - November 19, 2016 1133    Clinical Impression Statement Ms. Marcelino fx her LT ankle in April.  She was unaware that it was fractured and continued to walk on it for three weeks thinking that she had just sprained her ankle.  She was placed in a Camboot and was doing fairly well until she walked down an incline on Father's day.  She is not to come out of the camboot until the 23rd of this month.  She is being referred to skilled physical therapy .  Evaluation demonstrates decreased ROM, decreased strength, increased pain and increased thickening of the gastroc soleus tendon.  Ms. Biancardi will benefit from physical therapy to address these issues as well as balance once she is able to come out of her camboot to decrease her pain and maximize her functional mobility.    History and Personal Factors relevant to plan of care: HTN   Clinical Presentation Stable   Clinical Decision Making Low   Rehab Potential Good   PT Frequency 2x / week   PT Duration 4 weeks   PT Treatment/Interventions ADLs/Self Care Home Management;Stair training;Gait training;Functional  mobility training;Therapeutic activities;Therapeutic exercise;Patient/family education;Manual techniques   PT Next Visit Plan test balance and begin Closed chain stretches, strengthening and proprioception exercises as well as gait training once pt is able to ambulate without the camboot.  Continue with manual therapy.   PT Home Exercise Plan Sitting heel/toe raises; Long sitting AROM and t-band exercises for strengthening.    Consulted and Agree with Plan of Care Patient      Patient will benefit from skilled therapeutic intervention in order to improve the following deficits and impairments:  Abnormal gait, Decreased activity tolerance, Decreased balance, Decreased range of motion, Decreased strength, Increased edema, Pain  Visit Diagnosis: Difficulty in walking, not elsewhere classified  Pain in left ankle and joints of left foot  Stiffness of left ankle, not elsewhere classified      G-Codes - 2016-11-19 1147    Functional Assessment Tool Used (Outpatient Only) fotot    Functional Limitation Other PT primary   Other PT Primary Current Status (A8341) At least 40 percent but less than 60  percent impaired, limited or restricted   Other PT Primary Goal Status (U7654) At least 20 percent but less than 40 percent impaired, limited or restricted       Problem List Patient Active Problem List   Diagnosis Date Noted  . Family hx of colon cancer   . Family history of colon cancer 12/22/2014  . Hiatal hernia   . Dysphagia 05/20/2014  . Schatzki's ring 05/20/2014  . Diverticulitis 12/21/2011  . Abdominal pain 12/21/2011  . Diarrhea 12/21/2011  . ARM PAIN, RIGHT 06/17/2010  . MEDIAL EPICONDYLITIS 05/05/2010  . LATERAL EPICONDYLITIS 05/05/2010  . BLOOD IN STOOL 11/18/2009  . OBESITY, UNSPECIFIED 07/30/2008  . GERD 07/30/2008  . DYSPHAGIA PHARYNGEAL PHASE 07/30/2008  . HYPERTENSION, HX OF 07/30/2008   Rayetta Humphrey, PT CLT [phone] 10/27/2016, 11:48 AM  [facility] [address] [place], [name], [address] Phone: [phone]   Fax:  [phone]  Name: Mackenzie Mcpherson MRN: [id number] Date of Birth: Jan 03, 1951

## 2016-10-27 NOTE — Patient Instructions (Signed)
ROM: Plantar / Dorsiflexion    With left leg relaxed, gently flex and extend ankle. Move through full range of motion. Avoid pain. Repeat 10____ times per set. Do ___1_ sets per session. Do 2____ sessions per day.  http://orth.exer.us/34   Copyright  VHI. All rights reserved.  ROM: Inversion / Eversion    With left leg relaxed, gently turn ankle and foot in and out. Move through full range of motion. Avoid pain. Repeat __10__ times per set. Do _1___ sets per session. Do _2___ sessions per day.  http://orth.exer.us/36   Copyright  VHI. All rights reserved.  Toe Raise (Sitting)    Raise toes, keeping heels on floor. Now raise up onto your heels  Repeat 10____ times per set. Do ___1_ sets per session. Do ___2_ sessions per day.  http://orth.exer.us/46   Copyright  VHI. All rights reserved.  Eversion: Resisted    With right foot in tubing loop, hold tubing around other foot to resist and turn foot out. Repeat __10__ times per set. Do __1__ sets per session. Do __2__ sessions per day.  http://orth.exer.us/14   Copyright  VHI. All rights reserved.  Plantar Flexion: Resisted    Anchor behind, tubing around left foot, press down. Repeat _10___ times per set. Do __1__ sets per session. Do ___2_ sessions per day.  http://orth.exer.us/10   Copyright  VHI. All rights reserved.

## 2016-10-28 ENCOUNTER — Ambulatory Visit (HOSPITAL_COMMUNITY): Payer: Federal, State, Local not specified - PPO

## 2016-10-28 DIAGNOSIS — R262 Difficulty in walking, not elsewhere classified: Secondary | ICD-10-CM

## 2016-10-28 DIAGNOSIS — M25572 Pain in left ankle and joints of left foot: Secondary | ICD-10-CM

## 2016-10-28 DIAGNOSIS — M25672 Stiffness of left ankle, not elsewhere classified: Secondary | ICD-10-CM

## 2016-10-28 NOTE — Therapy (Signed)
[place], [name], [address] Phone: [phone]   Fax:  [phone]  Physical Therapy Treatment  Patient Details  Name: Mackenzie Mcpherson MRN: [id number] Date of Birth: [birth year] Referring Provider: Arther Abbott   Encounter Date: 10/28/2016      PT End of Session - 10/28/16 0821    Visit Number 2   Number of Visits 8   Date for PT Re-Evaluation 11/26/16   Authorization Type BCBS   Authorization - Visit Number 2   Authorization - Number of Visits 8   PT Start Time 0816   PT Stop Time 0906   PT Time Calculation (min) 50 min   Activity Tolerance Patient tolerated treatment well   Behavior During Therapy [facility] for tasks assessed/performed      Past Medical History:  Diagnosis Date  . Diverticulitis 2010  . GERD (gastroesophageal reflux disease)    errosive esophagitis in 2010  . Hemorrhoids   . Hiatal hernia   . Hypertension   . Hypothyroidism   . Schatzki's ring    last dilation 2010    Past Surgical History:  Procedure Laterality Date  . ABDOMINAL HYSTERECTOMY  1986   w/ unilateral salpingoopherectomy (can't remember which one)  . APPENDECTOMY    . CHOLECYSTECTOMY    . COLONOSCOPY  11/19/2004   Normal rectum/Normal colon  . COLONOSCOPY  12/11/09   Rourk-minimally friable anal canal/hemorrhoids otherwise normal  . COLONOSCOPY N/A 02/11/2015   Procedure: COLONOSCOPY;  Surgeon: Daneil Dolin, MD;  [facility];  Service: Endoscopy;  Laterality: N/A;  1100 - moved to 10:30 - Candy notified pt  . ESOPHAGOGASTRODUODENOSCOPY  08/15/2008   Moderate size hiatal hernia/Noncritical Schatzki's ring with superimposed distal esophageal erosion consistent with erosive reflux esophagitis  . ESOPHAGOGASTRODUODENOSCOPY N/A 06/11/2014   [id number] ring dilated/HH    There were no vitals filed for this visit.      Subjective Assessment - 10/28/16 0818    Subjective Pt reports pain scale  3/10 Lt ankle mainly soreness.  Reports compliance wiht HEP   Pertinent History HTN   Patient Stated Goals no pain be able to move like she use to    Currently in Pain? Yes   Pain Score 3    Pain Location Ankle   Pain Orientation Left   Pain Descriptors / Indicators Sore   Pain Type Chronic pain   Pain Onset 1 to 4 weeks ago   Pain Frequency Intermittent   Aggravating Factors  turning certain directions   Pain Relieving Factors ice   Effect of Pain on Daily Activities increases               [facility] Adult PT Treatment/Exercise - 10/28/16 0001      Manual Therapy   Manual Therapy Soft tissue mobilization   Manual therapy comments done seperate from all other aspects of treatment   Soft tissue mobilization friction massage to achilles tendon      Ankle Exercises: Supine   T-Band dorsiflexion, plantarflexion, inversion, eversion 10x each BLE   Other Supine Ankle Exercises ROM for LT ankle x 10      Ankle Exercises: Seated   ABC's 1 rep   Ankle Circles/Pumps Left;10 reps  Cueing to reduce ROM with hip   Towel Crunch 1 rep   Towel Inversion/Eversion 1 rep   Heel Raises 10 reps   Toe Raise 10 reps  PT Education - 10/28/16 0824    Education provided Yes   Education Details Reviewed goals, assured compliance with HEP and copy of eval given to pt.     Person(s) Educated Patient   Methods Explanation;Demonstration;Handout   Comprehension Verbalized understanding;Returned demonstration;Need further instruction          PT Short Term Goals - 10/27/16 1141      PT SHORT TERM GOAL #1   Title Pt Lt dorsiflexion to be to 12 degrees to allow a normal heel toe gait    Time 2   Period Weeks   Status New     PT SHORT TERM GOAL #2   Title Pt pain in her left ankle to be no greater than a 2/10 to allow pt to walk for 30 minutes in comfort.   Time 2   Period Weeks   Status New     PT SHORT TERM GOAL #3   Title Pt to be able to single leg stance on  both LE for at least 15 seconds to be confident walking up and down inclines.    Time 2   Period Weeks   Status New           PT Long Term Goals - 10/27/16 1144      PT LONG TERM GOAL #1   Title Pt Lt ankle pain to be at a 0/10 to allow her to walk unlimited distances to return to her priot level of activity.   Time 4   Period Weeks   Status New     PT LONG TERM GOAL #2   Title Pt strength of her Lt ankle to be at 5/5 to allow her to go up and down steps in a reciprocal manner without discomfort    Time 4   Period Weeks   Status New     PT LONG TERM GOAL #3   Title Pt to be able to single leg stance on both LE for at least 30 seconds to be able to be confident when walking on uneven terrain.   Time 4   Period Weeks   Status New               Plan - 10/28/16 1610    Clinical Impression Statement Reviewed goals, assured compliance with cueing to improve form with HEP and copy of eval given to pt.  Session focus on ankle mobility to improve ROM and strengthening with open chain therex.  Cueing to reduce compensation with hip rotation and encouraged to increased focus on strickly ankle movements.     Rehab Potential Good   PT Frequency 2x / week   PT Duration 4 weeks   PT Treatment/Interventions ADLs/Self Care Home Management;Stair training;Gait training;Functional mobility training;Therapeutic activities;Therapeutic exercise;Patient/family education;Manual techniques   PT Next Visit Plan Next session begin seated BAPS.  Test balance and begin Closed chain stretches, strengthening and proprioception exercises as well as gait training once pt is able to ambulate without the camboot (7/23).  Continue with manual therapy.   PT Home Exercise Plan Sitting heel/toe raises; Long sitting AROM and t-band exercises for strengthening.       Patient will benefit from skilled therapeutic intervention in order to improve the following deficits and impairments:  Abnormal gait,  Decreased activity tolerance, Decreased balance, Decreased range of motion, Decreased strength, Increased edema, Pain  Visit Diagnosis: Difficulty in walking, not elsewhere classified  Pain in left ankle and joints of left foot  Stiffness of  left ankle, not elsewhere classified   Problem List Patient Active Problem List   Diagnosis Date Noted  . Family hx of colon cancer   . Family history of colon cancer 12/22/2014  . Hiatal hernia   . Dysphagia 05/20/2014  . Schatzki's ring 05/20/2014  . Diverticulitis 12/21/2011  . Abdominal pain 12/21/2011  . Diarrhea 12/21/2011  . ARM PAIN, RIGHT 06/17/2010  . MEDIAL EPICONDYLITIS 05/05/2010  . LATERAL EPICONDYLITIS 05/05/2010  . BLOOD IN STOOL 11/18/2009  . OBESITY, UNSPECIFIED 07/30/2008  . GERD 07/30/2008  . DYSPHAGIA PHARYNGEAL PHASE 07/30/2008  . HYPERTENSION, HX OF 07/30/2008   Ihor Austin, [place]; [place]  Aldona Lento 10/28/2016, 9:23 AM  [facility] [address] [place], [name], [address] Phone: [phone]   Fax:  [phone]  Name: SADAF PRZYBYSZ MRN: [id number] Date of Birth: 1951-03-13

## 2016-11-01 ENCOUNTER — Ambulatory Visit (HOSPITAL_COMMUNITY): Payer: Federal, State, Local not specified - PPO

## 2016-11-01 DIAGNOSIS — M25572 Pain in left ankle and joints of left foot: Secondary | ICD-10-CM

## 2016-11-01 DIAGNOSIS — M25672 Stiffness of left ankle, not elsewhere classified: Secondary | ICD-10-CM

## 2016-11-01 DIAGNOSIS — R262 Difficulty in walking, not elsewhere classified: Secondary | ICD-10-CM

## 2016-11-01 NOTE — Therapy (Signed)
[place], [name], [address] Phone: [phone]   Fax:  [phone]  Physical Therapy Treatment  Patient Details  Name: Mackenzie Mcpherson MRN: [id number] Date of Birth: [birth year] Referring Provider: Arther Abbott  Encounter Date: 11/01/2016      PT End of Session - 11/01/16 0820    Visit Number 3   Number of Visits 8   Date for PT Re-Evaluation 11/26/16   Authorization Type BCBS   Authorization - Visit Number 3   Authorization - Number of Visits 8   PT Start Time 0816   PT Stop Time 0903   PT Time Calculation (min) 47 min   Activity Tolerance Patient tolerated treatment well;No increased pain  Pain reduced to 3/10 at EOS   Behavior During Therapy [facility] for tasks assessed/performed      Past Medical History:  Diagnosis Date  . Diverticulitis 2010  . GERD (gastroesophageal reflux disease)    errosive esophagitis in 2010  . Hemorrhoids   . Hiatal hernia   . Hypertension   . Hypothyroidism   . Schatzki's ring    last dilation 2010    Past Surgical History:  Procedure Laterality Date  . ABDOMINAL HYSTERECTOMY  1986   w/ unilateral salpingoopherectomy (can't remember which one)  . APPENDECTOMY    . CHOLECYSTECTOMY    . COLONOSCOPY  11/19/2004   Normal rectum/Normal colon  . COLONOSCOPY  12/11/09   Rourk-minimally friable anal canal/hemorrhoids otherwise normal  . COLONOSCOPY N/A 02/11/2015   Procedure: COLONOSCOPY;  Surgeon: Daneil Dolin, MD;  [facility];  Service: Endoscopy;  Laterality: N/A;  1100 - moved to 10:30 - Candy notified pt  . ESOPHAGOGASTRODUODENOSCOPY  08/15/2008   Moderate size hiatal hernia/Noncritical Schatzki's ring with superimposed distal esophageal erosion consistent with erosive reflux esophagitis  . ESOPHAGOGASTRODUODENOSCOPY N/A 06/11/2014   OYD:XAJOINOM'V ring dilated/HH    There were no vitals filed for this visit.      Subjective Assessment -  11/01/16 0817    Subjective Pt reports she has been working on the heel to toe gait mechanics in boot, current pain scale 4-5/10 Lt ankle   Pertinent History HTN   Patient Stated Goals no pain be able to move like she use to    Currently in Pain? Yes   Pain Score 5    Pain Location Ankle   Pain Orientation Left   Pain Descriptors / Indicators Sharp   Pain Type Chronic pain   Pain Onset 1 to 4 weeks ago   Pain Frequency Intermittent   Aggravating Factors  turning certain directions   Pain Relieving Factors ice   Effect of Pain on Daily Activities increases            [facility] PT Assessment - 11/01/16 0001      Assessment   Medical Diagnosis Lt Fx ankle with achilles tendonitis    Referring Provider Arther Abbott   Onset Date/Surgical Date 08/18/15  restraining of achilles 10/02/16   Next MD Visit 11/14/2016                     [facility] Adult PT Treatment/Exercise - 11/01/16 0001      Manual Therapy   Manual Therapy Soft tissue mobilization   Manual therapy comments done seperate from all other aspects of treatment   Soft tissue mobilization friction massage to achilles tendon      Ankle Exercises: Seated   ABC's 1 rep  Towel Crunch 1 rep  increased time    Towel Inversion/Eversion 2 reps   Marble Pickup     Heel Raises 10 reps   Toe Raise 10 reps   BAPS Level 3;Sitting;10 reps  DF/PF; Inv/Ev; CW/CCWpain with PF/Inv     Ankle Exercises: Supine   T-Band dorsiflexion, plantarflexion, inversion, eversion 10x each BLE with GTB     Ankle Exercises: Standing   Other Standing Ankle Exercises stair training with CAM boot on 4in step cueing for mechanics 2RT 4in step height                  PT Short Term Goals - 10/27/16 1141      PT SHORT TERM GOAL #1   Title Pt Lt dorsiflexion to be to 12 degrees to allow a normal heel toe gait    Time 2   Period Weeks   Status New     PT SHORT TERM GOAL #2   Title Pt pain in her left ankle to be no greater  than a 2/10 to allow pt to walk for 30 minutes in comfort.   Time 2   Period Weeks   Status New     PT SHORT TERM GOAL #3   Title Pt to be able to single leg stance on both LE for at least 15 seconds to be confident walking up and down inclines.    Time 2   Period Weeks   Status New           PT Long Term Goals - 10/27/16 1144      PT LONG TERM GOAL #1   Title Pt Lt ankle pain to be at a 0/10 to allow her to walk unlimited distances to return to her priot level of activity.   Time 4   Period Weeks   Status New     PT LONG TERM GOAL #2   Title Pt strength of her Lt ankle to be at 5/5 to allow her to go up and down steps in a reciprocal manner without discomfort    Time 4   Period Weeks   Status New     PT LONG TERM GOAL #3   Title Pt to be able to single leg stance on both LE for at least 30 seconds to be able to be confident when walking on uneven terrain.   Time 4   Period Weeks   Status New               Plan - 11/01/16 1407    Clinical Impression Statement Added seated BAPS to improve ROM with ankle.  Continued session focus with mobility and strengthening with open chain therex.  Less cueing required this session to improve ankle movements and reduce compensation with hip rotation.  Reviewed gait mechanics with stair training with CAM boot, pt able to demonstrate safely with improved mechanics following cueing.  No reports of increased pain through session.     Rehab Potential Good   PT Frequency 2x / week   PT Duration 4 weeks   PT Treatment/Interventions ADLs/Self Care Home Management;Stair training;Gait training;Functional mobility training;Therapeutic activities;Therapeutic exercise;Patient/family education;Manual techniques   PT Next Visit Plan Add gastroc stretch with towel.  Continue open chain ankle mobility and strengthening til MD apt on 7/23.  Test balance and begin Closed chain stretches, strengthening and proprioception exercises as well as gait  training once pt is able to ambulate without the camboot (7/23).  Continue with manual  therapy.   PT Home Exercise Plan Sitting heel/toe raises; Long sitting AROM and t-band exercises for strengthening.       Patient will benefit from skilled therapeutic intervention in order to improve the following deficits and impairments:  Abnormal gait, Decreased activity tolerance, Decreased balance, Decreased range of motion, Decreased strength, Increased edema, Pain  Visit Diagnosis: Difficulty in walking, not elsewhere classified  Pain in left ankle and joints of left foot  Stiffness of left ankle, not elsewhere classified     Problem List Patient Active Problem List   Diagnosis Date Noted  . Family hx of colon cancer   . Family history of colon cancer 12/22/2014  . Hiatal hernia   . Dysphagia 05/20/2014  . Schatzki's ring 05/20/2014  . Diverticulitis 12/21/2011  . Abdominal pain 12/21/2011  . Diarrhea 12/21/2011  . ARM PAIN, RIGHT 06/17/2010  . MEDIAL EPICONDYLITIS 05/05/2010  . LATERAL EPICONDYLITIS 05/05/2010  . BLOOD IN STOOL 11/18/2009  . OBESITY, UNSPECIFIED 07/30/2008  . GERD 07/30/2008  . DYSPHAGIA PHARYNGEAL PHASE 07/30/2008  . HYPERTENSION, HX OF 07/30/2008   Ihor Austin, [place]; [place]  Aldona Lento 11/01/2016, 2:13 PM  [place], [name], [address] Phone: [phone]   Fax:  [phone]  Name: Mackenzie Mcpherson MRN: [id number] Date of Birth: 09-13-1950

## 2016-11-02 ENCOUNTER — Ambulatory Visit (HOSPITAL_COMMUNITY): Payer: Federal, State, Local not specified - PPO

## 2016-11-02 DIAGNOSIS — R262 Difficulty in walking, not elsewhere classified: Secondary | ICD-10-CM | POA: Diagnosis not present

## 2016-11-02 DIAGNOSIS — M25672 Stiffness of left ankle, not elsewhere classified: Secondary | ICD-10-CM

## 2016-11-02 DIAGNOSIS — M25572 Pain in left ankle and joints of left foot: Secondary | ICD-10-CM

## 2016-11-02 NOTE — Therapy (Signed)
[place] [address] [place], [name], [address] Phone: [phone]   Fax:  [phone]  Physical Therapy Treatment  Patient Details  Name: Mackenzie Mcpherson MRN: [id number] Date of Birth: 1950-05-04 Referring Provider: Arther Abbott  Encounter Date: 11/02/2016      PT End of Session - 11/02/16 1125    Visit Number 4   Number of Visits 8   Date for PT Re-Evaluation 11/26/16   Authorization Type BCBS   Authorization - Visit Number 4   Authorization - Number of Visits 8   PT Start Time 2952   PT Stop Time (P)  1207   PT Time Calculation (min) (P)  49 min   Activity Tolerance Patient tolerated treatment well;No increased pain   Behavior During Therapy [facility] for tasks assessed/performed      Past Medical History:  Diagnosis Date  . Diverticulitis 2010  . GERD (gastroesophageal reflux disease)    errosive esophagitis in 2010  . Hemorrhoids   . Hiatal hernia   . Hypertension   . Hypothyroidism   . Schatzki's ring    last dilation 2010    Past Surgical History:  Procedure Laterality Date  . ABDOMINAL HYSTERECTOMY  1986   w/ unilateral salpingoopherectomy (can't remember which one)  . APPENDECTOMY    . CHOLECYSTECTOMY    . COLONOSCOPY  11/19/2004   Normal rectum/Normal colon  . COLONOSCOPY  12/11/09   Rourk-minimally friable anal canal/hemorrhoids otherwise normal  . COLONOSCOPY N/A 02/11/2015   Procedure: COLONOSCOPY;  Surgeon: Daneil Dolin, MD;  [facility];  Service: Endoscopy;  Laterality: N/A;  1100 - moved to 10:30 - Candy notified pt  . ESOPHAGOGASTRODUODENOSCOPY  08/15/2008   Moderate size hiatal hernia/Noncritical Schatzki's ring with superimposed distal esophageal erosion consistent with erosive reflux esophagitis  . ESOPHAGOGASTRODUODENOSCOPY N/A 06/11/2014   WUX:[id number]'V ring dilated/HH    There were no vitals filed for this visit.      Subjective Assessment - 11/02/16 1123     Subjective Pt reports increased ease with stairs following last session.  No reports of pain today.   Patient Stated Goals no pain be able to move like she use to    Currently in Pain? No/denies                         [facility] Adult PT Treatment/Exercise - 11/02/16 0001      Manual Therapy   Manual Therapy Soft tissue mobilization   Manual therapy comments done seperate from all other aspects of treatment   Soft tissue mobilization friction massage to achilles tendon      Ankle Exercises: Seated   Towel Crunch 1 rep  decreased time to complete this session   Towel Inversion/Eversion 3 reps  1 1/2#   Marble Pickup 2 sets 5big 5 small   Heel Raises 15 reps   Toe Raise 15 reps   BAPS Level 3;Sitting;10 reps  all directions (no pain with any direction     Ankle Exercises: Supine   T-Band dorsiflexion, plantarflexion, inversion, eversion 15x each BLE with BTB     Ankle Exercises: Stretches   Gastroc Stretch 3 reps;30 seconds  long sit stretch with towel     Ankle Exercises: Standing   SLS Lt 17', Rt 13" max of 5 Lt LE in CAM boot                  PT Short Term  Goals - 10/27/16 1141      PT SHORT TERM GOAL #1   Title Pt Lt dorsiflexion to be to 12 degrees to allow a normal heel toe gait    Time 2   Period Weeks   Status New     PT SHORT TERM GOAL #2   Title Pt pain in her left ankle to be no greater than a 2/10 to allow pt to walk for 30 minutes in comfort.   Time 2   Period Weeks   Status New     PT SHORT TERM GOAL #3   Title Pt to be able to single leg stance on both LE for at least 15 seconds to be confident walking up and down inclines.    Time 2   Period Weeks   Status New           PT Long Term Goals - 10/27/16 1144      PT LONG TERM GOAL #1   Title Pt Lt ankle pain to be at a 0/10 to allow her to walk unlimited distances to return to her priot level of activity.   Time 4   Period Weeks   Status New     PT LONG TERM GOAL #2    Title Pt strength of her Lt ankle to be at 5/5 to allow her to go up and down steps in a reciprocal manner without discomfort    Time 4   Period Weeks   Status New     PT LONG TERM GOAL #3   Title Pt to be able to single leg stance on both LE for at least 30 seconds to be able to be confident when walking on uneven terrain.   Time 4   Period Weeks   Status New               Plan - 11/02/16 1254    Clinical Impression Statement Added marble pickup for intrinsic strengthening, gastroc stretch for ankle mobility and began SLS in CAM boot.  Pt progressing well with no reports of pain through session and increased speed noted with towel crunch.  Pt given gastroc stretch to begin at home to improve ROM.  No reoprts of pain through session.     Rehab Potential Good   PT Frequency 2x / week   PT Duration 4 weeks   PT Treatment/Interventions ADLs/Self Care Home Management;Stair training;Gait training;Functional mobility training;Therapeutic activities;Therapeutic exercise;Patient/family education;Manual techniques   PT Next Visit Plan Begin leg press machine for gastroc strengthening and continue SLS.  Continue open chain ankle mobility and strengthening til MD apt on 7/23.  Test balance and begin Closed chain stretches, strengthening and proprioception exercises as well as gait training once pt is able to ambulate without the camboot (7/23).  Continue with manual therapy.   PT Home Exercise Plan Sitting heel/toe raises; Long sitting AROM and t-band exercises for strengthening; seated gastroc stretch with towel      Patient will benefit from skilled therapeutic intervention in order to improve the following deficits and impairments:  Abnormal gait, Decreased activity tolerance, Decreased balance, Decreased range of motion, Decreased strength, Increased edema, Pain  Visit Diagnosis: Difficulty in walking, not elsewhere classified  Pain in left ankle and joints of left foot  Stiffness  of left ankle, not elsewhere classified     Problem List Patient Active Problem List   Diagnosis Date Noted  . Family hx of colon cancer   . Family history of  colon cancer 12/22/2014  . Hiatal hernia   . Dysphagia 05/20/2014  . Schatzki's ring 05/20/2014  . Diverticulitis 12/21/2011  . Abdominal pain 12/21/2011  . Diarrhea 12/21/2011  . ARM PAIN, RIGHT 06/17/2010  . MEDIAL EPICONDYLITIS 05/05/2010  . LATERAL EPICONDYLITIS 05/05/2010  . BLOOD IN STOOL 11/18/2009  . OBESITY, UNSPECIFIED 07/30/2008  . GERD 07/30/2008  . DYSPHAGIA PHARYNGEAL PHASE 07/30/2008  . HYPERTENSION, HX OF 07/30/2008   Ihor Austin, [place]; [place]  Aldona Lento 11/02/2016, 12:59 PM  [facility] [address] [place], [name], [address] Phone: [phone]   Fax:  [phone]  Name: Mackenzie Mcpherson MRN: [id number] Date of Birth: [birth year]

## 2016-11-08 ENCOUNTER — Ambulatory Visit (HOSPITAL_COMMUNITY): Payer: Federal, State, Local not specified - PPO | Admitting: Physical Therapy

## 2016-11-08 DIAGNOSIS — R262 Difficulty in walking, not elsewhere classified: Secondary | ICD-10-CM

## 2016-11-08 DIAGNOSIS — M25572 Pain in left ankle and joints of left foot: Secondary | ICD-10-CM

## 2016-11-08 DIAGNOSIS — M25672 Stiffness of left ankle, not elsewhere classified: Secondary | ICD-10-CM

## 2016-11-08 NOTE — Therapy (Signed)
[facility] [address] [place], [name], [address] Phone: [phone]   Fax:  [phone]  Physical Therapy Treatment  Patient Details  Name: Mackenzie Mcpherson MRN: [id number] Date of Birth: [birth year] Referring Provider: Arther Abbott  Encounter Date: 11/08/2016      PT End of Session - 11/08/16 1120    Visit Number 5   Number of Visits 8   Date for PT Re-Evaluation 11/26/16   Authorization Type BCBS   Authorization - Visit Number 5   Authorization - Number of Visits 8   PT Start Time 9485   PT Stop Time 1120   PT Time Calculation (min) 50 min   Activity Tolerance Patient tolerated treatment well;No increased pain   Behavior During Therapy WFL for tasks assessed/performed      Past Medical History:  Diagnosis Date  . Diverticulitis 2010  . GERD (gastroesophageal reflux disease)    errosive esophagitis in 2010  . Hemorrhoids   . Hiatal hernia   . Hypertension   . Hypothyroidism   . Schatzki's ring    last dilation 2010    Past Surgical History:  Procedure Laterality Date  . ABDOMINAL HYSTERECTOMY  1986   w/ unilateral salpingoopherectomy (can't remember which one)  . APPENDECTOMY    . CHOLECYSTECTOMY    . COLONOSCOPY  11/19/2004   Normal rectum/Normal colon  . COLONOSCOPY  12/11/09   Rourk-minimally friable anal canal/hemorrhoids otherwise normal  . COLONOSCOPY N/A 02/11/2015   Procedure: COLONOSCOPY;  Surgeon: Daneil Dolin, MD;  [facility];  Service: Endoscopy;  Laterality: N/A;  1100 - moved to 10:30 - Candy notified pt  . ESOPHAGOGASTRODUODENOSCOPY  08/15/2008   Moderate size hiatal hernia/Noncritical Schatzki's ring with superimposed distal esophageal erosion consistent with erosive reflux esophagitis  . ESOPHAGOGASTRODUODENOSCOPY N/A 06/11/2014   IOE:[id number]'G ring dilated/HH    There were no vitals filed for this visit.      Subjective Assessment - 11/08/16 1030    Subjective Pt  states that she went to [place] this weekend but she kept the ice on it and did her exercises.  Her pain is no that bad    Pertinent History HTN   Patient Stated Goals no pain be able to move like she use to    Currently in Pain? Yes   Pain Score 3    Pain Location Heel   Pain Orientation Left   Pain Descriptors / Indicators Aching   Pain Onset 1 to 4 weeks ago   Aggravating Factors  ice    Pain Relieving Factors stairs    Effect of Pain on Daily Activities increase pain    Multiple Pain Sites No                         [facility] Adult PT Treatment/Exercise - 11/08/16 0001      Manual Therapy   Manual Therapy Soft tissue mobilization   Manual therapy comments done seperate from all other aspects of treatment   Soft tissue mobilization friction massage to achilles tendon      Ankle Exercises: Seated   Towel Inversion/Eversion 5 reps  with isometric resistance    BAPS Level 4;Sitting;10 reps  all directions (no pain with any direction     Ankle Exercises: Stretches   Plantar Fascia Stretch 3 reps;30 seconds   Gastroc Stretch 3 reps;30 seconds  long sit stretch with towel     Ankle Exercises: Standing  SLS Lt 13', Rt 45" max of 5 Lt LE in CAM boot     Ankle Exercises: Machines for Strengthening   Cybex Leg Press 4pl x 10 pulling toes toward pt x10; ankle dorsi/plantar x 5                  PT Short Term Goals - 10/27/16 1141      PT SHORT TERM GOAL #1   Title Pt Lt dorsiflexion to be to 12 degrees to allow a normal heel toe gait    Time 2   Period Weeks   Status New     PT SHORT TERM GOAL #2   Title Pt pain in her left ankle to be no greater than a 2/10 to allow pt to walk for 30 minutes in comfort.   Time 2   Period Weeks   Status New     PT SHORT TERM GOAL #3   Title Pt to be able to single leg stance on both LE for at least 15 seconds to be confident walking up and down inclines.    Time 2   Period Weeks   Status New            PT Long Term Goals - 10/27/16 1144      PT LONG TERM GOAL #1   Title Pt Lt ankle pain to be at a 0/10 to allow her to walk unlimited distances to return to her priot level of activity.   Time 4   Period Weeks   Status New     PT LONG TERM GOAL #2   Title Pt strength of her Lt ankle to be at 5/5 to allow her to go up and down steps in a reciprocal manner without discomfort    Time 4   Period Weeks   Status New     PT LONG TERM GOAL #3   Title Pt to be able to single leg stance on both LE for at least 30 seconds to be able to be confident when walking on uneven terrain.   Time 4   Period Weeks   Status New               Plan - 11/08/16 1227    Clinical Impression Statement Therapist added plantar fascia stretch, SLS without boot as well as strengthening exercises with good tolerance from patient.  PT encouraged to wean out of the boot once she is cleared from her physican to come out of the boot rather than just stop wearing it all together.  Pt continues to progress in ROM and balance.    Rehab Potential Good   PT Frequency 2x / week   PT Duration 4 weeks   PT Treatment/Interventions ADLs/Self Care Home Management;Stair training;Gait training;Functional mobility training;Therapeutic activities;Therapeutic exercise;Patient/family education;Manual techniques   PT Next Visit Plan Begin tandem stance activity.    PT Home Exercise Plan Sitting heel/toe raises; Long sitting AROM and t-band exercises for strengthening; seated gastroc stretch with towel      Patient will benefit from skilled therapeutic intervention in order to improve the following deficits and impairments:  Abnormal gait, Decreased activity tolerance, Decreased balance, Decreased range of motion, Decreased strength, Increased edema, Pain  Visit Diagnosis: Difficulty in walking, not elsewhere classified  Pain in left ankle and joints of left foot  Stiffness of left ankle, not elsewhere  classified     Problem List Patient Active Problem List   Diagnosis Date Noted  .  Family hx of colon cancer   . Family history of colon cancer 12/22/2014  . Hiatal hernia   . Dysphagia 05/20/2014  . Schatzki's ring 05/20/2014  . Diverticulitis 12/21/2011  . Abdominal pain 12/21/2011  . Diarrhea 12/21/2011  . ARM PAIN, RIGHT 06/17/2010  . MEDIAL EPICONDYLITIS 05/05/2010  . LATERAL EPICONDYLITIS 05/05/2010  . BLOOD IN STOOL 11/18/2009  . OBESITY, UNSPECIFIED 07/30/2008  . GERD 07/30/2008  . DYSPHAGIA PHARYNGEAL PHASE 07/30/2008  . HYPERTENSION, HX OF 07/30/2008    Rayetta Humphrey, PT CLT [phone] 11/08/2016, 12:31 PM  [facility] [address] [place], [name], [address] Phone: [phone]   Fax:  [phone]  Name: Mackenzie Mcpherson MRN: [id number] Date of Birth: [birth year]

## 2016-11-10 ENCOUNTER — Ambulatory Visit (HOSPITAL_COMMUNITY): Payer: Federal, State, Local not specified - PPO

## 2016-11-10 DIAGNOSIS — R262 Difficulty in walking, not elsewhere classified: Secondary | ICD-10-CM | POA: Diagnosis not present

## 2016-11-10 DIAGNOSIS — M25672 Stiffness of left ankle, not elsewhere classified: Secondary | ICD-10-CM

## 2016-11-10 DIAGNOSIS — M25572 Pain in left ankle and joints of left foot: Secondary | ICD-10-CM

## 2016-11-10 NOTE — Therapy (Signed)
[facility] [address] [place], [name], [address] Phone: [phone]   Fax:  [phone]  Physical Therapy Treatment  Patient Details  Name: Mackenzie Mcpherson MRN: [id number] Date of Birth: 1950/07/24 Referring Provider: Arther Abbott  Encounter Date: 11/10/2016      PT End of Session - 11/10/16 0952    Visit Number 6   Number of Visits 8   Date for PT Re-Evaluation 11/26/16   Authorization Type BCBS   Authorization - Visit Number 6   Authorization - Number of Visits 8   PT Start Time 8144   PT Stop Time 8185   PT Time Calculation (min) 49 min   Activity Tolerance Patient tolerated treatment well;No increased pain   Behavior During Therapy WFL for tasks assessed/performed      Past Medical History:  Diagnosis Date  . Diverticulitis 2010  . GERD (gastroesophageal reflux disease)    errosive esophagitis in 2010  . Hemorrhoids   . Hiatal hernia   . Hypertension   . Hypothyroidism   . Schatzki's ring    last dilation 2010    Past Surgical History:  Procedure Laterality Date  . ABDOMINAL HYSTERECTOMY  1986   w/ unilateral salpingoopherectomy (can't remember which one)  . APPENDECTOMY    . CHOLECYSTECTOMY    . COLONOSCOPY  11/19/2004   Normal rectum/Normal colon  . COLONOSCOPY  12/11/09   Rourk-minimally friable anal canal/hemorrhoids otherwise normal  . COLONOSCOPY N/A 02/11/2015   Procedure: COLONOSCOPY;  Surgeon: Daneil Dolin, MD;  [facility];  Service: Endoscopy;  Laterality: N/A;  1100 - moved to 10:30 - Candy notified pt  . ESOPHAGOGASTRODUODENOSCOPY  08/15/2008   Moderate size hiatal hernia/Noncritical Schatzki's ring with superimposed distal esophageal erosion consistent with erosive reflux esophagitis  . ESOPHAGOGASTRODUODENOSCOPY N/A 06/11/2014   UDJ:[id number]'C ring dilated/HH    There were no vitals filed for this visit.      Subjective Assessment - 11/10/16 0951    Subjective Pt  stated ankle is feeling good today, was a little sore following last session.   Patient Stated Goals no pain be able to move like she use to    Currently in Pain? No/denies            [facility] PT Assessment - 11/10/16 0001      Assessment   Medical Diagnosis Lt Fx ankle with achilles tendonitis    Referring Provider Arther Abbott   Onset Date/Surgical Date 08/18/15   Next MD Visit 11/14/2016     Precautions   Precautions None   Required Braces or Orthoses --  CAM boot     AROM   Left Ankle Dorsiflexion 11  was 5   Left Ankle Plantar Flexion 48  was 45   Left Ankle Inversion 22  was 20   Left Ankle Eversion 12  was 10                     [facility] Adult PT Treatment/Exercise - 11/10/16 0001      Manual Therapy   Manual Therapy Soft tissue mobilization   Manual therapy comments done seperate from all other aspects of treatment   Soft tissue mobilization friction massage to achilles tendon      Ankle Exercises: Stretches   Gastroc Stretch 3 reps;30 seconds  long sit stretch with towel     Ankle Exercises: Standing   SLS Lt 23', Rt 18' max of 3 Lt LE in CAm  boot   Other Standing Ankle Exercises tandem stance 3x30"     Ankle Exercises: Seated   Towel Inversion/Eversion 5 reps   BAPS Level 4;Sitting;10 reps     Ankle Exercises: Machines for Strengthening   Cybex Leg Press 4pl x 10 pulling toes toward pt x10; ankle dorsi/plantar x 5                  PT Short Term Goals - 11/10/16 1007      PT SHORT TERM GOAL #1   Title Pt Lt dorsiflexion to be to 12 degrees to allow a normal heel toe gait      PT SHORT TERM GOAL #2   Title Pt pain in her left ankle to be no greater than a 2/10 to allow pt to walk for 30 minutes in comfort.   Baseline 11/10/16: Pt reports highest pain scale 2/10, able to walk with CAM boot for 20 minutes      PT SHORT TERM GOAL #3   Title Pt to be able to single leg stance on both LE for at least 15 seconds to be confident  walking up and down inclines.            PT Long Term Goals - 10/27/16 1144      PT LONG TERM GOAL #1   Title Pt Lt ankle pain to be at a 0/10 to allow her to walk unlimited distances to return to her priot level of activity.   Time 4   Period Weeks   Status New     PT LONG TERM GOAL #2   Title Pt strength of her Lt ankle to be at 5/5 to allow her to go up and down steps in a reciprocal manner without discomfort    Time 4   Period Weeks   Status New     PT LONG TERM GOAL #3   Title Pt to be able to single leg stance on both LE for at least 30 seconds to be able to be confident when walking on uneven terrain.   Time 4   Period Weeks   Status New               Plan - 11/10/16 1034    Clinical Impression Statement Added tandem stance to address balance deficits.  Continued with ankle mobility and strengthening exercises with good tolerance.  AROM Measurements complete prior MD apt next week.  Discussed weaning out of boot once she is clearned by MD to come out of boot.  No reports of pain through session.     Rehab Potential Good   PT Frequency 2x / week   PT Duration 4 weeks   PT Treatment/Interventions ADLs/Self Care Home Management;Stair training;Gait training;Functional mobility training;Therapeutic activities;Therapeutic exercise;Patient/family education;Manual techniques   PT Next Visit Plan MD apt next week, f/u with CAM boot.  Continue to address ankle mobility, strengthening and balance training per PT POC.   PT Home Exercise Plan Sitting heel/toe raises; Long sitting AROM and t-band exercises for strengthening; seated gastroc stretch with towel      Patient will benefit from skilled therapeutic intervention in order to improve the following deficits and impairments:  Abnormal gait, Decreased activity tolerance, Decreased balance, Decreased range of motion, Decreased strength, Increased edema, Pain  Visit Diagnosis: Difficulty in walking, not elsewhere  classified  Pain in left ankle and joints of left foot  Stiffness of left ankle, not elsewhere classified     Problem List  Patient Active Problem List   Diagnosis Date Noted  . Family hx of colon cancer   . Family history of colon cancer 12/22/2014  . Hiatal hernia   . Dysphagia 05/20/2014  . Schatzki's ring 05/20/2014  . Diverticulitis 12/21/2011  . Abdominal pain 12/21/2011  . Diarrhea 12/21/2011  . ARM PAIN, RIGHT 06/17/2010  . MEDIAL EPICONDYLITIS 05/05/2010  . LATERAL EPICONDYLITIS 05/05/2010  . BLOOD IN STOOL 11/18/2009  . OBESITY, UNSPECIFIED 07/30/2008  . GERD 07/30/2008  . DYSPHAGIA PHARYNGEAL PHASE 07/30/2008  . HYPERTENSION, HX OF 07/30/2008   Ihor Austin, LPTA; [place]  Aldona Lento 11/10/2016, 10:46 AM  [facility] [address] [place], [name], [address] Phone: [phone]   Fax:  [phone]  Name: Mackenzie Mcpherson MRN: [id number] Date of Birth: 1951-03-01

## 2016-11-14 ENCOUNTER — Ambulatory Visit (INDEPENDENT_AMBULATORY_CARE_PROVIDER_SITE_OTHER): Payer: Federal, State, Local not specified - PPO | Admitting: Orthopedic Surgery

## 2016-11-14 ENCOUNTER — Encounter: Payer: Self-pay | Admitting: Orthopedic Surgery

## 2016-11-14 DIAGNOSIS — S86012D Strain of left Achilles tendon, subsequent encounter: Secondary | ICD-10-CM | POA: Diagnosis not present

## 2016-11-14 NOTE — Progress Notes (Signed)
This is a follow-up visit  Chief Complaint  Patient presents with  . Follow-up    LEFT ACHILLES STRAIN, DOI 08/17/16   Encounter Diagnosis  Name Primary?  . 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 She actually was sent to the hospital for x-rays and the x-ray showed a lateral malleolus fracture.   She initially had pain in this area but localized to the back of her Achilles. She appears to have a chronic nodule at the Achilles insertion but no paresis complaints of pain there.   The patient has been in the boot since May. She has been going to physical therapy last visit was July 19 Patient will benefit from skilled therapeutic intervention in order to improve the following deficits and impairments:  Abnormal gait, Decreased activity tolerance, Decreased balance, Decreased range of motion, Decreased strength, Increased edema, Pain  Review of systems is benign today patient is feeling well no chest pain shortness of breath or numbness or tingling  She does report some improvement overall but still having pain even in the boot  Her exam shows heel to toe gait with the boot on tenderness around the Achilles tendon insertion has a nodule consistent with a spur passive dorsiflexion causes pain total range of motion is normal ankle is stable plantar flexion strength is excellent neurovascular exam is intact  Encounter Diagnosis  Name Primary?  . Strain of left Achilles tendon, subsequent encounter Yes    Continue physical therapy and Cam Walker  Return in 4 weeks

## 2016-11-15 ENCOUNTER — Ambulatory Visit (HOSPITAL_COMMUNITY): Payer: Federal, State, Local not specified - PPO

## 2016-11-15 DIAGNOSIS — R262 Difficulty in walking, not elsewhere classified: Secondary | ICD-10-CM | POA: Diagnosis not present

## 2016-11-15 DIAGNOSIS — M25572 Pain in left ankle and joints of left foot: Secondary | ICD-10-CM

## 2016-11-15 DIAGNOSIS — M25672 Stiffness of left ankle, not elsewhere classified: Secondary | ICD-10-CM

## 2016-11-15 NOTE — Therapy (Signed)
[facility] [address] [place], [name], [address] Phone: [phone]   Fax:  [phone]  Physical Therapy Treatment  Patient Details  Name: Mackenzie Mcpherson MRN: [id number] Date of Birth: Sep 17, 1950 Referring Provider: Arther Abbott  Encounter Date: 11/15/2016      PT End of Session - 11/15/16 1047    Visit Number 7   Number of Visits 8   Date for PT Re-Evaluation 11/26/16   Authorization Type BCBS   Authorization - Visit Number 7   Authorization - Number of Visits 8   PT Start Time 8466   PT Stop Time 5993   PT Time Calculation (min) 48 min   Activity Tolerance Patient tolerated treatment well;No increased pain   Behavior During Therapy WFL for tasks assessed/performed      Past Medical History:  Diagnosis Date  . Diverticulitis 2010  . GERD (gastroesophageal reflux disease)    errosive esophagitis in 2010  . Hemorrhoids   . Hiatal hernia   . Hypertension   . Hypothyroidism   . Schatzki's ring    last dilation 2010    Past Surgical History:  Procedure Laterality Date  . ABDOMINAL HYSTERECTOMY  1986   w/ unilateral salpingoopherectomy (can't remember which one)  . APPENDECTOMY    . CHOLECYSTECTOMY    . COLONOSCOPY  11/19/2004   Normal rectum/Normal colon  . COLONOSCOPY  12/11/09   Rourk-minimally friable anal canal/hemorrhoids otherwise normal  . COLONOSCOPY N/A 02/11/2015   Procedure: COLONOSCOPY;  Surgeon: Daneil Dolin, MD;  [facility];  Service: Endoscopy;  Laterality: N/A;  1100 - moved to 10:30 - Candy notified pt  . ESOPHAGOGASTRODUODENOSCOPY  08/15/2008   Moderate size hiatal hernia/Noncritical Schatzki's ring with superimposed distal esophageal erosion consistent with erosive reflux esophagitis  . ESOPHAGOGASTRODUODENOSCOPY N/A 06/11/2014   TTS:[id number]'E ring dilated/HH    There were no vitals filed for this visit.      Subjective Assessment - 11/15/16 1042    Subjective Pt  stated she had some sharp pain with MD palpation yesterday, current pain scale 3/10 posterior/lateral.  Instructed to continue with CAM boot for 4 more weeks   Patient Stated Goals no pain be able to move like she use to    Currently in Pain? Yes   Pain Score 3    Pain Location Heel   Pain Orientation Left   Pain Descriptors / Indicators Aching   Pain Type Chronic pain   Pain Onset 1 to 4 weeks ago   Pain Frequency Intermittent   Aggravating Factors  ice   Pain Relieving Factors stairs   Effect of Pain on Daily Activities increase pain                         [facility] Adult PT Treatment/Exercise - 11/15/16 0001      Manual Therapy   Manual Therapy Soft tissue mobilization   Manual therapy comments done seperate from all other aspects of treatment   Soft tissue mobilization friction massage to achilles tendon      Ankle Exercises: Seated   BAPS Sitting;Level 5;15 reps  each direction     Ankle Exercises: Stretches   Gastroc Stretch 3 reps;30 seconds     Ankle Exercises: Standing   SLS Lt 21", Rt 16" max of 3   Other Standing Ankle Exercises tandem stance 3x30" on foam   Other Standing Ankle Exercises tandem gait 1RT     Ankle  Exercises: Machines for Strengthening   Cybex Leg Press 4pl x 10 pulling toes toward pt x10; ankle dorsi/plantar x 5                  PT Short Term Goals - 11/10/16 1007      PT SHORT TERM GOAL #1   Title Pt Lt dorsiflexion to be to 12 degrees to allow a normal heel toe gait      PT SHORT TERM GOAL #2   Title Pt pain in her left ankle to be no greater than a 2/10 to allow pt to walk for 30 minutes in comfort.   Baseline 11/10/16: Pt reports highest pain scale 2/10, able to walk with CAM boot for 20 minutes      PT SHORT TERM GOAL #3   Title Pt to be able to single leg stance on both LE for at least 15 seconds to be confident walking up and down inclines.            PT Long Term Goals - 10/27/16 1144      PT LONG  TERM GOAL #1   Title Pt Lt ankle pain to be at a 0/10 to allow her to walk unlimited distances to return to her priot level of activity.   Time 4   Period Weeks   Status New     PT LONG TERM GOAL #2   Title Pt strength of her Lt ankle to be at 5/5 to allow her to go up and down steps in a reciprocal manner without discomfort    Time 4   Period Weeks   Status New     PT LONG TERM GOAL #3   Title Pt to be able to single leg stance on both LE for at least 30 seconds to be able to be confident when walking on uneven terrain.   Time 4   Period Weeks   Status New               Plan - 11/15/16 1236    Clinical Impression Statement Pt arrived following MD apt yesterday with recommendations to continue with CAM boot for 4 more weeks.  Continued session focus with ankle mobilty and strengthening exercises and balance training (continued with CAM boot on with weight bearing activities this session.)  Therapist messaged MD and received permission to begin strengthening activities out of boot, will begin next sessuion.     Rehab Potential Good   PT Frequency 2x / week   PT Duration 4 weeks   PT Treatment/Interventions ADLs/Self Care Home Management;Stair training;Gait training;Functional mobility training;Therapeutic activities;Therapeutic exercise;Patient/family education;Manual techniques   PT Next Visit Plan Reassess next session.  Continue to encourage pt to wear CAM boot out of clinic.  Next session begin strengthening exercises out of boot (heel/toe raises, SLS, slant board stretch...).  Continue to address ankle mobility, strengthening and balance training per PT POC.   PT Home Exercise Plan Sitting heel/toe raises; Long sitting AROM and t-band exercises for strengthening; seated gastroc stretch with towel      Patient will benefit from skilled therapeutic intervention in order to improve the following deficits and impairments:  Abnormal gait, Decreased activity tolerance, Decreased  balance, Decreased range of motion, Decreased strength, Increased edema, Pain  Visit Diagnosis: Difficulty in walking, not elsewhere classified  Pain in left ankle and joints of left foot  Stiffness of left ankle, not elsewhere classified     Problem List Patient Active Problem List  Diagnosis Date Noted  . Family hx of colon cancer   . Family history of colon cancer 12/22/2014  . Hiatal hernia   . Dysphagia 05/20/2014  . Schatzki's ring 05/20/2014  . Diverticulitis 12/21/2011  . Abdominal pain 12/21/2011  . Diarrhea 12/21/2011  . ARM PAIN, RIGHT 06/17/2010  . MEDIAL EPICONDYLITIS 05/05/2010  . LATERAL EPICONDYLITIS 05/05/2010  . BLOOD IN STOOL 11/18/2009  . OBESITY, UNSPECIFIED 07/30/2008  . GERD 07/30/2008  . DYSPHAGIA PHARYNGEAL PHASE 07/30/2008  . HYPERTENSION, HX OF 07/30/2008   Ihor Austin, [place]; [place]  Aldona Lento 11/15/2016, 12:44 PM  [facility] [address] [place], [name], [address] Phone: [phone]   Fax:  [phone]  Name: Mackenzie Mcpherson MRN: [id number] Date of Birth: 1950/08/03

## 2016-11-17 ENCOUNTER — Ambulatory Visit (HOSPITAL_COMMUNITY): Payer: Federal, State, Local not specified - PPO

## 2016-11-17 DIAGNOSIS — R262 Difficulty in walking, not elsewhere classified: Secondary | ICD-10-CM | POA: Diagnosis not present

## 2016-11-17 DIAGNOSIS — M25672 Stiffness of left ankle, not elsewhere classified: Secondary | ICD-10-CM

## 2016-11-17 DIAGNOSIS — M25572 Pain in left ankle and joints of left foot: Secondary | ICD-10-CM

## 2016-11-17 NOTE — Therapy (Signed)
[facility] [address] [place], [name], [address] Phone: [phone]   Fax:  [phone]  Physical Therapy Treatment  Patient Details  Name: Mackenzie Mcpherson MRN: [id number] Date of Birth: 02-14-1951 Referring Provider: Arther Abbott  Encounter Date: 11/17/2016      PT End of Session - 11/17/16 0951    Visit Number 8   Number of Visits 8   Date for PT Re-Evaluation 11/26/16   Authorization Type BCBS   Authorization - Visit Number 8   Authorization - Number of Visits 8   PT Start Time [phone]   PT Stop Time 1036   PT Time Calculation (min) 48 min   Activity Tolerance Patient tolerated treatment well;No increased pain   Behavior During Therapy WFL for tasks assessed/performed      Past Medical History:  Diagnosis Date  . Diverticulitis 2010  . GERD (gastroesophageal reflux disease)    errosive esophagitis in 2010  . Hemorrhoids   . Hiatal hernia   . Hypertension   . Hypothyroidism   . Schatzki's ring    last dilation 2010    Past Surgical History:  Procedure Laterality Date  . ABDOMINAL HYSTERECTOMY  1986   w/ unilateral salpingoopherectomy (can't remember which one)  . APPENDECTOMY    . CHOLECYSTECTOMY    . COLONOSCOPY  11/19/2004   Normal rectum/Normal colon  . COLONOSCOPY  12/11/09   Rourk-minimally friable anal canal/hemorrhoids otherwise normal  . COLONOSCOPY N/A 02/11/2015   Procedure: COLONOSCOPY;  Surgeon: Daneil Dolin, MD;  [facility];  Service: Endoscopy;  Laterality: N/A;  1100 - moved to 10:30 - Candy notified pt  . ESOPHAGOGASTRODUODENOSCOPY  08/15/2008   Moderate size hiatal hernia/Noncritical Schatzki's ring with superimposed distal esophageal erosion consistent with erosive reflux esophagitis  . ESOPHAGOGASTRODUODENOSCOPY N/A 06/11/2014   TML:[id number]'F ring dilated/HH    There were no vitals filed for this visit.      Subjective Assessment - 11/17/16 0950    Subjective Pt  stated she has some sharp pain with pressure on lateral aspect of ankle.  No reports of current pain.     Pertinent History HTN   Patient Stated Goals no pain be able to move like she use to    Currently in Pain? No/denies            [facility] PT Assessment - 11/17/16 0001      Assessment   Medical Diagnosis Lt Fx ankle with achilles tendonitis    Referring Provider Arther Abbott   Onset Date/Surgical Date 08/18/15     Precautions   Precautions None     AROM   Left Ankle Dorsiflexion 11  was 5 on eval   Left Ankle Plantar Flexion 48  45   Left Ankle Inversion 22  was 20   Left Ankle Eversion 18  was 10                     [facility] Adult PT Treatment/Exercise - 11/17/16 0001      Manual Therapy   Manual Therapy Soft tissue mobilization   Manual therapy comments done seperate from all other aspects of treatment   Soft tissue mobilization friction massage to achilles tendon      Ankle Exercises: Standing   SLS Lt 24", Rt 27" max of 3   Heel Raises 10 reps  on foam   Toe Raise 10 reps   Other Standing Ankle Exercises tandem stance 3x30" on foam  Other Standing Ankle Exercises tandem gait 1RT     Ankle Exercises: Seated   BAPS Sitting;15 reps;Level 4     Ankle Exercises: Stretches   Slant Board Stretch 3 reps;30 seconds     Ankle Exercises: Machines for Strengthening   Cybex Leg Press 4pl x 10 pulling toes toward pt x10; ankle dorsi/plantar x 5                  PT Short Term Goals - 11/10/16 1007      PT SHORT TERM GOAL #1   Title Pt Lt dorsiflexion to be to 12 degrees to allow a normal heel toe gait      PT SHORT TERM GOAL #2   Title Pt pain in her left ankle to be no greater than a 2/10 to allow pt to walk for 30 minutes in comfort.   Baseline 11/10/16: Pt reports highest pain scale 2/10, able to walk with CAM boot for 20 minutes      PT SHORT TERM GOAL #3   Title Pt to be able to single leg stance on both LE for at least 15 seconds  to be confident walking up and down inclines.            PT Long Term Goals - 10/27/16 1144      PT LONG TERM GOAL #1   Title Pt Lt ankle pain to be at a 0/10 to allow her to walk unlimited distances to return to her priot level of activity.   Time 4   Period Weeks   Status New     PT LONG TERM GOAL #2   Title Pt strength of her Lt ankle to be at 5/5 to allow her to go up and down steps in a reciprocal manner without discomfort    Time 4   Period Weeks   Status New     PT LONG TERM GOAL #3   Title Pt to be able to single leg stance on both LE for at least 30 seconds to be able to be confident when walking on uneven terrain.   Time 4   Period Weeks   Status New               Plan - 11/17/16 1042    Clinical Impression Statement This session progressed ankle strengthening out of CAM boot per permission from MD.  Began heel and toe raises as well as balance training activities out of boot.  Pt tolerated well with reports of slight increased pain following new activitie.  Reports of pain relief following manual soft tissue mobilization technqiues to gastroc/soleus complex.  Noted inflammation near achilles tendon.   AROM measurements complete with improvements every direction.     Rehab Potential Good   PT Duration 4 weeks   PT Treatment/Interventions ADLs/Self Care Home Management;Stair training;Gait training;Functional mobility training;Therapeutic activities;Therapeutic exercise;Patient/family education;Manual techniques   PT Next Visit Plan Reassess next session.     PT Home Exercise Plan Sitting heel/toe raises; Long sitting AROM and t-band exercises for strengthening; seated gastroc stretch with towel      Patient will benefit from skilled therapeutic intervention in order to improve the following deficits and impairments:  Abnormal gait, Decreased activity tolerance, Decreased balance, Decreased range of motion, Decreased strength, Increased edema, Pain  Visit  Diagnosis: Difficulty in walking, not elsewhere classified  Pain in left ankle and joints of left foot  Stiffness of left ankle, not elsewhere classified  Problem List Patient Active Problem List   Diagnosis Date Noted  . Family hx of colon cancer   . Family history of colon cancer 12/22/2014  . Hiatal hernia   . Dysphagia 05/20/2014  . Schatzki's ring 05/20/2014  . Diverticulitis 12/21/2011  . Abdominal pain 12/21/2011  . Diarrhea 12/21/2011  . ARM PAIN, RIGHT 06/17/2010  . MEDIAL EPICONDYLITIS 05/05/2010  . LATERAL EPICONDYLITIS 05/05/2010  . BLOOD IN STOOL 11/18/2009  . OBESITY, UNSPECIFIED 07/30/2008  . GERD 07/30/2008  . DYSPHAGIA PHARYNGEAL PHASE 07/30/2008  . HYPERTENSION, HX OF 07/30/2008   Ihor Austin, LPTA; [place]  Aldona Lento 11/17/2016, 10:53 AM  [facility] [address] [place], [name], [address] Phone: [phone]   Fax:  [phone]  Name: Mackenzie Mcpherson MRN: [id number] Date of Birth: 27-Sep-1950

## 2016-11-21 ENCOUNTER — Ambulatory Visit (HOSPITAL_COMMUNITY): Payer: Federal, State, Local not specified - PPO | Admitting: Physical Therapy

## 2016-11-21 DIAGNOSIS — R262 Difficulty in walking, not elsewhere classified: Secondary | ICD-10-CM | POA: Diagnosis not present

## 2016-11-21 DIAGNOSIS — M25672 Stiffness of left ankle, not elsewhere classified: Secondary | ICD-10-CM

## 2016-11-21 DIAGNOSIS — M25572 Pain in left ankle and joints of left foot: Secondary | ICD-10-CM

## 2016-11-21 NOTE — Addendum Note (Signed)
Addended by: Leeroy Cha on: 11/21/2016 12:26 PM   Modules accepted: Orders

## 2016-11-21 NOTE — Patient Instructions (Addendum)
Gastroc Stretch    Stand with left  foot back, leg straight, forward leg bent. Keeping heel on floor, turned slightly out, lean into wall until stretch is felt in calf. Hold 30____ seconds. Repeat _3___ times per set. Do _1___ sets per session. Do __2__ sessions per day.  http://orth.exer.us/26   Copyright  VHI. All rights reserved.  Plantar Fascia Stretch    Standing with only ball of left foot on stair, push heel down until stretch is felt through arch of foot. Hold ___30_ seconds. Relax. Repeat __3__ times per set. Do ___1_ sets per session. Do __2__ sessions per day.  http://orth.exer.us/22   Copyright  VHI. All rights reserved.  PRE: Eversion (Side-Lying)    With __3__ pound weight around left foot, big toe down, bend ankle up and turn foot out. Repeat _10___ times per set. Do _1___ sets per session. Do _2___ sessions per day.  http://orth.exer.us/58   Copyright  VHI. All rights reserved.

## 2016-11-21 NOTE — Therapy (Signed)
[facility], [name], [address] Phone: [phone]   Fax:  [phone]  Physical Therapy Treatment  Patient Details  Name: Mackenzie Mcpherson MRN: [id number] Date of Birth: 1950/09/28 Referring Provider: Arther Abbott   Encounter Date: 11/21/2016      PT End of Session - 11/21/16 1119    Visit Number 9   Number of Visits 16   Date for PT Re-Evaluation 11/26/16   Authorization Type BCBS   Authorization - Visit Number 9   Authorization - Number of Visits 16   PT Start Time 2778   PT Stop Time 1120   PT Time Calculation (min) 42 min   Activity Tolerance Patient tolerated treatment well;No increased pain   Behavior During Therapy WFL for tasks assessed/performed      Past Medical History:  Diagnosis Date  . Diverticulitis 2010  . GERD (gastroesophageal reflux disease)    errosive esophagitis in 2010  . Hemorrhoids   . Hiatal hernia   . Hypertension   . Hypothyroidism   . Schatzki's ring    last dilation 2010    Past Surgical History:  Procedure Laterality Date  . ABDOMINAL HYSTERECTOMY  1986   w/ unilateral salpingoopherectomy (can't remember which one)  . APPENDECTOMY    . CHOLECYSTECTOMY    . COLONOSCOPY  11/19/2004   Normal rectum/Normal colon  . COLONOSCOPY  12/11/09   Rourk-minimally friable anal canal/hemorrhoids otherwise normal  . COLONOSCOPY N/A 02/11/2015   Procedure: COLONOSCOPY;  Surgeon: Daneil Dolin, MD;  [facility];  Service: Endoscopy;  Laterality: N/A;  1100 - moved to 10:30 - Candy notified pt  . ESOPHAGOGASTRODUODENOSCOPY  08/15/2008   Moderate size hiatal hernia/Noncritical Schatzki's ring with superimposed distal esophageal erosion consistent with erosive reflux esophagitis  . ESOPHAGOGASTRODUODENOSCOPY N/A 06/11/2014   EUM:PNTIRWER'X ring dilated/HH    There were no vitals filed for this visit.      Subjective Assessment - 11/21/16 1037    Subjective Pt  states that she she is able to stand longer now.  She still is not walking without her cam boot per MD orders so she is not sure about walking without her cam boot.  She is stretching and using the theraband at home.     Pertinent History HTN   How long can you sit comfortably? no problem   How long can you stand comfortably?     How long can you walk comfortably? 4 hours at a time; restricted to this by MD   Patient Stated Goals no pain be able to move like she use to    Currently in Pain? No/denies  highest pain has been a 3/10             [facility] PT Assessment - 11/21/16 0001      Assessment   Medical Diagnosis Lt Fx ankle with achilles tendonitis    Referring Provider Arther Abbott    Onset Date/Surgical Date 08/18/15   Next MD Visit 12/16/2016     Precautions   Precautions None   Required Braces or Orthoses --  cam boot      Restrictions   Weight Bearing Restrictions No     Balance Screen   Has the patient fallen in the past 6 months No   Has the patient had a decrease in activity level because of a fear of falling?  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    Leisure walk the Community education officer   Overall Cognitive Status Within Functional Limits for tasks assessed     Observation/Other Assessments   Focus on Therapeutic Outcomes (FOTO)  51     Observation/Other Assessments-Edema    Edema --  Rt 21.3; Lt  21 was 22.7      Functional Tests   Functional tests Single leg stance     Single Leg Stance   Comments Lt 38 seconds; RT 60      AROM   Left Ankle Dorsiflexion 11  was 5 on eval has been 11 since 7/19   Left Ankle Plantar Flexion 50  45   Left Ankle Inversion 22  was 20   Left Ankle Eversion 10  was 10     Strength   Right Ankle Dorsiflexion 5/5    Right Ankle Plantar Flexion 3+/5  Pt Rt gastroc strength is 3/5    Right Ankle Inversion 5/5   Right Ankle Eversion 4/5  was 3+/5      Palpation   Palpation comment thickening of gastroc tendon.                      [facility] PT Treatment/Exercise - 11/21/16 0001      Exercises   Exercises Ankle     Manual Therapy   Manual Therapy Soft tissue mobilization   Manual therapy comments done seperate from all other aspects of treatment   Soft tissue mobilization friction massage to achilles tendon      Ankle Exercises: Stretches   Plantar Fascia Stretch 3 reps;30 seconds   Gastroc Stretch 3 reps;30 seconds   Slant Board Stretch 3 reps;30 seconds     Ankle Exercises: Machines for Strengthening   Cybex Leg Press --     Ankle Exercises: Standing   SLS Lt 24", Rt 27" max of 3   Heel Raises 10 reps  on foam   Toe Raise --   Other Standing Ankle Exercises --   Other Standing Ankle Exercises --     Ankle Exercises: Seated   BAPS --     Ankle Exercises: Sidelying   Ankle Eversion Left;10 reps;Weights   Ankle Eversion Weights (lbs) 3                  PT Short Term Goals - 11/21/16 1220      PT SHORT TERM GOAL #1   Title Pt Lt dorsiflexion to be to 12 degrees to allow a normal heel toe gait    Status Achieved     PT SHORT TERM GOAL #2   Title Pt pain in her left ankle to be no greater than a 2/10 to allow pt to walk for 30 minutes in comfort.   Baseline 11/10/16: Pt reports highest pain scale 2/10, able to walk with CAM boot for 20 minutes    Status Achieved     PT SHORT TERM GOAL #3   Title Pt to be able to single leg stance on both LE for at least 15 seconds to be confident walking up and down inclines.    Status Achieved           PT Long Term Goals - 11/21/16 1220      PT LONG TERM GOAL #  1   Title Pt Lt ankle pain to be at a 0/10 to allow her to walk unlimited distances to return to her priot level of activity.   Time 4   Period Weeks    Status On-going     PT LONG TERM GOAL #2   Title Pt strength of her Lt ankle to be at 5/5 to allow her to go up and down steps in a reciprocal manner without discomfort    Time 4   Period Weeks   Status On-going     PT LONG TERM GOAL #3   Title Pt to be able to single leg stance on both LE for at least 30 seconds to be able to be confident when walking on uneven terrain.   Time 4   Period Weeks   Status Partially Met  on 04-Dec-2022 Rt is 28 seconds;Lt 60                Plan - 2016/12/03 1120    Clinical Impression Statement Pt reassessed with pt showing improvement but continues to have limitations in ROM and strength.  Pt encouraged to complete stretches and strengthening at home.  PT will continue to benefit from skilled therapy to address deficits and maximize her functional ability.     Rehab Potential Good   PT Frequency 2x / week   PT Duration 8 weeks  continue for 4 weeks   PT Treatment/Interventions ADLs/Self Care Home Management;Stair training;Gait training;Functional mobility training;Therapeutic activities;Therapeutic exercise;Patient/family education;Manual techniques   PT Next Visit Plan Progress  weightbearing strengthening exercises to include standing heel walk, baps, rockerboard and balance master    PT Home Exercise Plan Sitting heel/toe raises; Long sitting AROM and t-band exercises for strengthening; seated gastroc stretch with towel      Patient will benefit from skilled therapeutic intervention in order to improve the following deficits and impairments:  Abnormal gait, Decreased activity tolerance, Decreased balance, Decreased range of motion, Decreased strength, Increased edema, Pain  Visit Diagnosis: Difficulty in walking, not elsewhere classified  Pain in left ankle and joints of left foot  Stiffness of left ankle, not elsewhere classified       G-Codes - Dec 03, 2016 1221    Functional Limitation Other PT primary   Other PT Primary Current Status  (J1884) At least 20 percent but less than 40 percent impaired, limited or restricted   Other PT Primary Goal Status (Z6606) At least 20 percent but less than 40 percent impaired, limited or restricted      Problem List Patient Active Problem List   Diagnosis Date Noted  . Family hx of colon cancer   . Family history of colon cancer 12/22/2014  . Hiatal hernia   . Dysphagia 05/20/2014  . Schatzki's ring 05/20/2014  . Diverticulitis 12/21/2011  . Abdominal pain 12/21/2011  . Diarrhea 12/21/2011  . ARM PAIN, RIGHT 06/17/2010  . MEDIAL EPICONDYLITIS 05/05/2010  . LATERAL EPICONDYLITIS 05/05/2010  . BLOOD IN STOOL 11/18/2009  . OBESITY, UNSPECIFIED 07/30/2008  . GERD 07/30/2008  . DYSPHAGIA PHARYNGEAL PHASE 07/30/2008  . HYPERTENSION, HX OF 07/30/2008    Rayetta Humphrey, PT CLT [phone] 12/03/16, 12:22 PM  [facility] [address] [place], [name], [address] Phone: [phone]   Fax:  [phone]  Name: AURA BIBBY MRN: [id number] Date of Birth: May 09, 1950

## 2016-11-22 ENCOUNTER — Encounter (HOSPITAL_COMMUNITY): Payer: Federal, State, Local not specified - PPO

## 2016-11-23 ENCOUNTER — Ambulatory Visit (HOSPITAL_COMMUNITY): Payer: Federal, State, Local not specified - PPO | Attending: Orthopedic Surgery | Admitting: Physical Therapy

## 2016-11-23 DIAGNOSIS — M25672 Stiffness of left ankle, not elsewhere classified: Secondary | ICD-10-CM | POA: Diagnosis present

## 2016-11-23 DIAGNOSIS — M25572 Pain in left ankle and joints of left foot: Secondary | ICD-10-CM | POA: Diagnosis present

## 2016-11-23 DIAGNOSIS — R262 Difficulty in walking, not elsewhere classified: Secondary | ICD-10-CM | POA: Diagnosis not present

## 2016-11-23 NOTE — Therapy (Signed)
[facility] [address] [place], [name], [address] Phone: [phone]   Fax:  [phone]  Physical Therapy Treatment  Patient Details  Name: Mackenzie Mcpherson MRN: [id number] Date of Birth: 12-04-1950 Referring Provider: Arther Abbott   Encounter Date: 11/23/2016      PT End of Session - 11/23/16 1211    Visit Number 10   Number of Visits 16   Date for PT Re-Evaluation 11/26/16   Authorization Type BCBS   Authorization - Visit Number 10   Authorization - Number of Visits 16   PT Start Time 2542   PT Stop Time 7062   PT Time Calculation (min) 47 min   Activity Tolerance Patient tolerated treatment well;No increased pain   Behavior During Therapy WFL for tasks assessed/performed      Past Medical History:  Diagnosis Date  . Diverticulitis 2010  . GERD (gastroesophageal reflux disease)    errosive esophagitis in 2010  . Hemorrhoids   . Hiatal hernia   . Hypertension   . Hypothyroidism   . Schatzki's ring    last dilation 2010    Past Surgical History:  Procedure Laterality Date  . ABDOMINAL HYSTERECTOMY  1986   w/ unilateral salpingoopherectomy (can't remember which one)  . APPENDECTOMY    . CHOLECYSTECTOMY    . COLONOSCOPY  11/19/2004   Normal rectum/Normal colon  . COLONOSCOPY  12/11/09   Rourk-minimally friable anal canal/hemorrhoids otherwise normal  . COLONOSCOPY N/A 02/11/2015   Procedure: COLONOSCOPY;  Surgeon: Daneil Dolin, MD;  [facility];  Service: Endoscopy;  Laterality: N/A;  1100 - moved to 10:30 - Candy notified pt  . ESOPHAGOGASTRODUODENOSCOPY  08/15/2008   Moderate size hiatal hernia/Noncritical Schatzki's ring with superimposed distal esophageal erosion consistent with erosive reflux esophagitis  . ESOPHAGOGASTRODUODENOSCOPY N/A 06/11/2014   BJS:EGBTDVVO'H ring dilated/HH    There were no vitals filed for this visit.      Subjective Assessment - 11/23/16 1007    Subjective  PT states she is having 2/10 pain.  Continued use of CAM boot per MD until returns in 3 weeks.  Per Ihor Austin, PTA, OK to begin standing WBAT exercises out of CAM boot            [facility] PT Assessment - 11/23/16 0001      Assessment   Medical Diagnosis Lt Fx ankle with achilles tendonitis    Next MD Visit 12/16/2016     Precautions   Precautions None   Precaution Comments OK to complete therex out of boot in standing                     [facility] Adult PT Treatment/Exercise - 11/23/16 0001      Manual Therapy   Manual Therapy Soft tissue mobilization   Manual therapy comments done seperate from all other aspects of treatment   Soft tissue mobilization friction massage to achilles tendon      Ankle Exercises: Stretches   Plantar Fascia Stretch 3 reps;20 seconds   Slant Board Stretch 3 reps;20 seconds     Ankle Exercises: Standing   BAPS Level 2;Standing;10 reps  Rt toe on board and use of B UE's; only 5 reps CW/CCW   Rocker Board Other (comment)  10 reps each direction with UE assist   Heel Raises 10 reps   Toe Raise 10 reps                PT  Education - 11/23/16 1520    Education provided Yes   Education Details proper footwear   Person(s) Educated Patient   Methods Explanation   Comprehension Verbalized understanding          PT Short Term Goals - 11/21/16 1220      PT SHORT TERM GOAL #1   Title Pt Lt dorsiflexion to be to 12 degrees to allow a normal heel toe gait    Status Achieved     PT SHORT TERM GOAL #2   Title Pt pain in her left ankle to be no greater than a 2/10 to allow pt to walk for 30 minutes in comfort.   Baseline 11/10/16: Pt reports highest pain scale 2/10, able to walk with CAM boot for 20 minutes    Status Achieved     PT SHORT TERM GOAL #3   Title Pt to be able to single leg stance on both LE for at least 15 seconds to be confident walking up and down inclines.    Status Achieved           PT Long Term Goals  - 11/21/16 1220      PT LONG TERM GOAL #1   Title Pt Lt ankle pain to be at a 0/10 to allow her to walk unlimited distances to return to her priot level of activity.   Time 4   Period Weeks   Status On-going     PT LONG TERM GOAL #2   Title Pt strength of her Lt ankle to be at 5/5 to allow her to go up and down steps in a reciprocal manner without discomfort    Time 4   Period Weeks   Status On-going     PT LONG TERM GOAL #3   Title Pt to be able to single leg stance on both LE for at least 30 seconds to be able to be confident when walking on uneven terrain.   Time 4   Period Weeks   Status Partially Met  on 7/30 Rt is 28 seconds;Lt 60                Plan - 11/23/16 1518    Clinical Impression Statement Progressed standing exercises including BAPS, rockerboard and standing stretches.  Pt able to complete with only mild discomfort.  Counseled on wearing correct footwear as she had on flip flops and brought slip on tennis shoes (without backs) to change into for therapy.  Pt reported she would bring string up tennis shoes next session.  overall progressing well.     Rehab Potential Good   PT Frequency 2x / week   PT Duration 8 weeks  continue for 4 weeks   PT Treatment/Interventions ADLs/Self Care Home Management;Stair training;Gait training;Functional mobility training;Therapeutic activities;Therapeutic exercise;Patient/family education;Manual techniques   PT Next Visit Plan Progress  weightbearing strengthening exercises to include standing heel walk and balance master    PT Home Exercise Plan Sitting heel/toe raises; Long sitting AROM and t-band exercises for strengthening; seated gastroc stretch with towel      Patient will benefit from skilled therapeutic intervention in order to improve the following deficits and impairments:  Abnormal gait, Decreased activity tolerance, Decreased balance, Decreased range of motion, Decreased strength, Increased edema, Pain  Visit  Diagnosis: Difficulty in walking, not elsewhere classified  Pain in left ankle and joints of left foot  Stiffness of left ankle, not elsewhere classified     Problem List Patient Active Problem List  Diagnosis Date Noted  . Family hx of colon cancer   . Family history of colon cancer 12/22/2014  . Hiatal hernia   . Dysphagia 05/20/2014  . Schatzki's ring 05/20/2014  . Diverticulitis 12/21/2011  . Abdominal pain 12/21/2011  . Diarrhea 12/21/2011  . ARM PAIN, RIGHT 06/17/2010  . MEDIAL EPICONDYLITIS 05/05/2010  . LATERAL EPICONDYLITIS 05/05/2010  . BLOOD IN STOOL 11/18/2009  . OBESITY, UNSPECIFIED 07/30/2008  . GERD 07/30/2008  . DYSPHAGIA PHARYNGEAL PHASE 07/30/2008  . HYPERTENSION, HX OF 07/30/2008   Teena Irani, PTA/CLT [phone]  Teena Irani 11/23/2016, 3:21 PM  [facility] [address] [place], [name], [address] Phone: [phone]   Fax:  [phone]  Name: LAQUASHIA MERGENTHALER MRN: [id number] Date of Birth: February 20, 1951

## 2016-11-28 ENCOUNTER — Ambulatory Visit (HOSPITAL_COMMUNITY): Payer: Federal, State, Local not specified - PPO | Admitting: Physical Therapy

## 2016-11-28 DIAGNOSIS — R262 Difficulty in walking, not elsewhere classified: Secondary | ICD-10-CM

## 2016-11-28 DIAGNOSIS — M25672 Stiffness of left ankle, not elsewhere classified: Secondary | ICD-10-CM

## 2016-11-28 DIAGNOSIS — M25572 Pain in left ankle and joints of left foot: Secondary | ICD-10-CM

## 2016-11-28 NOTE — Therapy (Signed)
[facility] [place], [name], [address] Phone: [phone]   Fax:  [phone]  Physical Therapy Treatment  Patient Details  Name: Mackenzie Mcpherson MRN: [id number] Date of Birth: April 28, 1950 Referring Provider: Arther Abbott   Encounter Date: 11/28/2016      PT End of Session - 11/28/16 1210    Visit Number 11   Number of Visits 16   Date for PT Re-Evaluation 11/26/16   Authorization Type BCBS   Authorization - Visit Number 11   Authorization - Number of Visits 16   PT Start Time 0946   PT Stop Time 1030   PT Time Calculation (min) 44 min   Activity Tolerance Patient tolerated treatment well;No increased pain   Behavior During Therapy WFL for tasks assessed/performed      Past Medical History:  Diagnosis Date  . Diverticulitis 2010  . GERD (gastroesophageal reflux disease)    errosive esophagitis in 2010  . Hemorrhoids   . Hiatal hernia   . Hypertension   . Hypothyroidism   . Schatzki's ring    last dilation 2010    Past Surgical History:  Procedure Laterality Date  . ABDOMINAL HYSTERECTOMY  1986   w/ unilateral salpingoopherectomy (can't remember which one)  . APPENDECTOMY    . CHOLECYSTECTOMY    . COLONOSCOPY  11/19/2004   Normal rectum/Normal colon  . COLONOSCOPY  12/11/09   Rourk-minimally friable anal canal/hemorrhoids otherwise normal  . COLONOSCOPY N/A 02/11/2015   Procedure: COLONOSCOPY;  Surgeon: Daneil Dolin, MD;  [facility];  Service: Endoscopy;  Laterality: N/A;  1100 - moved to 10:30 - Candy notified pt  . ESOPHAGOGASTRODUODENOSCOPY  08/15/2008   Moderate size hiatal hernia/Noncritical Schatzki's ring with superimposed distal esophageal erosion consistent with erosive reflux esophagitis  . ESOPHAGOGASTRODUODENOSCOPY N/A 06/11/2014   WPY:[id number]'N ring dilated/HH    There were no vitals filed for this visit.      Subjective Assessment - 11/28/16 1214    Subjective  Pt reports no pain or issues.    Currently in Pain? No/denies                         [facility] Adult PT Treatment/Exercise - 11/28/16 0001      Manual Therapy   Manual Therapy --   Manual therapy comments --   Soft tissue mobilization --     Ankle Exercises: Stretches   Plantar Fascia Stretch 3 reps;20 seconds   Slant Board Stretch 3 reps;20 seconds   Other Stretch gait without AD or CAM boot 226 feet     Ankle Exercises: Standing   BAPS Level 2;Standing;10 reps   Vector Stance 5 reps;5 seconds;Left   SLS Lt 24", Rt 27" max of 3   Heel Raises 15 reps   Toe Raise 15 reps   Other Standing Ankle Exercises tandem stance 3x30" on foam   Other Standing Ankle Exercises lat and forward step ups 4" 1 HHA 10 reps Lt LE                  PT Short Term Goals - 11/21/16 1220      PT SHORT TERM GOAL #1   Title Pt Lt dorsiflexion to be to 12 degrees to allow a normal heel toe gait    Status Achieved     PT SHORT TERM GOAL #2   Title Pt pain in her left ankle to be no greater than  a 2/10 to allow pt to walk for 30 minutes in comfort.   Baseline 11/10/16: Pt reports highest pain scale 2/10, able to walk with CAM boot for 20 minutes    Status Achieved     PT SHORT TERM GOAL #3   Title Pt to be able to single leg stance on both LE for at least 15 seconds to be confident walking up and down inclines.    Status Achieved           PT Long Term Goals - 11/21/16 1220      PT LONG TERM GOAL #1   Title Pt Lt ankle pain to be at a 0/10 to allow her to walk unlimited distances to return to her priot level of activity.   Time 4   Period Weeks   Status On-going     PT LONG TERM GOAL #2   Title Pt strength of her Lt ankle to be at 5/5 to allow her to go up and down steps in a reciprocal manner without discomfort    Time 4   Period Weeks   Status On-going     PT LONG TERM GOAL #3   Title Pt to be able to single leg stance on both LE for at least 30 seconds to be  able to be confident when walking on uneven terrain.   Time 4   Period Weeks   Status Partially Met  on 7/30 Rt is 28 seconds;Lt 60                Plan - 11/28/16 1210    Clinical Impression Statement Pt brought string up tennis shoes today for therapy.  Began ambulation in department with good heel toe gait and no antalgia.  Progressed with step ups and vectors this session.  Pt without increased pain or issues at EOS.   Rehab Potential Good   PT Frequency 2x / week   PT Duration 8 weeks  continue for 4 weeks   PT Treatment/Interventions ADLs/Self Care Home Management;Stair training;Gait training;Functional mobility training;Therapeutic activities;Therapeutic exercise;Patient/family education;Manual techniques   PT Next Visit Plan Progress  weightbearing strengthening exercises to include standing heel walk and balance master    PT Home Exercise Plan Sitting heel/toe raises; Long sitting AROM and t-band exercises for strengthening; seated gastroc stretch with towel      Patient will benefit from skilled therapeutic intervention in order to improve the following deficits and impairments:  Abnormal gait, Decreased activity tolerance, Decreased balance, Decreased range of motion, Decreased strength, Increased edema, Pain  Visit Diagnosis: Difficulty in walking, not elsewhere classified  Pain in left ankle and joints of left foot  Stiffness of left ankle, not elsewhere classified     Problem List Patient Active Problem List   Diagnosis Date Noted  . Family hx of colon cancer   . Family history of colon cancer 12/22/2014  . Hiatal hernia   . Dysphagia 05/20/2014  . Schatzki's ring 05/20/2014  . Diverticulitis 12/21/2011  . Abdominal pain 12/21/2011  . Diarrhea 12/21/2011  . ARM PAIN, RIGHT 06/17/2010  . MEDIAL EPICONDYLITIS 05/05/2010  . LATERAL EPICONDYLITIS 05/05/2010  . BLOOD IN STOOL 11/18/2009  . OBESITY, UNSPECIFIED 07/30/2008  . GERD 07/30/2008  . DYSPHAGIA  PHARYNGEAL PHASE 07/30/2008  . HYPERTENSION, HX OF 07/30/2008   Teena Irani, PTA/CLT [phone]   Teena Irani 11/28/2016, 12:14 PM  [facility] [address] [place], [name], [address] Phone: [phone]  Fax:  [phone]  Name: Mackenzie Mcpherson MRN: [id number] Date of Birth: 06-21-1950

## 2016-11-29 ENCOUNTER — Other Ambulatory Visit: Payer: Self-pay | Admitting: Nurse Practitioner

## 2016-11-30 ENCOUNTER — Ambulatory Visit (HOSPITAL_COMMUNITY): Payer: Federal, State, Local not specified - PPO

## 2016-11-30 ENCOUNTER — Encounter (HOSPITAL_COMMUNITY): Payer: Self-pay

## 2016-11-30 DIAGNOSIS — M25572 Pain in left ankle and joints of left foot: Secondary | ICD-10-CM

## 2016-11-30 DIAGNOSIS — R262 Difficulty in walking, not elsewhere classified: Secondary | ICD-10-CM

## 2016-11-30 DIAGNOSIS — M25672 Stiffness of left ankle, not elsewhere classified: Secondary | ICD-10-CM

## 2016-11-30 NOTE — Therapy (Signed)
[place], [name], [address] Phone: [phone]   Fax:  [phone]  Physical Therapy Treatment  Patient Details  Name: Mackenzie Mcpherson MRN: [id number] Date of Birth: 10-29-1950 Referring Provider: Arther Abbott   Encounter Date: 11/30/2016      PT End of Session - 11/30/16 0950    Visit Number 12   Number of Visits 16   Date for PT Re-Evaluation 11/26/16   Authorization Type BC[employer]   Authorization - Visit Number 12   Authorization - Number of Visits 16   PT Start Time 5638   PT Stop Time 1030   PT Time Calculation (min) 42 min   Activity Tolerance Patient tolerated treatment well;No increased pain   Behavior During Therapy WFL for tasks assessed/performed      Past Medical History:  Diagnosis Date  . Diverticulitis 2010  . GERD (gastroesophageal reflux disease)    errosive esophagitis in 2010  . Hemorrhoids   . Hiatal hernia   . Hypertension   . Hypothyroidism   . Schatzki's ring    last dilation 2010    Past Surgical History:  Procedure Laterality Date  . ABDOMINAL HYSTERECTOMY  1986   w/ unilateral salpingoopherectomy (can't remember which one)  . APPENDECTOMY    . CHOLECYSTECTOMY    . COLONOSCOPY  11/19/2004   Normal rectum/Normal colon  . COLONOSCOPY  12/11/09   Rourk-minimally friable anal canal/hemorrhoids otherwise normal  . COLONOSCOPY N/A 02/11/2015   Procedure: COLONOSCOPY;  Surgeon: Daneil Dolin, MD;  [facility];  Service: Endoscopy;  Laterality: N/A;  1100 - moved to 10:30 - Candy notified pt  . ESOPHAGOGASTRODUODENOSCOPY  08/15/2008   Moderate size hiatal hernia/Noncritical Schatzki's ring with superimposed distal esophageal erosion consistent with erosive reflux esophagitis  . ESOPHAGOGASTRODUODENOSCOPY N/A 06/11/2014   VFI:[id number]'A ring dilated/HH    There were no vitals filed for this visit.      Subjective Assessment - 11/30/16 0950    Subjective  Pt denies any pain currently. She denies any soreness following last session.    Currently in Pain? No/denies             [facility] Adult PT Treatment/Exercise - 11/30/16 0001      Ankle Exercises: Stretches   Plantar Fascia Stretch 3 reps;30 seconds   Slant Board Stretch 3 reps;30 seconds     Ankle Exercises: Standing   BAPS Standing;Level 2;15 reps  DF, PF, CCW, CW   SLS L: 30 sec on 3rd attempt; R: 30 sec on first attempt; SLS on foam 5x10" each   Heel Raises 10 reps  2 sets, SL eccentric lowering   Other Standing Ankle Exercises gait x127f focusing on heel to toe gait; heel and toe walking x556feach;  L SL isometric heel raise 3x10" each   Other Standing Ankle Exercises fwd/lat step ups 4" x15 reps no UE; mini squats x10 reps            PT Education - 11/30/16 1029    Education provided Yes   Education Details exercise technique, continue HEP, may be sore from today's session   Person(s) Educated Patient   Methods Explanation;Demonstration   Comprehension Verbalized understanding;Returned demonstration          PT Short Term Goals - 11/21/16 1220      PT SHORT TERM GOAL #1   Title Pt Lt dorsiflexion to be to 12 degrees to allow a normal heel toe gait  Status Achieved     PT SHORT TERM GOAL #2   Title Pt pain in her left ankle to be no greater than a 2/10 to allow pt to walk for 30 minutes in comfort.   Baseline 11/10/16: Pt reports highest pain scale 2/10, able to walk with CAM boot for 20 minutes    Status Achieved     PT SHORT TERM GOAL #3   Title Pt to be able to single leg stance on both LE for at least 15 seconds to be confident walking up and down inclines.    Status Achieved           PT Long Term Goals - 11/21/16 1220      PT LONG TERM GOAL #1   Title Pt Lt ankle pain to be at a 0/10 to allow her to walk unlimited distances to return to her priot level of activity.   Time 4   Period Weeks   Status On-going     PT LONG TERM GOAL #2    Title Pt strength of her Lt ankle to be at 5/5 to allow her to go up and down steps in a reciprocal manner without discomfort    Time 4   Period Weeks   Status On-going     PT LONG TERM GOAL #3   Title Pt to be able to single leg stance on both LE for at least 30 seconds to be able to be confident when walking on uneven terrain.   Time 4   Period Weeks   Status Partially Met  on 7/30 Rt is 28 seconds;Lt 60                Plan - 11/30/16 1030    Clinical Impression Statement Pt presented with tennis shoe again so therapy was performed out of CAM boot again. Progressed pt's CKC strengthening this date with no reports of pain, just increased fatigue. Introduced pt to eccentric strengthening this date; she did not have any pain, just increased fatigue again. Continue POC as planned.    Rehab Potential Good   PT Frequency 2x / week   PT Duration 8 weeks  continue for 4 weeks   PT Treatment/Interventions ADLs/Self Care Home Management;Stair training;Gait training;Functional mobility training;Therapeutic activities;Therapeutic exercise;Patient/family education;Manual techniques   PT Next Visit Plan Continue to progress weightbearing strengthening exercises to pt's tolerance; add in balance master next session and SLS with vectors on foam   PT Home Exercise Plan Sitting heel/toe raises; Long sitting AROM and t-band exercises for strengthening; seated gastroc stretch with towel   Consulted and Agree with Plan of Care Patient      Patient will benefit from skilled therapeutic intervention in order to improve the following deficits and impairments:  Abnormal gait, Decreased activity tolerance, Decreased balance, Decreased range of motion, Decreased strength, Increased edema, Pain  Visit Diagnosis: Difficulty in walking, not elsewhere classified  Pain in left ankle and joints of left foot  Stiffness of left ankle, not elsewhere classified     Problem List Patient Active Problem  List   Diagnosis Date Noted  . Family hx of colon cancer   . Family history of colon cancer 12/22/2014  . Hiatal hernia   . Dysphagia 05/20/2014  . Schatzki's ring 05/20/2014  . Diverticulitis 12/21/2011  . Abdominal pain 12/21/2011  . Diarrhea 12/21/2011  . ARM PAIN, RIGHT 06/17/2010  . MEDIAL EPICONDYLITIS 05/05/2010  . LATERAL EPICONDYLITIS 05/05/2010  . BLOOD IN STOOL 11/18/2009  .  OBESITY, UNSPECIFIED 07/30/2008  . GERD 07/30/2008  . DYSPHAGIA PHARYNGEAL PHASE 07/30/2008  . HYPERTENSION, HX OF 07/30/2008     Geraldine Solar PT, DPT  [facility] [address] [place], [name], [address] Phone: [phone]   Fax:  [phone]  Name: Mackenzie Mcpherson MRN: [id number] Date of Birth: [birth year]

## 2016-12-06 ENCOUNTER — Ambulatory Visit (HOSPITAL_COMMUNITY): Payer: Federal, State, Local not specified - PPO | Admitting: Physical Therapy

## 2016-12-06 DIAGNOSIS — R262 Difficulty in walking, not elsewhere classified: Secondary | ICD-10-CM | POA: Diagnosis not present

## 2016-12-06 DIAGNOSIS — M25572 Pain in left ankle and joints of left foot: Secondary | ICD-10-CM

## 2016-12-06 NOTE — Therapy (Signed)
[place], [name], [address] Phone: [phone]   Fax:  [phone]  Physical Therapy Treatment  Patient Details  Name: Mackenzie Mcpherson MRN: [id number] Date of Birth: May 08, 1950 Referring Provider: Arther Abbott   Encounter Date: 12/06/2016      PT End of Session - 12/06/16 1041    Visit Number 13   Number of Visits 16   Date for PT Re-Evaluation 11/26/16   Authorization Type BCBS   Authorization - Visit Number 13   Authorization - Number of Visits 16   PT Start Time 0814   PT Stop Time 1116   PT Time Calculation (min) 38 min   Activity Tolerance Patient tolerated treatment well;No increased pain   Behavior During Therapy WFL for tasks assessed/performed      Past Medical History:  Diagnosis Date  . Diverticulitis 2010  . GERD (gastroesophageal reflux disease)    errosive esophagitis in 2010  . Hemorrhoids   . Hiatal hernia   . Hypertension   . Hypothyroidism   . Schatzki's ring    last dilation 2010    Past Surgical History:  Procedure Laterality Date  . ABDOMINAL HYSTERECTOMY  1986   w/ unilateral salpingoopherectomy (can't remember which one)  . APPENDECTOMY    . CHOLECYSTECTOMY    . COLONOSCOPY  11/19/2004   Normal rectum/Normal colon  . COLONOSCOPY  12/11/09   Rourk-minimally friable anal canal/hemorrhoids otherwise normal  . COLONOSCOPY N/A 02/11/2015   Procedure: COLONOSCOPY;  Surgeon: Daneil Dolin, MD;  [facility];  Service: Endoscopy;  Laterality: N/A;  1100 - moved to 10:30 - Candy notified pt  . ESOPHAGOGASTRODUODENOSCOPY  08/15/2008   Moderate size hiatal hernia/Noncritical Schatzki's ring with superimposed distal esophageal erosion consistent with erosive reflux esophagitis  . ESOPHAGOGASTRODUODENOSCOPY N/A 06/11/2014   GYJ:[id number]'W ring dilated/HH    There were no vitals filed for this visit.      Subjective Assessment - 12/06/16 1042    Subjective  PT states she's walking in her shoe more than her boot at home.  Continues to use the CAM when she's outside.    Currently in Pain? No/denies                         [facility] Adult PT Treatment/Exercise - 12/06/16 0001      Ankle Exercises: Stretches   Plantar Fascia Stretch 3 reps;30 seconds   Slant Board Stretch 3 reps;30 seconds     Ankle Exercises: Standing   BAPS Standing;Level 2;15 reps   Vector Stance --   Vector Stance Limitations on foam Lt 5X5" holds   SLS on foam L: max of 5 12"   Heel Raises 20 reps  2 sets of 10 reps, single leg with eccentric lowering   Toe Raise 15 reps  on incline   Heel Walk (Round Trip) 1RT   Toe Walk (Round Trip) 1RT   Other Standing Ankle Exercises fwd 6"/lat 4" step ups x15 reps no UE; mini squats x10 reps                  PT Short Term Goals - 11/21/16 1220      PT SHORT TERM GOAL #1   Title Pt Lt dorsiflexion to be to 12 degrees to allow a normal heel toe gait    Status Achieved     PT SHORT TERM GOAL #2   Title Pt pain in her  left ankle to be no greater than a 2/10 to allow pt to walk for 30 minutes in comfort.   Baseline 11/10/16: Pt reports highest pain scale 2/10, able to walk with CAM boot for 20 minutes    Status Achieved     PT SHORT TERM GOAL #3   Title Pt to be able to single leg stance on both LE for at least 15 seconds to be confident walking up and down inclines.    Status Achieved           PT Long Term Goals - 11/21/16 1220      PT LONG TERM GOAL #1   Title Pt Lt ankle pain to be at a 0/10 to allow her to walk unlimited distances to return to her priot level of activity.   Time 4   Period Weeks   Status On-going     PT LONG TERM GOAL #2   Title Pt strength of her Lt ankle to be at 5/5 to allow her to go up and down steps in a reciprocal manner without discomfort    Time 4   Period Weeks   Status On-going     PT LONG TERM GOAL #3   Title Pt to be able to single leg stance on both LE  for at least 30 seconds to be able to be confident when walking on uneven terrain.   Time 4   Period Weeks   Status Partially Met  on 7/30 Rt is 28 seconds;Lt 60                Plan - 12/06/16 1542    Clinical Impression Statement Continued with strength and balance progression.  Added heel and toe walking as well as increasing reps/sets when able. Attempted increase to 6" step with lateral step ups, however too painful. Was able to increase to 6" with forward step ups.  Progressed SLS on foam and added vectors on foam.  Pt progressing well.     Rehab Potential Good   PT Frequency 2x / week   PT Duration 8 weeks  continue for 4 weeks   PT Treatment/Interventions ADLs/Self Care Home Management;Stair training;Gait training;Functional mobility training;Therapeutic activities;Therapeutic exercise;Patient/family education;Manual techniques   PT Next Visit Plan Continue to progress weightbearing strengthening exercises to pt's tolerance; add in forward step downs and reciprocal 4" steps next session.   PT Home Exercise Plan Sitting heel/toe raises; Long sitting AROM and t-band exercises for strengthening; seated gastroc stretch with towel   Consulted and Agree with Plan of Care Patient      Patient will benefit from skilled therapeutic intervention in order to improve the following deficits and impairments:  Abnormal gait, Decreased activity tolerance, Decreased balance, Decreased range of motion, Decreased strength, Increased edema, Pain  Visit Diagnosis: Difficulty in walking, not elsewhere classified  Pain in left ankle and joints of left foot     Problem List Patient Active Problem List   Diagnosis Date Noted  . Family hx of colon cancer   . Family history of colon cancer 12/22/2014  . Hiatal hernia   . Dysphagia 05/20/2014  . Schatzki's ring 05/20/2014  . Diverticulitis 12/21/2011  . Abdominal pain 12/21/2011  . Diarrhea 12/21/2011  . ARM PAIN, RIGHT 06/17/2010  .  MEDIAL EPICONDYLITIS 05/05/2010  . LATERAL EPICONDYLITIS 05/05/2010  . BLOOD IN STOOL 11/18/2009  . OBESITY, UNSPECIFIED 07/30/2008  . GERD 07/30/2008  . DYSPHAGIA PHARYNGEAL PHASE 07/30/2008  . HYPERTENSION, HX OF 07/30/2008  Teena Irani, PTA/CLT [phone]   Teena Irani 12/06/2016, 4:13 PM  [facility] [address] [place], [name], [address] Phone: [phone]   Fax:  [phone]  Name: Mackenzie Mcpherson MRN: [id number] Date of Birth: 07-30-1950

## 2016-12-08 ENCOUNTER — Ambulatory Visit (HOSPITAL_COMMUNITY): Payer: Federal, State, Local not specified - PPO

## 2016-12-08 ENCOUNTER — Encounter (HOSPITAL_COMMUNITY): Payer: Self-pay

## 2016-12-08 DIAGNOSIS — M25572 Pain in left ankle and joints of left foot: Secondary | ICD-10-CM

## 2016-12-08 DIAGNOSIS — R262 Difficulty in walking, not elsewhere classified: Secondary | ICD-10-CM

## 2016-12-08 DIAGNOSIS — M25672 Stiffness of left ankle, not elsewhere classified: Secondary | ICD-10-CM

## 2016-12-08 NOTE — Therapy (Signed)
[facility] [address] [place], [name], [address] Phone: [phone]   Fax:  [phone]  Physical Therapy Treatment  Patient Details  Name: Mackenzie Mcpherson MRN: [id number] Date of Birth: [birth year] Referring Provider: Arther Abbott   Encounter Date: 12/08/2016      PT End of Session - 12/08/16 1300    Visit Number 14   Number of Visits 16   Date for PT Re-Evaluation 11/26/16   Authorization Type BCBS   Authorization - Visit Number 14   Authorization - Number of Visits 16   PT Start Time 1300   PT Stop Time 1340   PT Time Calculation (min) 40 min   Activity Tolerance Patient tolerated treatment well;No increased pain   Behavior During Therapy WFL for tasks assessed/performed      Past Medical History:  Diagnosis Date  . Diverticulitis 2010  . GERD (gastroesophageal reflux disease)    errosive esophagitis in 2010  . Hemorrhoids   . Hiatal hernia   . Hypertension   . Hypothyroidism   . Schatzki's ring    last dilation 2010    Past Surgical History:  Procedure Laterality Date  . ABDOMINAL HYSTERECTOMY  1986   w/ unilateral salpingoopherectomy (can't remember which one)  . APPENDECTOMY    . CHOLECYSTECTOMY    . COLONOSCOPY  11/19/2004   Normal rectum/Normal colon  . COLONOSCOPY  12/11/09   Rourk-minimally friable anal canal/hemorrhoids otherwise normal  . COLONOSCOPY N/A 02/11/2015   Procedure: COLONOSCOPY;  Surgeon: Daneil Dolin, MD;  [facility];  Service: Endoscopy;  Laterality: N/A;  1100 - moved to 10:30 - Candy notified pt  . ESOPHAGOGASTRODUODENOSCOPY  08/15/2008   Moderate size hiatal hernia/Noncritical Schatzki's ring with superimposed distal esophageal erosion consistent with erosive reflux esophagitis  . ESOPHAGOGASTRODUODENOSCOPY N/A 06/11/2014   EHM:[id number]'E ring dilated/HH    There were no vitals filed for this visit.      Subjective Assessment - 12/08/16 1300    Subjective  Pt states she feels pretty good today. She's just having some muscle achiness/soreness in her L ankle; rates it 2/10 soreness but no pain.   Currently in Pain? No/denies             [facility] Adult PT Treatment/Exercise - 12/08/16 0001      Ankle Exercises: Stretches   Plantar Fascia Stretch 3 reps;30 seconds   Slant Board Stretch 3 reps;30 seconds     Ankle Exercises: Standing   BAPS Standing;Level 3;10 reps  DF, PF, inv, ev, CW, CCW   SLS LLE on foam 5x10"; on foam and palov press with RTB 2x20 reps LLE only   Rebounder L SLS 2x10 with green ball   Other Standing Ankle Exercises stair training on 4" steps x5RT; fwd step downs on 4" step x10 BUE, x10 one UE   Other Standing Ankle Exercises mini squats x15reps; wall squats x10 reps             PT Education - 12/08/16 1333    Education provided Yes   Education Details continue HEP, exercise technique   Person(s) Educated Patient   Methods Explanation;Demonstration   Comprehension Verbalized understanding;Returned demonstration          PT Short Term Goals - 11/21/16 1220      PT SHORT TERM GOAL #1   Title Pt Lt dorsiflexion to be to 12 degrees to allow a normal heel toe gait    Status Achieved  PT SHORT TERM GOAL #2   Title Pt pain in her left ankle to be no greater than a 2/10 to allow pt to walk for 30 minutes in comfort.   Baseline 11/10/16: Pt reports highest pain scale 2/10, able to walk with CAM boot for 20 minutes    Status Achieved     PT SHORT TERM GOAL #3   Title Pt to be able to single leg stance on both LE for at least 15 seconds to be confident walking up and down inclines.    Status Achieved           PT Long Term Goals - 11/21/16 1220      PT LONG TERM GOAL #1   Title Pt Lt ankle pain to be at a 0/10 to allow her to walk unlimited distances to return to her priot level of activity.   Time 4   Period Weeks   Status On-going     PT LONG TERM GOAL #2   Title Pt strength of her Lt ankle to  be at 5/5 to allow her to go up and down steps in a reciprocal manner without discomfort    Time 4   Period Weeks   Status On-going     PT LONG TERM GOAL #3   Title Pt to be able to single leg stance on both LE for at least 30 seconds to be able to be confident when walking on uneven terrain.   Time 4   Period Weeks   Status Partially Met  on 7/30 Rt is 28 seconds;Lt 60                Plan - 12/08/16 1339    Clinical Impression Statement Session continued to focus on improving CKC strengthening and ankle stability. She did well throughout entire session, only reporting minimal discomfort with fwd step downs. She did very well with stair training, only c/o some discomfort at the beginning but reported improved symptoms with repeated reps. Progressed her standing BAPS to L3; she did very well with this, not reporting any pain. Continue POC as planned.   Rehab Potential Good   PT Frequency 2x / week   PT Duration 8 weeks  continue for 4 weeks   PT Treatment/Interventions ADLs/Self Care Home Management;Stair training;Gait training;Functional mobility training;Therapeutic activities;Therapeutic exercise;Patient/family education;Manual techniques   PT Next Visit Plan Continue to progress weightbearing strengthening exercises to pt's tolerance; continue stair training, trial on 6" steps; continue to progress dynamic stability of L ankle   PT Home Exercise Plan Sitting heel/toe raises; Long sitting AROM and t-band exercises for strengthening; seated gastroc stretch with towel   Consulted and Agree with Plan of Care Patient      Patient will benefit from skilled therapeutic intervention in order to improve the following deficits and impairments:  Abnormal gait, Decreased activity tolerance, Decreased balance, Decreased range of motion, Decreased strength, Increased edema, Pain  Visit Diagnosis: Difficulty in walking, not elsewhere classified  Pain in left ankle and joints of left  foot  Stiffness of left ankle, not elsewhere classified     Problem List Patient Active Problem List   Diagnosis Date Noted  . Family hx of colon cancer   . Family history of colon cancer 12/22/2014  . Hiatal hernia   . Dysphagia 05/20/2014  . Schatzki's ring 05/20/2014  . Diverticulitis 12/21/2011  . Abdominal pain 12/21/2011  . Diarrhea 12/21/2011  . ARM PAIN, RIGHT 06/17/2010  . MEDIAL EPICONDYLITIS 05/05/2010  .  LATERAL EPICONDYLITIS 05/05/2010  . BLOOD IN STOOL 11/18/2009  . OBESITY, UNSPECIFIED 07/30/2008  . GERD 07/30/2008  . DYSPHAGIA PHARYNGEAL PHASE 07/30/2008  . HYPERTENSION, HX OF 07/30/2008     Geraldine Solar PT, DPT  [facility] [address] [place], [name], [address] Phone: [phone]   Fax:  [phone]  Name: JAZIYAH GRADEL MRN: [id number] Date of Birth: [birth year]

## 2016-12-13 ENCOUNTER — Ambulatory Visit (HOSPITAL_COMMUNITY): Payer: Federal, State, Local not specified - PPO | Admitting: Physical Therapy

## 2016-12-13 DIAGNOSIS — R262 Difficulty in walking, not elsewhere classified: Secondary | ICD-10-CM

## 2016-12-13 DIAGNOSIS — M25572 Pain in left ankle and joints of left foot: Secondary | ICD-10-CM

## 2016-12-13 DIAGNOSIS — M25672 Stiffness of left ankle, not elsewhere classified: Secondary | ICD-10-CM

## 2016-12-13 NOTE — Therapy (Signed)
[place], [name], [address] Phone: [phone]   Fax:  [phone]  Physical Therapy Treatment  Patient Details  Name: TALISHIA BETZLER MRN: [id number] Date of Birth: 1951/04/06 Referring Provider: Arther Abbott  Encounter Date: 12/13/2016      PT End of Session - 12/13/16 1029    Visit Number 15   Number of Visits 15   Date for PT Re-Evaluation 11/26/16   Authorization Type [employer]   Authorization - Visit Number 15   Authorization - Number of Visits 15   PT Start Time 3474   PT Stop Time 2595   PT Time Calculation (min) 38 min   Activity Tolerance Patient tolerated treatment well;No increased pain   Behavior During Therapy WFL for tasks assessed/performed      Past Medical History:  Diagnosis Date  . Diverticulitis 2010  . GERD (gastroesophageal reflux disease)    errosive esophagitis in 2010  . Hemorrhoids   . Hiatal hernia   . Hypertension   . Hypothyroidism   . Schatzki's ring    last dilation 2010    Past Surgical History:  Procedure Laterality Date  . ABDOMINAL HYSTERECTOMY  1986   w/ unilateral salpingoopherectomy (can't remember which one)  . APPENDECTOMY    . CHOLECYSTECTOMY    . COLONOSCOPY  11/19/2004   Normal rectum/Normal colon  . COLONOSCOPY  12/11/09   Rourk-minimally friable anal canal/hemorrhoids otherwise normal  . COLONOSCOPY N/A 02/11/2015   Procedure: COLONOSCOPY;  Surgeon: Daneil Dolin, MD;  Location: [facility];  Service: Endoscopy;  Laterality: N/A;  1100 - moved to 10:30 - Candy notified pt  . ESOPHAGOGASTRODUODENOSCOPY  08/15/2008   Moderate size hiatal hernia/Noncritical Schatzki's ring with superimposed distal esophageal erosion consistent with erosive reflux esophagitis  . ESOPHAGOGASTRODUODENOSCOPY N/A 06/11/2014   GLO:VFIEPPIR'J ring dilated/HH    There were no vitals filed for this visit.          [facility] PT Assessment - 12/13/16 0001      Assessment   Medical Diagnosis Lt Fx ankle with achilles tendonitis    Referring Provider Arther Abbott   Onset Date/Surgical Date 08/18/15   Next MD Visit 12/16/2016     Precautions   Precautions None   Required Braces or Orthoses --  cam boot      Restrictions   Weight Bearing Restrictions No     Balance Screen   Has the patient fallen in the past 6 months No   Has the patient had a decrease in activity level because of a fear of falling?  No   Is the patient reluctant to leave their home because of a fear of falling?  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    Leisure walk the Community education officer   Overall Cognitive Status Within Functional Limits for tasks assessed     Observation/Other Assessments   Focus on Therapeutic Outcomes (FOTO)  51     Observation/Other Assessments-Edema    Edema --  Rt 21.3; Lt  21 was 22.7      Functional Tests   Functional tests Single leg stance     Single Leg Stance   Comments Lt 28 seconds; RT 60  was 38 on 7/30      AROM   Left Ankle Dorsiflexion 11  was 5 on eval has been 11 since 7/19   Left Ankle Plantar Flexion 55  was 45 at eval; 50 on 7/30   Left Ankle Inversion 28  was 20 at eval; 22 on 7/30   Left Ankle Eversion 15  was 10     Strength   Right Ankle Dorsiflexion 5/5   Right Ankle Plantar Flexion 4+/5  was 3+ on 7/30    Right Ankle Inversion 5/5   Right Ankle Eversion 5/5  was 3+/5 at eval 4/5 on 7/30      Palpation   Palpation comment  decreased thickening of gastroc tendon.                      [facility] PT Treatment/Exercise - 12/13/16 0001      Manual Therapy   Manual Therapy Soft tissue mobilization   Manual therapy comments seperate from all other aspects of treatment    Soft tissue  mobilization to achilles tendon      Ankle Exercises: Plyometrics   Plyometric Exercises steps x 4 round trip                 PT Education - 12/13/16 1028    Education provided Yes   Education Details how to wean away from the CAM boot when MD states that it is alright; how to give self massages.   Person(s) Educated Patient   Methods Explanation   Comprehension Verbalized understanding;Returned demonstration          PT Short Term Goals - 12/13/16 1013      PT SHORT TERM GOAL #1   Title Pt Lt dorsiflexion to be to 12 degrees to allow a normal heel toe gait    Status Achieved     PT SHORT TERM GOAL #2   Title Pt pain in her left ankle to be no greater than a 2/10 to allow pt to walk for 30 minutes in comfort.   Baseline 11/10/16: Pt reports highest pain scale 2/10, able to walk with CAM boot for 20 minutes    Status Achieved     PT SHORT TERM GOAL #3   Title Pt to be able to single leg stance on both LE for at least 15 seconds to be confident walking up and down inclines.    Status Achieved           PT Long Term Goals - 12/13/16 1014      PT LONG TERM GOAL #1   Title Pt Lt ankle pain to be at a 0/10 to allow her to walk unlimited distances to return to her priot level of activity.   Time 4   Period Weeks   Status Achieved     PT LONG TERM GOAL #2   Title Pt strength of her Lt ankle to be at 5/5 to allow her to go up and down steps in a reciprocal manner without discomfort    Time 4   Period Weeks   Status Achieved     PT LONG TERM GOAL #3   Title Pt to be able to single leg stance on both LE for at least 30 seconds to be able to be confident when walking on uneven terrain.   Time 4   Period Weeks   Status Partially Met  varies on the day  Plan - 12/13/16 1030    Clinical Impression Statement Pt reassessed today with minimal limitations.  Pt is no longer in need of skilled physical therapy    Rehab Potential Good   PT  Frequency 2x / week   PT Duration 8 weeks  continue for 4 weeks   PT Treatment/Interventions ADLs/Self Care Home Management;Stair training;Gait training;Functional mobility training;Therapeutic activities;Therapeutic exercise;Patient/family education;Manual techniques   PT Next Visit Plan Discharge to home program.   PT Home Exercise Plan Sitting heel/toe raises; Long sitting AROM and t-band exercises for strengthening; seated gastroc stretch with towel   Consulted and Agree with Plan of Care Patient      Patient will benefit from skilled therapeutic intervention in order to improve the following deficits and impairments:  Abnormal gait, Decreased activity tolerance, Decreased balance, Decreased range of motion, Decreased strength, Increased edema, Pain  Visit Diagnosis: Difficulty in walking, not elsewhere classified  Pain in left ankle and joints of left foot  Stiffness of left ankle, not elsewhere classified     Problem List Patient Active Problem List   Diagnosis Date Noted  . Family hx of colon cancer   . Family history of colon cancer 12/22/2014  . Hiatal hernia   . Dysphagia 05/20/2014  . Schatzki's ring 05/20/2014  . Diverticulitis 12/21/2011  . Abdominal pain 12/21/2011  . Diarrhea 12/21/2011  . ARM PAIN, RIGHT 06/17/2010  . MEDIAL EPICONDYLITIS 05/05/2010  . LATERAL EPICONDYLITIS 05/05/2010  . BLOOD IN STOOL 11/18/2009  . OBESITY, UNSPECIFIED 07/30/2008  . GERD 07/30/2008  . DYSPHAGIA PHARYNGEAL PHASE 07/30/2008  . HYPERTENSION, HX OF 07/30/2008    Rayetta Humphrey, PT CLT [phone] 12/13/2016, 10:31 AM  [place] [address] [place], [name], [address] Phone: [phone]   Fax:  [phone]  Name: TREMEKA HELBLING MRN: [id number] Date of Birth: 1951-03-31    PHYSICAL THERAPY DISCHARGE SUMMARY  Visits from Start of Care: 15  Current functional level related to goals / functional outcomes: See  above   Remaining deficits: See above   Education / Equipment: HEP Plan: Patient agrees to discharge.  Patient goals were met. Patient is being discharged due to meeting the stated rehab goals.  ?????       Rayetta Humphrey, [place] CLT [phone]

## 2016-12-15 ENCOUNTER — Ambulatory Visit (HOSPITAL_COMMUNITY): Payer: Federal, State, Local not specified - PPO | Admitting: Physical Therapy

## 2016-12-19 ENCOUNTER — Ambulatory Visit (INDEPENDENT_AMBULATORY_CARE_PROVIDER_SITE_OTHER): Payer: Federal, State, Local not specified - PPO | Admitting: Orthopedic Surgery

## 2016-12-19 DIAGNOSIS — S86012D Strain of left Achilles tendon, subsequent encounter: Secondary | ICD-10-CM | POA: Diagnosis not present

## 2016-12-19 NOTE — Progress Notes (Signed)
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 She actually was sent to the hospital for x-rays and the x-ray showed a lateral malleolus fracture.   She initially had pain in this area but localized to the back of her Achilles. She appears to have a chronic nodule at the Achilles insertion but no paresis complaints of pain there.   The patient has been in the boot since May. She has been going to physical therapy last visit was 12/13/2016   PT NOTES  Pt reassessed today with minimal limitations.  Pt is no longer in need of skilled physical therapy    Rehab Potential Good   PT Frequency 2x / week   PT Duration 8 weeks  continue for 4 weeks   PT Treatment/Interventions ADLs/Self Care Home Management;Stair training;Gait training;Functional mobility training;Therapeutic activities;Therapeutic exercise;Patient/family education;Manual techniques   PT Next Visit Plan Discharge to home program.   PT Home Exercise Plan Sitting heel/toe raises; Long sitting AROM and t-band exercises for strengthening; seated gastroc stretch with towel   Consulted and Agree with Plan of Care Patient Pt reassessed today with minimal limitations.  Pt is no longer in need of skilled physical therapy    Rehab Potential Good   PT Frequency 2x / week   PT Duration 8 weeks  continue for 4 weeks   PT Treatment/Interventions ADLs/Self Care Home Management;Stair training;Gait training;Functional mobility training;Therapeutic activities;Therapeutic exercise;Patient/family education;Manual techniques   PT Next Visit Plan Discharge to home program.   PT Home Exercise Plan Sitting heel/toe raises; Long sitting AROM and t-band exercises for strengthening; seated gastroc stretch with towel   Consulted and Agree with Plan of Care Patient  Today she has  slight discomfort slight limp when out of her Cam Walker  System review negative for numbness tingling in the foot  Appearance is normal gait is supported by walking brace  She has mild tenderness in the Achilles tendon no tenderness over the pump bump full range of motion stable anterior drawer normal plantarflexion strength neurovascular exam intact  Encounter Diagnosis  Name Primary?  . Strain of left Achilles tendon, subsequent encounter Yes    Start weaning process 1 hour less per day in the [employer] Continue home exercises Use a 1 or 2 inch heel for shoe wear Return 1 month

## 2016-12-19 NOTE — Patient Instructions (Signed)
Wear the walking brace one hour less per day  Wear a one-inch or 2 inch heel for shoe wear  Home exercise

## 2017-01-18 ENCOUNTER — Encounter: Payer: Self-pay | Admitting: Orthopedic Surgery

## 2017-01-18 ENCOUNTER — Ambulatory Visit (INDEPENDENT_AMBULATORY_CARE_PROVIDER_SITE_OTHER): Payer: Federal, State, Local not specified - PPO | Admitting: Orthopedic Surgery

## 2017-01-18 VITALS — BP 141/77 | HR 81 | Ht 60.0 in | Wt 161.0 lb

## 2017-01-18 DIAGNOSIS — S86012D Strain of left Achilles tendon, subsequent encounter: Secondary | ICD-10-CM

## 2017-01-18 NOTE — Progress Notes (Signed)
This follow-up visit  Chief Complaint  Patient presents with  . Follow-up    Left achilles strain    66 year old female with history of Achilles tendinosis status post physical therapy Cam Walker currently doing well with intermittent discomfort  She has nodularity of the tendon in the watershed region she has a pump bump as well. She has some pain with extension and flexion of the ankle but her ankle is stable and her function of her Achilles tendon is normal  She will continue with exercises and will come back if things get worse  Encounter Diagnosis  Name Primary?  . Strain of left Achilles tendon, subsequent encounter Yes

## 2017-04-05 ENCOUNTER — Other Ambulatory Visit (HOSPITAL_COMMUNITY): Payer: Self-pay | Admitting: Pulmonary Disease

## 2017-04-05 DIAGNOSIS — Z1231 Encounter for screening mammogram for malignant neoplasm of breast: Secondary | ICD-10-CM

## 2017-04-26 ENCOUNTER — Telehealth: Payer: Self-pay | Admitting: Orthopedic Surgery

## 2017-04-26 NOTE — Telephone Encounter (Signed)
She was last here in sept for Achilles, wants to know if you will order MRI scan, or if you need to see her first.

## 2017-04-26 NOTE — Telephone Encounter (Signed)
Mackenzie Mcpherson called and stated that she would like to get MRI of her foot/ankle/achilles.She was last seen here on 01-18-17 and was told to continue with home exercises and come back if things got worse.  Since that time, she has seen Dr. Lovena Le for massage and heat therapy but no one else.    I told her that I would ask if she needs to bring by Dr. Tanna Furry notes for Dr. Aline Brochure to review but that I felt sure she would need an appointment to see Dr. Aline Brochure before an MRI could be scheduled for her.  Please advise if you think Dr. Tanna Furry notes are needed to be reviewed before giving an appointment here?

## 2017-04-26 NOTE — Telephone Encounter (Signed)
Need to see per insurance

## 2017-04-27 NOTE — Telephone Encounter (Signed)
We need to see her, per Dr Aline Brochure

## 2017-05-01 ENCOUNTER — Ambulatory Visit (HOSPITAL_COMMUNITY)
Admission: RE | Admit: 2017-05-01 | Discharge: 2017-05-01 | Disposition: A | Discharge status: Home / Self Care | Payer: Federal, State, Local not specified - PPO | Source: Ambulatory Visit | Attending: Pulmonary Disease | Admitting: Pulmonary Disease

## 2017-05-01 DIAGNOSIS — Z1231 Encounter for screening mammogram for malignant neoplasm of breast: Secondary | ICD-10-CM | POA: Insufficient documentation

## 2017-05-19 ENCOUNTER — Encounter: Payer: Self-pay | Admitting: Orthopedic Surgery

## 2017-05-19 ENCOUNTER — Ambulatory Visit: Payer: Federal, State, Local not specified - PPO | Admitting: Orthopedic Surgery

## 2017-05-19 VITALS — BP 126/83 | HR 80 | Ht 60.0 in | Wt 161.0 lb

## 2017-05-19 DIAGNOSIS — S86012D Strain of left Achilles tendon, subsequent encounter: Secondary | ICD-10-CM | POA: Diagnosis not present

## 2017-05-19 DIAGNOSIS — M7662 Achilles tendinitis, left leg: Secondary | ICD-10-CM

## 2017-05-19 DIAGNOSIS — M6788 Other specified disorders of synovium and tendon, other site: Secondary | ICD-10-CM

## 2017-05-19 NOTE — Progress Notes (Signed)
OFFICE VISIT  Chief Complaint  Patient presents with  . Follow-up    Recheck on left achilles.    67 year old female who has been followed for Achilles tendinosis and tendinitis for 7 months with varying courses of physical therapy anti-inflammatory medication and CAM Walker and shoe wear modification including heel elevation, presents back with increasing pain swelling and gait disturbance  Review of Systems  Constitutional: Negative for chills, fever and weight loss.  Respiratory: Negative for shortness of breath.   Cardiovascular: Negative for chest pain.  Neurological: Negative for tingling.    Current Outpatient Medications:  .  Cholecalciferol (VITAMIN D3) 5000 UNITS TABS, Take 1 tablet by mouth daily., Disp: , Rfl:  .  levothyroxine (SYNTHROID, LEVOTHROID) 50 MCG tablet, Take 50 mcg by mouth daily.  , Disp: , Rfl:  .  losartan-hydrochlorothiazide (HYZAAR) 100-12.5 MG per tablet, Take 1 tablet by mouth daily.  , Disp: , Rfl:  .  Multiple Vitamins-Minerals (WOMENS MULTIVITAMIN PLUS) TABS, Take 1 tablet by mouth daily., Disp: , Rfl:  .  omeprazole (PRILOSEC) 40 MG capsule, TAKE 1 CAPSULE BY MOUTH EVERY DAY, Disp: 90 capsule, Rfl: 3 .  Probiotic Product (ALIGN) 4 MG CAPS, Take 4 mg by mouth daily., Disp: , Rfl:   BP 126/83   Pulse 80   Ht 5' (1.524 m)   Wt 161 lb (73 kg)   BMI 31.44 kg/m  Physical Exam  Constitutional: She is oriented to person, place, and time. She appears well-developed and well-nourished.  Musculoskeletal:       Feet:  Neurological: She is alert and oriented to person, place, and time. Gait abnormal.  Limping favoring the left lower extremity  Psychiatric: She has a normal mood and affect. Judgment normal.  Vitals reviewed.  Encounter Diagnoses  Name Primary?  . Strain of left Achilles tendon, subsequent encounter   . Achilles tendinosis of left lower extremity Yes    Plan  The patient has had 6-7 months of nonoperative care including a recent  there is a therapy session for several weeks with increasing pain tenderness swelling and inability to walk  It is time to get an MRI to assess how much of this tendon is still viable and whether or not she needs surgical debridement and tendon transfer

## 2017-05-19 NOTE — Patient Instructions (Signed)
Resume Cam walker continue exercises

## 2017-05-25 ENCOUNTER — Ambulatory Visit (HOSPITAL_COMMUNITY)
Admission: RE | Admit: 2017-05-25 | Discharge: 2017-05-25 | Disposition: A | Discharge status: Home / Self Care | Payer: Federal, State, Local not specified - PPO | Source: Ambulatory Visit | Attending: Orthopedic Surgery | Admitting: Orthopedic Surgery

## 2017-05-25 DIAGNOSIS — R9389 Abnormal findings on diagnostic imaging of other specified body structures: Secondary | ICD-10-CM | POA: Diagnosis not present

## 2017-05-25 DIAGNOSIS — S86012D Strain of left Achilles tendon, subsequent encounter: Secondary | ICD-10-CM

## 2017-05-25 DIAGNOSIS — M722 Plantar fascial fibromatosis: Secondary | ICD-10-CM | POA: Diagnosis not present

## 2017-05-29 ENCOUNTER — Ambulatory Visit: Payer: Federal, State, Local not specified - PPO | Admitting: Orthopedic Surgery

## 2017-05-29 ENCOUNTER — Encounter: Payer: Self-pay | Admitting: Orthopedic Surgery

## 2017-05-29 VITALS — BP 127/66 | HR 74 | Ht 60.0 in | Wt 161.0 lb

## 2017-05-29 DIAGNOSIS — M6788 Other specified disorders of synovium and tendon, other site: Secondary | ICD-10-CM

## 2017-05-29 DIAGNOSIS — M7662 Achilles tendinitis, left leg: Secondary | ICD-10-CM | POA: Diagnosis not present

## 2017-05-29 NOTE — Progress Notes (Signed)
Follow-up appointment  Chief Complaint  Patient presents with  . Tendonitis    left / feels better in the boot     Patient has Achilles tendinosis MRI shows no acute tear but chronic degenerative changes in the watershed area  Patient feels better in the boot we will continue for 6 weeks and come back for reexamination we discussed treatment options

## 2017-06-06 LAB — HEMOGLOBIN A1C: Hemoglobin A1C: 6.3

## 2017-07-10 ENCOUNTER — Ambulatory Visit (INDEPENDENT_AMBULATORY_CARE_PROVIDER_SITE_OTHER): Payer: Federal, State, Local not specified - PPO | Admitting: Orthopedic Surgery

## 2017-07-10 ENCOUNTER — Encounter: Payer: Self-pay | Admitting: Orthopedic Surgery

## 2017-07-10 VITALS — BP 150/92 | HR 69 | Ht 60.0 in | Wt 160.0 lb

## 2017-07-10 DIAGNOSIS — M7662 Achilles tendinitis, left leg: Secondary | ICD-10-CM

## 2017-07-10 DIAGNOSIS — M6788 Other specified disorders of synovium and tendon, other site: Secondary | ICD-10-CM

## 2017-07-10 NOTE — Progress Notes (Signed)
Progress Note   Patient ID: Mackenzie Mcpherson, female   DOB: 02-26-1951, 67 y.o.   MRN: [id number]  Chief Complaint  Patient presents with  . Ankle Pain    left ankle / achilles     67 year old female followed for Achilles tendinosis previous physical therapy improved.  She went back to CAM Walker for increased pain has improved again.       Review of Systems  Skin: Negative.   Neurological: Negative.    Current Meds  Medication Sig  . Cholecalciferol (VITAMIN D3) 5000 UNITS TABS Take 1 tablet by mouth daily.  Marland Kitchen levothyroxine (SYNTHROID, LEVOTHROID) 50 MCG tablet Take 50 mcg by mouth daily.    Marland Kitchen losartan-hydrochlorothiazide (HYZAAR) 100-12.5 MG per tablet Take 1 tablet by mouth daily.    . Multiple Vitamins-Minerals (WOMENS MULTIVITAMIN PLUS) TABS Take 1 tablet by mouth daily.  Marland Kitchen omeprazole (PRILOSEC) 40 MG capsule TAKE 1 CAPSULE BY MOUTH EVERY DAY  . Probiotic Product (ALIGN) 4 MG CAPS Take 4 mg by mouth daily.    Allergies  Allergen Reactions  . Pantoprazole Sodium Anaphylaxis and Hives     BP (!) 150/92   Pulse 69   Ht 5' (1.524 m)   Wt 160 lb (72.6 kg)   BMI 31.25 kg/m   Physical Exam  Constitutional: She is oriented to person, place, and time. She appears well-developed and well-nourished.  Musculoskeletal:       Feet:  Neurological: She is alert and oriented to person, place, and time.  Psychiatric: She has a normal mood and affect. Judgment normal.  Vitals reviewed.    Medical decision-making Encounter Diagnosis  Name Primary?  . Achilles tendinosis of left lower extremity Yes    Recommend the patient use elevated heel lift in flat shoes and then as long as the shoe is healed it is fine for her to walk in a regular shoe  Follow-up 6 weeks     Arther Abbott, MD 07/10/2017 10:17 AM

## 2017-08-23 ENCOUNTER — Encounter: Payer: Self-pay | Admitting: Orthopedic Surgery

## 2017-08-23 ENCOUNTER — Ambulatory Visit: Payer: Federal, State, Local not specified - PPO | Admitting: Orthopedic Surgery

## 2017-08-23 VITALS — BP 117/71 | HR 79 | Ht 60.0 in | Wt 158.0 lb

## 2017-08-23 DIAGNOSIS — M6788 Other specified disorders of synovium and tendon, other site: Secondary | ICD-10-CM

## 2017-08-23 DIAGNOSIS — M7662 Achilles tendinitis, left leg: Secondary | ICD-10-CM | POA: Diagnosis not present

## 2017-08-23 NOTE — Progress Notes (Signed)
Progress Note   Patient ID: Mackenzie Mcpherson, female   DOB: 1950-07-07, 67 y.o.   MRN: [id number]  Chief Complaint  Patient presents with  . Foot Problem    left achilles tndinosis / patient states she has improved since last visit      Medical decision-making Encounter Diagnosis  Name Primary?  . Achilles tendinosis of left lower extremity Yes      No orders of the defined types were placed in this encounter.    PLAN: Symptomatic treatment at this point she can go into the Cam walker when she is having a lot of pain she is using ice and heat appropriately but since she is feeling better we will use an elevated heel shoe follow-up in 3 months unless things get worse    Chief Complaint  Patient presents with  . Foot Problem    left achilles tndinosis / patient states she has improved since last visit     HPI 68 year old female with chronic tendinosis she has had physical therapy and intermittent CAM Walker where today she is doing very well  ROS Current Meds  Medication Sig  . Cholecalciferol (VITAMIN D3) 5000 UNITS TABS Take 1 tablet by mouth daily.  Marland Kitchen levothyroxine (SYNTHROID, LEVOTHROID) 50 MCG tablet Take 50 mcg by mouth daily.    Marland Kitchen losartan-hydrochlorothiazide (HYZAAR) 100-12.5 MG per tablet Take 1 tablet by mouth daily.    . Multiple Vitamins-Minerals (WOMENS MULTIVITAMIN PLUS) TABS Take 1 tablet by mouth daily.  Marland Kitchen omeprazole (PRILOSEC) 40 MG capsule TAKE 1 CAPSULE BY MOUTH EVERY DAY  . Probiotic Product (ALIGN) 4 MG CAPS Take 4 mg by mouth daily.    Allergies  Allergen Reactions  . Pantoprazole Sodium Anaphylaxis and Hives     BP 117/71   Pulse 79   Ht 5' (1.524 m)   Wt 158 lb (71.7 kg)   BMI 30.86 kg/m   Physical Exam  Fusiform deformity of the Achilles tendon as well as a pump bump on the Achilles but normal range of motion and no tenderness gait is normal and an elevated heel shoe   Amy Littrell, RT 08/23/2017 8:43 AM

## 2017-12-01 ENCOUNTER — Ambulatory Visit (HOSPITAL_COMMUNITY)
Admission: RE | Admit: 2017-12-01 | Discharge: 2017-12-01 | Disposition: A | Discharge status: Home / Self Care | Payer: Federal, State, Local not specified - PPO | Source: Ambulatory Visit | Attending: Pulmonary Disease | Admitting: Pulmonary Disease

## 2017-12-01 ENCOUNTER — Other Ambulatory Visit (HOSPITAL_COMMUNITY): Payer: Self-pay | Admitting: Pulmonary Disease

## 2017-12-01 DIAGNOSIS — M79644 Pain in right finger(s): Principal | ICD-10-CM

## 2017-12-01 DIAGNOSIS — G8929 Other chronic pain: Secondary | ICD-10-CM

## 2017-12-01 DIAGNOSIS — M19041 Primary osteoarthritis, right hand: Secondary | ICD-10-CM | POA: Insufficient documentation

## 2017-12-06 ENCOUNTER — Other Ambulatory Visit: Payer: Self-pay | Admitting: Gastroenterology

## 2017-12-06 ENCOUNTER — Ambulatory Visit: Payer: Federal, State, Local not specified - PPO | Admitting: Orthopedic Surgery

## 2017-12-07 NOTE — Telephone Encounter (Signed)
Please tell the patient I can send in a limited refill for a couple months.  However, she has not been seen by our office in 3 years.  She will need an office visit to follow-up for further refills.

## 2017-12-07 NOTE — Telephone Encounter (Signed)
Lmom, waiting on a return call.  

## 2017-12-08 ENCOUNTER — Telehealth: Payer: Self-pay | Admitting: Internal Medicine

## 2017-12-08 NOTE — Telephone Encounter (Signed)
PATIENT RETURNED CALL, PLEASE CALL BACK  °

## 2017-12-08 NOTE — Telephone Encounter (Signed)
Spoke with pt and is scheduled for 03/15/18.

## 2017-12-13 ENCOUNTER — Encounter: Payer: Self-pay | Admitting: Orthopedic Surgery

## 2017-12-13 ENCOUNTER — Ambulatory Visit: Payer: Federal, State, Local not specified - PPO | Admitting: Orthopedic Surgery

## 2017-12-13 VITALS — BP 155/79 | HR 72 | Ht 60.0 in | Wt 159.0 lb

## 2017-12-13 DIAGNOSIS — M7662 Achilles tendinitis, left leg: Secondary | ICD-10-CM

## 2017-12-13 DIAGNOSIS — M65311 Trigger thumb, right thumb: Secondary | ICD-10-CM

## 2017-12-13 DIAGNOSIS — M6788 Other specified disorders of synovium and tendon, other site: Secondary | ICD-10-CM

## 2017-12-13 NOTE — Progress Notes (Signed)
Progress Note   Patient ID: Mackenzie Mcpherson, female   DOB: 02-Feb-1951, 67 y.o.   MRN: [id number]   Chief Complaint  Patient presents with  . Foot Pain    left/ feels better     HPI 67 year old female comes in for follow-up chronic left Achilles tendinosis.  She says she feels better she is able to walk 3 blocks she is wearing elevated shoe and doing her stretches  Right thumb pain pain located over the A1 pulley right thumb quality dull severity moderate duration 4 weeks timing worse with flexing and extending the thumb associated with locking  ROS  Patient complains of pain and catching locking right thumb. Denies numbness or tingling in the hand   Allergies  Allergen Reactions  . Pantoprazole Sodium Anaphylaxis and Hives     BP (!) 155/79   Pulse 72   Ht 5' (1.524 m)   Wt 159 lb (72.1 kg)   BMI 31.05 kg/m   Physical Exam  Constitutional: She is oriented to person, place, and time. She appears well-developed and well-nourished.  Neurological: She is alert and oriented to person, place, and time.  Psychiatric: She has a normal mood and affect. Judgment normal.  Vitals reviewed.  Left Achilles small to moderate-sized pump bump tender nodular irregularity in the Achilles decreased dorsiflexion normal plantarflexion ankle stable plantarflexion strength normal skin intact pulses good sensation normal  Right thumb tenderness over the A1 pulley catching and locking full range of motion normal skin flexor tendon strength normal Sensation normal alignment and joint stability normal color capillary refill normal   Medical decisions:   Data  I Encounter Diagnoses  Name Primary?  . Achilles tendinosis of left lower extremity Yes  . Trigger thumb of right hand     PLAN:   Inject right trigger thumb  Continue stretching and heel elevated shoes for the Achilles tendon  Follow-up as needed   Right Trigger thumb injection Medication  1 mL of 40 mg Depo-Medrol  2 mL of 1% lidocaine plain  Ethyl chloride for anesthesia  Verbal consent was obtained timeout was taken to confirm the injection site as right thumb  Alcohol was used to prepare the skin along with ethyl chloride and then the injection was made at the A1 pulley there were no complications  Arther Abbott, MD 12/13/2017 10:32 AM

## 2018-01-05 ENCOUNTER — Other Ambulatory Visit (HOSPITAL_COMMUNITY): Payer: Self-pay | Admitting: Pulmonary Disease

## 2018-01-05 DIAGNOSIS — N200 Calculus of kidney: Secondary | ICD-10-CM

## 2018-01-05 DIAGNOSIS — Z87442 Personal history of urinary calculi: Secondary | ICD-10-CM

## 2018-01-09 ENCOUNTER — Ambulatory Visit (HOSPITAL_COMMUNITY)
Admission: RE | Admit: 2018-01-09 | Discharge: 2018-01-09 | Disposition: A | Discharge status: Home / Self Care | Payer: Federal, State, Local not specified - PPO | Source: Ambulatory Visit | Attending: Pulmonary Disease | Admitting: Pulmonary Disease

## 2018-01-09 DIAGNOSIS — I708 Atherosclerosis of other arteries: Secondary | ICD-10-CM | POA: Insufficient documentation

## 2018-01-09 DIAGNOSIS — N200 Calculus of kidney: Secondary | ICD-10-CM | POA: Diagnosis not present

## 2018-03-02 ENCOUNTER — Other Ambulatory Visit: Payer: Self-pay | Admitting: Nurse Practitioner

## 2018-03-15 ENCOUNTER — Encounter: Payer: Self-pay | Admitting: Nurse Practitioner

## 2018-03-15 ENCOUNTER — Ambulatory Visit: Payer: Federal, State, Local not specified - PPO | Admitting: Nurse Practitioner

## 2018-03-15 VITALS — BP 145/86 | HR 86 | Temp 97.2°F | Ht 60.0 in | Wt 158.6 lb

## 2018-03-15 DIAGNOSIS — R143 Flatulence: Secondary | ICD-10-CM | POA: Insufficient documentation

## 2018-03-15 DIAGNOSIS — R1013 Epigastric pain: Secondary | ICD-10-CM

## 2018-03-15 DIAGNOSIS — K2100 Gastro-esophageal reflux disease with esophagitis, without bleeding: Secondary | ICD-10-CM

## 2018-03-15 MED ORDER — OMEPRAZOLE 40 MG PO CPDR: 40.0000 mg | DELAYED_RELEASE_CAPSULE | Freq: Every day | ORAL | 3 refills | Status: DC

## 2018-03-15 NOTE — Assessment & Plan Note (Signed)
The patient admits intermittent bouts of excessive gas which she attributes to diet triggers.  When this happens she takes probiotic and typically helps.  She can also take simethicone as needed.  Return for follow-up in 2 years.  Call if any problems before then.

## 2018-03-15 NOTE — Assessment & Plan Note (Signed)
Noted mild epigastric pain likely due to persistent GERD.  We will refill her PPI at this time, follow-up in 2 years.  Call if any worsening symptoms or problems.

## 2018-03-15 NOTE — Patient Instructions (Signed)
1. I sent a refill of your medication to the pharmacy. 2. Continue your current medications. 3. Return for follow-up in 2 years. 4. Call us if you have any questions or concerns.  At [facility] we value your feedback. You may receive a survey about your visit today. Please share your experience as we strive to create trusting relationships with our patients to provide genuine, compassionate, quality care.  We appreciate your understanding and patience as we review any laboratory studies, imaging, and other diagnostic tests that are ordered as we care for you. Our office policy is 5 business days for review of these results, and any emergent or urgent results are addressed in a timely manner for your best interest. If you do not hear from our office in 1 week, please contact us.   We also encourage the use of MyChart, which contains your medical information for your review as well. If you are not enrolled in this feature, an access code is on this after visit summary for your convenience. Thank you for allowing [place] to be involved in your care.  It was great to see you today!  I hope you have a Happy Thanksgiving!!

## 2018-03-15 NOTE — Assessment & Plan Note (Signed)
GERD symptoms currently well controlled on PPI.  We have not seen her in over 2 years.  Not we have seen her we can continue to refill her PPI.  Recommend she continue to take this daily, continue other medications, follow-up in 2 years.

## 2018-03-15 NOTE — Progress Notes (Signed)
Referring Provider: Sinda Du, MD Primary Care Physician:  Sinda Du, MD Primary GI:  Dr. Gala Romney  Chief Complaint  Patient presents with  . Gas    HPI:   Mackenzie Mcpherson is a 67 y.o. female who presents for medication refill.  Patient has not been seen by our office since 02/02/2015 when she was seen for family history of colon cancer.  Patient does have a history of GERD but symptoms are well managed on Prilosec daily.  Last colonoscopy completed 02/11/2015 which found normal colonoscopy and recommended repeat exam in 5 years (2021).  Today she states she's doing ok overall. She states her GERD medication is due for refill. She is on Prilosec 40 mg daily which generally works well. Occasionally she has gas which is diet related. When she has excess gas she takes a probiotic which helps. Is on keto diet as well. Has a kidney stone which was non-obstructing. She drank a beer and hasn't had a problem since; back pains resolved. She is on a modified Keto Diet (eats a lot of vegetables and protein, avoids the high fat and limits starches); drinks a lot of water. Denies abdominal pain, N/V, hematochezia, melena, fever, chills, unintentional weight loss.  Past Medical History:  Diagnosis Date  . Diverticulitis 2010  . GERD (gastroesophageal reflux disease)    errosive esophagitis in 2010  . Hemorrhoids   . Hiatal hernia   . Hypertension   . Hypothyroidism   . Schatzki's ring    last dilation 2010    Past Surgical History:  Procedure Laterality Date  . ABDOMINAL HYSTERECTOMY  1986   w/ unilateral salpingoopherectomy (can't remember which one)  . APPENDECTOMY    . CHOLECYSTECTOMY    . COLONOSCOPY  11/19/2004   Normal rectum/Normal colon  . COLONOSCOPY  12/11/09   Rourk-minimally friable anal canal/hemorrhoids otherwise normal  . COLONOSCOPY N/A 02/11/2015   Procedure: COLONOSCOPY;  Surgeon: Daneil Dolin, MD;  Location: [facility];  Service: Endoscopy;   Laterality: N/A;  1100 - moved to 10:30 - Candy notified pt  . ESOPHAGOGASTRODUODENOSCOPY  08/15/2008   Moderate size hiatal hernia/Noncritical Schatzki's ring with superimposed distal esophageal erosion consistent with erosive reflux esophagitis  . ESOPHAGOGASTRODUODENOSCOPY N/A 06/11/2014   ZOX:[id number] ring dilated/HH    Current Outpatient Medications  Medication Sig Dispense Refill  . Cholecalciferol (VITAMIN D3) 5000 UNITS TABS Take 1 tablet by mouth daily.    . Ginger, Zingiber officinalis, (GINGER PO) Take by mouth daily.     Marland Kitchen levothyroxine (SYNTHROID, LEVOTHROID) 50 MCG tablet Take 50 mcg by mouth daily.      Marland Kitchen losartan-hydrochlorothiazide (HYZAAR) 100-12.5 MG per tablet Take 1 tablet by mouth daily.      . Multiple Vitamins-Minerals (WOMENS MULTIVITAMIN PLUS) TABS Take 1 tablet by mouth daily.    Marland Kitchen omeprazole (PRILOSEC) 40 MG capsule TAKE 1 CAPSULE BY MOUTH EVERY DAY 90 capsule 1  . Probiotic Product (ALIGN) 4 MG CAPS Take 4 mg by mouth daily.    . TURMERIC PO Take by mouth daily.      No current facility-administered medications for this visit.     Allergies as of 03/15/2018 - Review Complete 03/15/2018  Allergen Reaction Noted  . Pantoprazole sodium Anaphylaxis and Hives     Family History  Problem Relation Age of Onset  . Hypertension Father   . Stroke Mother   . Diabetes Mother   . Colon cancer Brother 85  . Colon cancer Brother 28  .  Prostate cancer Brother     Social History   Socioeconomic History  . Marital status: Married    Spouse name: Not on file  . Number of children: 0  . Years of education: Not on file  . Highest education level: Not on file  Occupational History  . Occupation: owns Dance movement psychotherapist, retired [employer]    Employer: RETIRED  Social Needs  . Financial resource strain: Not on file  . Food insecurity:    Worry: Not on file    Inability: Not on file  . Transportation needs:    Medical: Not on file    Non-medical: Not  on file  Tobacco Use  . Smoking status: Never Smoker  . Smokeless tobacco: Never Used  Substance and Sexual Activity  . Alcohol use: No    Alcohol/week: 0.0 standard drinks  . Drug use: No  . Sexual activity: Not on file  Lifestyle  . Physical activity:    Days per week: Not on file    Minutes per session: Not on file  . Stress: Not on file  Relationships  . Social connections:    Talks on phone: Not on file    Gets together: Not on file    Attends religious service: Not on file    Active member of club or organization: Not on file    Attends meetings of clubs or organizations: Not on file    Relationship status: Not on file  Other Topics Concern  . Not on file  Social History Narrative  . Not on file    Review of Systems: General: Negative for anorexia, weight loss, fever, chills, fatigue, weakness. ENT: Negative for hoarseness, difficulty swallowing. CV: Negative for chest pain, angina, palpitations, peripheral edema.  Respiratory: Negative for dyspnea at rest, cough, sputum, wheezing.  GI: See history of present illness. MS: Negative for joint pain, low back pain.  Derm: Negative for rash or itching.  Endo: Negative for unusual weight change.  Heme: Negative for bruising or bleeding. Allergy: Negative for rash or hives.   Physical Exam: BP (!) 145/86   Pulse 86   Temp (!) 97.2 F (36.2 C) (Oral)   Ht 5' (1.524 m)   Wt 158 lb 9.6 oz (71.9 kg)   LMP  (Exact Date)   BMI 30.97 kg/m  General:   Alert and oriented. Pleasant and cooperative. Well-nourished and well-developed.  Eyes:  Without icterus, sclera clear and conjunctiva pink.  Ears:  Normal auditory acuity. Cardiovascular:  S1, S2 present without murmurs appreciated. Extremities without clubbing or edema. Respiratory:  Clear to auscultation bilaterally. No wheezes, rales, or rhonchi. No distress.  Gastrointestinal:  +BS, soft, non-tender and non-distended. No HSM noted. No guarding or rebound. No masses  appreciated.  Rectal:  Deferred  Musculoskalatal:  Symmetrical without gross deformities. Neurologic:  Alert and oriented x4;  grossly normal neurologically. Psych:  Alert and cooperative. Normal mood and affect. Heme/Lymph/Immune: No excessive bruising noted.    03/15/2018 10:27 AM   Disclaimer: This note was dictated with voice recognition software. Similar sounding words can inadvertently be transcribed and may not be corrected upon review.

## 2018-03-16 NOTE — Progress Notes (Signed)
cc'ed to pcp °

## 2018-04-25 DIAGNOSIS — I219 Acute myocardial infarction, unspecified: Secondary | ICD-10-CM

## 2018-04-25 HISTORY — DX: Acute myocardial infarction, unspecified: I21.9

## 2018-05-04 ENCOUNTER — Other Ambulatory Visit (HOSPITAL_COMMUNITY): Payer: Self-pay | Admitting: Pulmonary Disease

## 2018-05-04 DIAGNOSIS — Z1231 Encounter for screening mammogram for malignant neoplasm of breast: Secondary | ICD-10-CM

## 2018-05-10 ENCOUNTER — Other Ambulatory Visit: Payer: Self-pay

## 2018-05-10 ENCOUNTER — Encounter (HOSPITAL_COMMUNITY): Payer: Self-pay

## 2018-05-10 ENCOUNTER — Encounter (HOSPITAL_COMMUNITY): Payer: Self-pay | Admitting: Emergency Medicine

## 2018-05-10 ENCOUNTER — Inpatient Hospital Stay (HOSPITAL_COMMUNITY)
Admission: EM | Admit: 2018-05-10 | Discharge: 2018-05-11 | DRG: 287 | Disposition: A | Discharge status: Home / Self Care | Payer: Medicare Other | Attending: Cardiovascular Disease | Admitting: Cardiovascular Disease

## 2018-05-10 ENCOUNTER — Emergency Department (HOSPITAL_COMMUNITY): Payer: Medicare Other

## 2018-05-10 ENCOUNTER — Ambulatory Visit (HOSPITAL_COMMUNITY)
Admission: RE | Admit: 2018-05-10 | Discharge: 2018-05-10 | Disposition: A | Discharge status: Home / Self Care | Payer: Medicare Other | Source: Ambulatory Visit | Attending: Pulmonary Disease | Admitting: Pulmonary Disease

## 2018-05-10 DIAGNOSIS — E785 Hyperlipidemia, unspecified: Secondary | ICD-10-CM | POA: Diagnosis not present

## 2018-05-10 DIAGNOSIS — Z800 Family history of malignant neoplasm of digestive organs: Secondary | ICD-10-CM

## 2018-05-10 DIAGNOSIS — Z8249 Family history of ischemic heart disease and other diseases of the circulatory system: Secondary | ICD-10-CM | POA: Diagnosis not present

## 2018-05-10 DIAGNOSIS — E039 Hypothyroidism, unspecified: Secondary | ICD-10-CM | POA: Diagnosis present

## 2018-05-10 DIAGNOSIS — R7989 Other specified abnormal findings of blood chemistry: Secondary | ICD-10-CM

## 2018-05-10 DIAGNOSIS — E669 Obesity, unspecified: Secondary | ICD-10-CM | POA: Diagnosis present

## 2018-05-10 DIAGNOSIS — K219 Gastro-esophageal reflux disease without esophagitis: Secondary | ICD-10-CM | POA: Diagnosis not present

## 2018-05-10 DIAGNOSIS — I10 Essential (primary) hypertension: Secondary | ICD-10-CM | POA: Diagnosis present

## 2018-05-10 DIAGNOSIS — R079 Chest pain, unspecified: Secondary | ICD-10-CM | POA: Diagnosis present

## 2018-05-10 DIAGNOSIS — Z79899 Other long term (current) drug therapy: Secondary | ICD-10-CM

## 2018-05-10 DIAGNOSIS — Z1231 Encounter for screening mammogram for malignant neoplasm of breast: Secondary | ICD-10-CM | POA: Insufficient documentation

## 2018-05-10 DIAGNOSIS — R778 Other specified abnormalities of plasma proteins: Secondary | ICD-10-CM

## 2018-05-10 DIAGNOSIS — Z823 Family history of stroke: Secondary | ICD-10-CM | POA: Diagnosis not present

## 2018-05-10 DIAGNOSIS — R011 Cardiac murmur, unspecified: Secondary | ICD-10-CM | POA: Diagnosis not present

## 2018-05-10 DIAGNOSIS — Z6830 Body mass index (BMI) 30.0-30.9, adult: Secondary | ICD-10-CM

## 2018-05-10 DIAGNOSIS — Z833 Family history of diabetes mellitus: Secondary | ICD-10-CM | POA: Diagnosis not present

## 2018-05-10 DIAGNOSIS — Z79890 Hormone replacement therapy (postmenopausal): Secondary | ICD-10-CM

## 2018-05-10 DIAGNOSIS — Z8042 Family history of malignant neoplasm of prostate: Secondary | ICD-10-CM

## 2018-05-10 DIAGNOSIS — Z888 Allergy status to other drugs, medicaments and biological substances status: Secondary | ICD-10-CM

## 2018-05-10 DIAGNOSIS — I25111 Atherosclerotic heart disease of native coronary artery with angina pectoris with documented spasm: Secondary | ICD-10-CM | POA: Diagnosis not present

## 2018-05-10 DIAGNOSIS — Z9049 Acquired absence of other specified parts of digestive tract: Secondary | ICD-10-CM

## 2018-05-10 DIAGNOSIS — Z90710 Acquired absence of both cervix and uterus: Secondary | ICD-10-CM | POA: Diagnosis not present

## 2018-05-10 DIAGNOSIS — E876 Hypokalemia: Secondary | ICD-10-CM | POA: Diagnosis present

## 2018-05-10 DIAGNOSIS — I249 Acute ischemic heart disease, unspecified: Secondary | ICD-10-CM | POA: Diagnosis present

## 2018-05-10 DIAGNOSIS — Z90721 Acquired absence of ovaries, unilateral: Secondary | ICD-10-CM

## 2018-05-10 LAB — BASIC METABOLIC PANEL
Anion gap: 9 (ref 5–15)
BUN: 14 mg/dL (ref 8–23)
CALCIUM: 9.1 mg/dL (ref 8.9–10.3)
CO2: 28 mmol/L (ref 22–32)
CREATININE: 0.85 mg/dL (ref 0.44–1.00)
Chloride: 100 mmol/L (ref 98–111)
GFR calc non Af Amer: >60 mL/min (ref >60)
Glucose, Bld: 98 mg/dL (ref 70–99)
Potassium: 3.2 mmol/L — ABNORMAL LOW (ref 3.5–5.1)
SODIUM: 137 mmol/L (ref 135–145)

## 2018-05-10 LAB — TROPONIN I
Troponin I: <0.03 ng/mL (ref <0.03)
Troponin I: 0.21 ng/mL (ref <0.03)

## 2018-05-10 LAB — CBC
HCT: 40.4 % (ref 36.0–46.0)
Hemoglobin: 12.2 g/dL (ref 12.0–15.0)
MCH: 27.5 pg (ref 26.0–34.0)
MCHC: 30.2 g/dL (ref 30.0–36.0)
MCV: 91 fL (ref 80.0–100.0)
NRBC: 0 % (ref 0.0–0.2)
PLATELETS: 277 10*3/uL (ref 150–400)
RBC: 4.44 MIL/uL (ref 3.87–5.11)
RDW: 14.5 % (ref 11.5–15.5)
WBC: 6.3 10*3/uL (ref 4.0–10.5)

## 2018-05-10 LAB — APTT: aPTT: >200 seconds (ref 24–36)

## 2018-05-10 LAB — HEPARIN LEVEL (UNFRACTIONATED): Heparin Unfractionated: 1.05 IU/mL — ABNORMAL HIGH (ref 0.30–0.70)

## 2018-05-10 LAB — HM MAMMOGRAPHY

## 2018-05-10 MED ORDER — SODIUM CHLORIDE 0.9% FLUSH: 3.0000 mL | Freq: Once | INTRAVENOUS | Status: DC

## 2018-05-10 MED ORDER — HEPARIN SODIUM (PORCINE) 5000 UNIT/ML IJ SOLN
4000.0000 [IU] | Freq: Once | INTRAMUSCULAR | Status: AC
  Administered 2018-05-10: 4000 [IU] via INTRAVENOUS
  Filled 2018-05-10: qty 1

## 2018-05-10 MED ORDER — HEPARIN (PORCINE) 25000 UT/250ML-% IV SOLN
800.0000 [IU]/h | INTRAVENOUS | Status: DC
  Administered 2018-05-10: 800 [IU]/h via INTRAVENOUS
  Filled 2018-05-10: qty 250

## 2018-05-10 MED ORDER — ASPIRIN 325 MG PO TABS
325.0000 mg | ORAL_TABLET | Freq: Once | ORAL | Status: AC
  Administered 2018-05-10: 325 mg via ORAL
  Filled 2018-05-10: qty 1

## 2018-05-10 NOTE — ED Provider Notes (Signed)
[facility] EMERGENCY DEPARTMENT Provider Note   CSN: [id number] Arrival date & time: 05/10/18  1333     History   Chief Complaint Chief Complaint  Patient presents with  . Chest Pain    HPI Mackenzie Mcpherson is a 68 y.o. female.  Tightness in chest this afternoon approximately 1 PM today after a an argument on the telephone.  Pain radiates to the back.  No dyspnea, diaphoresis, nausea.  No previous history of coronary artery disease or diabetes.  She does have hypertension.  Negative smoking.  Family history negative.  Review of systems positive for headache.     Past Medical History:  Diagnosis Date  . Diverticulitis 2010  . GERD (gastroesophageal reflux disease)    errosive esophagitis in 2010  . Hemorrhoids   . Hiatal hernia   . Hypertension   . Hypothyroidism   . Schatzki's ring    last dilation 2010    Patient Active Problem List   Diagnosis Date Noted  . Chest pain 05/10/2018  . Flatus 03/15/2018  . Family hx of colon cancer   . Family history of colon cancer 12/22/2014  . Hiatal hernia   . Dysphagia 05/20/2014  . Schatzki's ring 05/20/2014  . Diverticulitis 12/21/2011  . Abdominal pain 12/21/2011  . Diarrhea 12/21/2011  . ARM PAIN, RIGHT 06/17/2010  . MEDIAL EPICONDYLITIS 05/05/2010  . LATERAL EPICONDYLITIS 05/05/2010  . BLOOD IN STOOL 11/18/2009  . OBESITY, UNSPECIFIED 07/30/2008  . GERD 07/30/2008  . DYSPHAGIA PHARYNGEAL PHASE 07/30/2008  . HYPERTENSION, HX OF 07/30/2008    Past Surgical History:  Procedure Laterality Date  . ABDOMINAL HYSTERECTOMY  1986   w/ unilateral salpingoopherectomy (can't remember which one)  . APPENDECTOMY    . CHOLECYSTECTOMY    . COLONOSCOPY  11/19/2004   Normal rectum/Normal colon  . COLONOSCOPY  12/11/09   Rourk-minimally friable anal canal/hemorrhoids otherwise normal  . COLONOSCOPY N/A 02/11/2015   Procedure: COLONOSCOPY;  Surgeon: Daneil Dolin, MD;  Location: [facility];  Service: Endoscopy;   Laterality: N/A;  1100 - moved to 10:30 - Candy notified pt  . ESOPHAGOGASTRODUODENOSCOPY  08/15/2008   Moderate size hiatal hernia/Noncritical Schatzki's ring with superimposed distal esophageal erosion consistent with erosive reflux esophagitis  . ESOPHAGOGASTRODUODENOSCOPY N/A 06/11/2014   DVV:[id number] ring dilated/HH     OB History   No obstetric history on file.      Home Medications    Prior to Admission medications   Medication Sig Start Date End Date Taking? Authorizing Provider  Cholecalciferol (VITAMIN D3) 5000 UNITS TABS Take 1 tablet by mouth daily.   Yes [provider]  Ginger, Zingiber officinalis, (GINGER PO) Take 1 tablet by mouth daily.    Yes [provider]  levothyroxine (SYNTHROID, LEVOTHROID) 50 MCG tablet Take 50 mcg by mouth every morning.    Yes [provider]  losartan-hydrochlorothiazide (HYZAAR) 100-12.5 MG per tablet Take 1 tablet by mouth daily.     Yes [provider]  Multiple Vitamins-Minerals (HAIR SKIN & NAILS ADVANCED PO) Take 1 tablet by mouth daily.   Yes [provider]  Multiple Vitamins-Minerals (WOMENS MULTIVITAMIN PLUS) TABS Take 1 tablet by mouth daily.   Yes [provider]  omeprazole (PRILOSEC) 40 MG capsule Take 1 capsule (40 mg total) by mouth daily. 03/15/18  Yes Carlis Stable, NP  Probiotic Product (ALIGN) 4 MG CAPS Take 4 mg by mouth daily.   Yes [provider]  Propylene Glycol (SYSTANE BALANCE) 0.6 %  SOLN Apply 1-2 tablets to eye daily as needed (for dry eye relief).   Yes [provider]  TURMERIC PO Take 1 tablet by mouth daily.    Yes [provider]    Family History Family History  Problem Relation Age of Onset  . Hypertension Father   . Stroke Mother   . Diabetes Mother   . Colon cancer Brother 75  . Colon cancer Brother 59  . Prostate cancer Brother     Social History Social History   Tobacco Use  . Smoking status: Never Smoker    . Smokeless tobacco: Never Used  Substance Use Topics  . Alcohol use: No    Alcohol/week: 0.0 standard drinks  . Drug use: No     Allergies   Pantoprazole sodium   Review of Systems Review of Systems  All other systems reviewed and are negative.    Physical Exam Updated Vital Signs BP (!) 160/99   Pulse 79   Temp 97.7 F (36.5 C) (Temporal)   Resp 13   Ht 4\' 11"  (1.499 m)   Wt 68.9 kg   SpO2 98%   BMI 30.70 kg/m   Physical Exam Vitals signs and nursing note reviewed.  Constitutional:      Appearance: She is well-developed.  HENT:     Head: Normocephalic and atraumatic.  Eyes:     Conjunctiva/sclera: Conjunctivae normal.  Neck:     Musculoskeletal: Neck supple.  Cardiovascular:     Rate and Rhythm: Normal rate and regular rhythm.  Pulmonary:     Effort: Pulmonary effort is normal.     Breath sounds: Normal breath sounds.  Abdominal:     General: Bowel sounds are normal.     Palpations: Abdomen is soft.  Musculoskeletal: Normal range of motion.  Skin:    General: Skin is warm and dry.  Neurological:     Mental Status: She is alert and oriented to person, place, and time.  Psychiatric:        Behavior: Behavior normal.      ED Treatments / Results  Labs (all labs ordered are listed, but only abnormal results are displayed) Labs Reviewed  BASIC METABOLIC PANEL - Abnormal; Notable for the following components:      Result Value   Potassium 3.2 (*)    All other components within normal limits  TROPONIN I - Abnormal; Notable for the following components:   Troponin I 0.21 (*)    All other components within normal limits  CBC  TROPONIN I  APTT  HEPARIN LEVEL (UNFRACTIONATED)  CBC  HEPARIN LEVEL (UNFRACTIONATED)    EKG EKG Interpretation  Date/Time:  Thursday May 10 2018 13:43:19 EST Ventricular Rate:  72 PR Interval:  176 QRS Duration: 76 QT Interval:  364 QTC Calculation: 398 R Axis:   44 Text Interpretation:  Normal sinus rhythm  Normal ECG Confirmed by Nat Christen [phone]) on 05/10/2018 2:30:18 PM   Radiology Dg Chest 2 View  Result Date: 05/10/2018 CLINICAL DATA:  Mid chest pain since this morning. EXAM: CHEST - 2 VIEW COMPARISON:  07/03/2015. FINDINGS: Normal sized heart. Clear lungs. Thoracic spine degenerative changes. Bilateral acromioclavicular joint degenerative changes. IMPRESSION: No acute abnormality. Electronically Signed   By: Claudie Revering M.D.   On: 05/10/2018 14:31   Mm 3d Screen Breast Bilateral  Result Date: 05/10/2018 CLINICAL DATA:  Screening. EXAM: DIGITAL SCREENING BILATERAL MAMMOGRAM WITH TOMO AND CAD COMPARISON:  Previous exam(s). ACR Breast Density Category b: There are scattered  areas of fibroglandular density. FINDINGS: There are no findings suspicious for malignancy. Images were processed with CAD. IMPRESSION: No mammographic evidence of malignancy. A result letter of this screening mammogram will be mailed directly to the patient. RECOMMENDATION: Screening mammogram in one year. (Code:SM-B-01Y) BI-RADS CATEGORY  1: Negative. Electronically Signed   By: Lajean Manes M.D.   On: 05/10/2018 12:43    Procedures Procedures (including critical care time)  Medications Ordered in ED Medications  heparin ADULT infusion 100 units/mL (25000 units/22mL sodium chloride 0.45%) (800 Units/hr Intravenous New Bag/Given 05/10/18 1953)  aspirin tablet 325 mg (325 mg Oral Given 05/10/18 1923)  heparin injection 4,000 Units (4,000 Units Intravenous Given 05/10/18 1952)     Initial Impression / Assessment and Plan / ED Course  I have reviewed the triage vital signs and the nursing notes.  Pertinent labs & imaging results that were available during my care of the patient were reviewed by me and considered in my medical decision making (see chart for details).     History concerning for ACS.  Initial EKG troponin were negative.  Second troponin however was 0.21.  Patient remained hemodynamically stable.  These  findings were discussed with cardiologist on-call.  Will initiate aspirin and heparin.  Transfer to [facility]  Discussed with the patient and her husband.   CRITICAL CARE Performed by: Nat Christen Total critical care time: 35 minutes Critical care time was exclusive of separately billable procedures and treating other patients. Critical care was necessary to treat or prevent imminent or life-threatening deterioration. Critical care was time spent personally by me on the following activities: development of treatment plan with patient and/or surrogate as well as nursing, discussions with consultants, evaluation of patient's response to treatment, examination of patient, obtaining history from patient or surrogate, ordering and performing treatments and interventions, ordering and review of laboratory studies, ordering and review of radiographic studies, pulse oximetry and re-evaluation of patient's condition.  Final Clinical Impressions(s) / ED Diagnoses   Final diagnoses:  Chest pain, unspecified type  Elevated troponin    ED Discharge Orders    None       Nat Christen, MD 05/10/18 2121

## 2018-05-10 NOTE — ED Triage Notes (Signed)
Pt reports chest pain and back pain that was "burning" x 1 hr ago, no other symptoms

## 2018-05-10 NOTE — ED Notes (Signed)
Per pharm. Keep heparin at set rate.

## 2018-05-10 NOTE — ED Notes (Signed)
Date and time results received: 05/10/18 1847 (use smartphrase ".now" to insert current time)  Test: trop Critical Value: 0.21  Name of Provider Notified: cook  Orders Received? Or Actions Taken?: see chart

## 2018-05-10 NOTE — ED Notes (Signed)
[facility] arrived to pick up pt.

## 2018-05-10 NOTE — ED Notes (Signed)
Paged pharm to adjust heparin

## 2018-05-10 NOTE — ED Notes (Signed)
Date and time results received: 05/10/18 .now  (use smartphrase ".now" to insert current time)  Test: aptt  Critical Value: >200  Name of Provider Notified: Doylene Canard  Orders Received? Or Actions Taken?: .

## 2018-05-10 NOTE — ED Triage Notes (Signed)
ekg handed to Dr. McManus 

## 2018-05-10 NOTE — Progress Notes (Signed)
ANTICOAGULATION CONSULT NOTE - Initial Consult  Pharmacy Consult for heparin dosing Indication: ACS/STEMI  Allergies  Allergen Reactions  . Pantoprazole Sodium Anaphylaxis and Hives    Patient Measurements: Height: 4\' 11"  (149.9 cm) Weight: 152 lb (68.9 kg) IBW/kg (Calculated) : 43.2 Heparin Dosing Weight: HEPARIN DW (KG): 58.5  Vital Signs: Temp: 97.7 F (36.5 C) (01/16 1346) Temp Source: Temporal (01/16 1346) BP: 145/87 (01/16 1922) Pulse Rate: 77 (01/16 1922)  Labs: Recent Labs    05/10/18 1415 05/10/18 1808  HGB 12.2  --   HCT 40.4  --   PLT 277  --   CREATININE 0.85  --   TROPONINI <0.03 0.21*    Estimated Creatinine Clearance: 52.8 mL/min (by C-G formula based on SCr of 0.85 mg/dL).   Medical History: Past Medical History:  Diagnosis Date  . Diverticulitis 2010  . GERD (gastroesophageal reflux disease)    errosive esophagitis in 2010  . Hemorrhoids   . Hiatal hernia   . Hypertension   . Hypothyroidism   . Schatzki's ring    last dilation 2010     Assessment: Pharmacy consulted to dose heparin for this 68 yo female for ACS/STEMI.  Last troponin I was 0.21ng/mL.  CBC is within normal limits and patient has not been on any prior anti-coagulation.  Goal of Therapy:  Heparin level 0.3-0.7 units/ml Monitor platelets by anticoagulation protocol: Yes   Plan:  Give 4000 units bolus x 1 Start heparin infusion at 800 units/hr Check anti-Xa level in 6-8 hours and daily while on heparin Continue to monitor H&H and platelets  Despina Pole, Pharm. D. Clinical Pharmacist 05/10/2018 7:43 PM

## 2018-05-11 ENCOUNTER — Encounter (HOSPITAL_COMMUNITY)
Admission: EM | Disposition: A | Discharge status: Home / Self Care | Payer: Self-pay | Source: Home / Self Care | Attending: Cardiovascular Disease

## 2018-05-11 ENCOUNTER — Inpatient Hospital Stay (HOSPITAL_COMMUNITY): Payer: Medicare Other

## 2018-05-11 ENCOUNTER — Encounter (HOSPITAL_COMMUNITY): Payer: Self-pay | Admitting: Cardiovascular Disease

## 2018-05-11 DIAGNOSIS — Z8249 Family history of ischemic heart disease and other diseases of the circulatory system: Secondary | ICD-10-CM | POA: Diagnosis not present

## 2018-05-11 DIAGNOSIS — Z888 Allergy status to other drugs, medicaments and biological substances status: Secondary | ICD-10-CM | POA: Diagnosis not present

## 2018-05-11 DIAGNOSIS — I25111 Atherosclerotic heart disease of native coronary artery with angina pectoris with documented spasm: Secondary | ICD-10-CM | POA: Diagnosis present

## 2018-05-11 DIAGNOSIS — R7989 Other specified abnormal findings of blood chemistry: Secondary | ICD-10-CM | POA: Diagnosis present

## 2018-05-11 DIAGNOSIS — Z90721 Acquired absence of ovaries, unilateral: Secondary | ICD-10-CM | POA: Diagnosis not present

## 2018-05-11 DIAGNOSIS — Z9049 Acquired absence of other specified parts of digestive tract: Secondary | ICD-10-CM | POA: Diagnosis not present

## 2018-05-11 DIAGNOSIS — R011 Cardiac murmur, unspecified: Secondary | ICD-10-CM | POA: Diagnosis present

## 2018-05-11 DIAGNOSIS — Z800 Family history of malignant neoplasm of digestive organs: Secondary | ICD-10-CM | POA: Diagnosis not present

## 2018-05-11 DIAGNOSIS — Z823 Family history of stroke: Secondary | ICD-10-CM | POA: Diagnosis not present

## 2018-05-11 DIAGNOSIS — Z833 Family history of diabetes mellitus: Secondary | ICD-10-CM | POA: Diagnosis not present

## 2018-05-11 DIAGNOSIS — Z90710 Acquired absence of both cervix and uterus: Secondary | ICD-10-CM | POA: Diagnosis not present

## 2018-05-11 DIAGNOSIS — Z8042 Family history of malignant neoplasm of prostate: Secondary | ICD-10-CM | POA: Diagnosis not present

## 2018-05-11 DIAGNOSIS — Z79899 Other long term (current) drug therapy: Secondary | ICD-10-CM | POA: Diagnosis not present

## 2018-05-11 DIAGNOSIS — E669 Obesity, unspecified: Secondary | ICD-10-CM | POA: Diagnosis present

## 2018-05-11 DIAGNOSIS — E039 Hypothyroidism, unspecified: Secondary | ICD-10-CM | POA: Diagnosis present

## 2018-05-11 DIAGNOSIS — I10 Essential (primary) hypertension: Secondary | ICD-10-CM | POA: Diagnosis present

## 2018-05-11 DIAGNOSIS — Z6830 Body mass index (BMI) 30.0-30.9, adult: Secondary | ICD-10-CM | POA: Diagnosis not present

## 2018-05-11 DIAGNOSIS — Z79890 Hormone replacement therapy (postmenopausal): Secondary | ICD-10-CM | POA: Diagnosis not present

## 2018-05-11 DIAGNOSIS — I249 Acute ischemic heart disease, unspecified: Secondary | ICD-10-CM | POA: Diagnosis present

## 2018-05-11 DIAGNOSIS — E876 Hypokalemia: Secondary | ICD-10-CM | POA: Diagnosis present

## 2018-05-11 DIAGNOSIS — E785 Hyperlipidemia, unspecified: Secondary | ICD-10-CM | POA: Diagnosis present

## 2018-05-11 DIAGNOSIS — K219 Gastro-esophageal reflux disease without esophagitis: Secondary | ICD-10-CM | POA: Diagnosis present

## 2018-05-11 HISTORY — PX: LEFT HEART CATH AND CORONARY ANGIOGRAPHY: CATH118249

## 2018-05-11 LAB — HEPARIN LEVEL (UNFRACTIONATED)
Heparin Unfractionated: 0.37 IU/mL (ref 0.30–0.70)
Heparin Unfractionated: 0.61 IU/mL (ref 0.30–0.70)

## 2018-05-11 LAB — TROPONIN I
Troponin I: 0.07 ng/mL (ref <0.03)
Troponin I: 0.07 ng/mL (ref <0.03)
Troponin I: 0.07 ng/mL (ref <0.03)

## 2018-05-11 LAB — BASIC METABOLIC PANEL
Anion gap: 9 (ref 5–15)
BUN: 13 mg/dL (ref 8–23)
CO2: 28 mmol/L (ref 22–32)
Calcium: 8.9 mg/dL (ref 8.9–10.3)
Chloride: 103 mmol/L (ref 98–111)
Creatinine, Ser: 0.95 mg/dL (ref 0.44–1.00)
GFR calc Af Amer: >60 mL/min (ref >60)
GFR calc non Af Amer: >60 mL/min (ref >60)
Glucose, Bld: 108 mg/dL — ABNORMAL HIGH (ref 70–99)
Potassium: 3.4 mmol/L — ABNORMAL LOW (ref 3.5–5.1)
Sodium: 140 mmol/L (ref 135–145)

## 2018-05-11 LAB — CBC
HCT: 37.3 % (ref 36.0–46.0)
Hemoglobin: 11.6 g/dL — ABNORMAL LOW (ref 12.0–15.0)
MCH: 27.3 pg (ref 26.0–34.0)
MCHC: 31.1 g/dL (ref 30.0–36.0)
MCV: 87.8 fL (ref 80.0–100.0)
Platelets: 270 10*3/uL (ref 150–400)
RBC: 4.25 MIL/uL (ref 3.87–5.11)
RDW: 14.3 % (ref 11.5–15.5)
WBC: 4.7 10*3/uL (ref 4.0–10.5)
nRBC: 0 % (ref 0.0–0.2)

## 2018-05-11 LAB — PROTIME-INR
INR: 1.02
Prothrombin Time: 13.3 seconds (ref 11.4–15.2)

## 2018-05-11 LAB — LIPID PANEL
CHOLESTEROL: 197 mg/dL (ref 0–200)
HDL: 34 mg/dL — ABNORMAL LOW (ref >40)
LDL Cholesterol: 135 mg/dL — ABNORMAL HIGH (ref 0–99)
Total CHOL/HDL Ratio: 5.8 RATIO
Triglycerides: 141 mg/dL (ref <150)
VLDL: 28 mg/dL (ref 0–40)

## 2018-05-11 LAB — HIV ANTIBODY (ROUTINE TESTING W REFLEX): HIV Screen 4th Generation wRfx: NONREACTIVE

## 2018-05-11 SURGERY — LEFT HEART CATH AND CORONARY ANGIOGRAPHY
Anesthesia: LOCAL

## 2018-05-11 MED ORDER — SODIUM CHLORIDE 0.9 % IV SOLN: 250.0000 mL | INTRAVENOUS | Status: DC | PRN

## 2018-05-11 MED ORDER — METOPROLOL TARTRATE 12.5 MG HALF TABLET
12.5000 mg | ORAL_TABLET | Freq: Two times a day (BID) | ORAL | Status: DC
  Filled 2018-05-11: qty 1

## 2018-05-11 MED ORDER — VERAPAMIL HCL 2.5 MG/ML IV SOLN
INTRAVENOUS | Status: AC
  Filled 2018-05-11: qty 2

## 2018-05-11 MED ORDER — ASPIRIN 81 MG PO CHEW
CHEWABLE_TABLET | ORAL | Status: DC | PRN
  Administered 2018-05-11: 81 mg via ORAL

## 2018-05-11 MED ORDER — NITROGLYCERIN 1 MG/10 ML FOR IR/CATH LAB
INTRA_ARTERIAL | Status: DC | PRN
  Administered 2018-05-11: 200 ug via INTRACORONARY
  Administered 2018-05-11: 100 ug via INTRACORONARY

## 2018-05-11 MED ORDER — NITROGLYCERIN 0.4 MG SL SUBL: 0.4000 mg | SUBLINGUAL_TABLET | SUBLINGUAL | 1 refills | Status: DC | PRN

## 2018-05-11 MED ORDER — POTASSIUM CHLORIDE CRYS ER 20 MEQ PO TBCR
20.0000 meq | EXTENDED_RELEASE_TABLET | Freq: Once | ORAL | Status: AC
  Administered 2018-05-11: 20 meq via ORAL
  Filled 2018-05-11: qty 1

## 2018-05-11 MED ORDER — SODIUM CHLORIDE 0.9% FLUSH: 3.0000 mL | Freq: Two times a day (BID) | INTRAVENOUS | Status: DC

## 2018-05-11 MED ORDER — ACETAMINOPHEN 325 MG PO TABS: 650.0000 mg | ORAL_TABLET | ORAL | Status: DC | PRN

## 2018-05-11 MED ORDER — ASPIRIN 81 MG PO CHEW: 81.0000 mg | CHEWABLE_TABLET | ORAL | Status: DC

## 2018-05-11 MED ORDER — NITROGLYCERIN 1 MG/10 ML FOR IR/CATH LAB
INTRA_ARTERIAL | Status: AC
  Filled 2018-05-11: qty 10

## 2018-05-11 MED ORDER — MIDAZOLAM HCL 2 MG/2ML IJ SOLN
INTRAMUSCULAR | Status: AC
  Filled 2018-05-11: qty 2

## 2018-05-11 MED ORDER — CLOPIDOGREL BISULFATE 75 MG PO TABS
75.0000 mg | ORAL_TABLET | Freq: Every day | ORAL | Status: DC
  Administered 2018-05-11: 75 mg via ORAL
  Filled 2018-05-11: qty 1

## 2018-05-11 MED ORDER — IOHEXOL 350 MG/ML SOLN
INTRAVENOUS | Status: DC | PRN
  Administered 2018-05-11: 85 mL via INTRA_ARTERIAL

## 2018-05-11 MED ORDER — SODIUM CHLORIDE 0.9 % WEIGHT BASED INFUSION: 1.0000 mL/kg/h | INTRAVENOUS | Status: DC

## 2018-05-11 MED ORDER — CLOPIDOGREL BISULFATE 75 MG PO TABS: 75.0000 mg | ORAL_TABLET | Freq: Every day | ORAL | 2 refills | Status: DC

## 2018-05-11 MED ORDER — SODIUM CHLORIDE 0.9% FLUSH: 3.0000 mL | INTRAVENOUS | Status: DC | PRN

## 2018-05-11 MED ORDER — HEPARIN SODIUM (PORCINE) 1000 UNIT/ML IJ SOLN
INTRAMUSCULAR | Status: AC
  Filled 2018-05-11: qty 1

## 2018-05-11 MED ORDER — LIDOCAINE HCL (PF) 1 % IJ SOLN
INTRAMUSCULAR | Status: DC | PRN
  Administered 2018-05-11 (×2): 2 mL via INTRADERMAL

## 2018-05-11 MED ORDER — HEPARIN SODIUM (PORCINE) 1000 UNIT/ML IJ SOLN
INTRAMUSCULAR | Status: DC | PRN
  Administered 2018-05-11: 3500 [IU] via INTRAVENOUS

## 2018-05-11 MED ORDER — HEPARIN (PORCINE) IN NACL 1000-0.9 UT/500ML-% IV SOLN
INTRAVENOUS | Status: AC
  Filled 2018-05-11: qty 1000

## 2018-05-11 MED ORDER — SODIUM CHLORIDE 0.9 % WEIGHT BASED INFUSION: 3.0000 mL/kg/h | INTRAVENOUS | Status: AC

## 2018-05-11 MED ORDER — NITROGLYCERIN 0.4 MG SL SUBL: 0.4000 mg | SUBLINGUAL_TABLET | SUBLINGUAL | Status: DC | PRN

## 2018-05-11 MED ORDER — ASPIRIN 81 MG PO CHEW
CHEWABLE_TABLET | ORAL | Status: AC
  Filled 2018-05-11: qty 1

## 2018-05-11 MED ORDER — ASPIRIN EC 81 MG PO TBEC: 81.0000 mg | DELAYED_RELEASE_TABLET | Freq: Every day | ORAL | Status: DC

## 2018-05-11 MED ORDER — ASPIRIN 81 MG PO TBEC: 81.0000 mg | DELAYED_RELEASE_TABLET | Freq: Every day | ORAL | Status: AC

## 2018-05-11 MED ORDER — FENTANYL CITRATE (PF) 100 MCG/2ML IJ SOLN
INTRAMUSCULAR | Status: DC | PRN
  Administered 2018-05-11 (×2): 25 ug via INTRAVENOUS

## 2018-05-11 MED ORDER — FENTANYL CITRATE (PF) 100 MCG/2ML IJ SOLN
INTRAMUSCULAR | Status: AC
  Filled 2018-05-11: qty 2

## 2018-05-11 MED ORDER — ATORVASTATIN CALCIUM 40 MG PO TABS
40.0000 mg | ORAL_TABLET | Freq: Every day | ORAL | Status: DC
  Administered 2018-05-11: 40 mg via ORAL
  Filled 2018-05-11: qty 1

## 2018-05-11 MED ORDER — MIDAZOLAM HCL 2 MG/2ML IJ SOLN
INTRAMUSCULAR | Status: DC | PRN
  Administered 2018-05-11 (×2): 1 mg via INTRAVENOUS

## 2018-05-11 MED ORDER — LIDOCAINE HCL (PF) 1 % IJ SOLN
INTRAMUSCULAR | Status: AC
  Filled 2018-05-11: qty 30

## 2018-05-11 MED ORDER — VERAPAMIL HCL 2.5 MG/ML IV SOLN
INTRAVENOUS | Status: DC | PRN
  Administered 2018-05-11: 10 mL via INTRA_ARTERIAL

## 2018-05-11 MED ORDER — HEPARIN (PORCINE) IN NACL 1000-0.9 UT/500ML-% IV SOLN
INTRAVENOUS | Status: DC | PRN
  Administered 2018-05-11 (×2): 500 mL

## 2018-05-11 MED ORDER — ONDANSETRON HCL 4 MG/2ML IJ SOLN: 4.0000 mg | Freq: Four times a day (QID) | INTRAMUSCULAR | Status: DC | PRN

## 2018-05-11 MED ORDER — SODIUM CHLORIDE 0.9 % IV SOLN: INTRAVENOUS | Status: AC

## 2018-05-11 MED ORDER — METOPROLOL TARTRATE 25 MG PO TABS: 12.5000 mg | ORAL_TABLET | Freq: Two times a day (BID) | ORAL | 3 refills | Status: DC

## 2018-05-11 MED ORDER — ATORVASTATIN CALCIUM 40 MG PO TABS: 40.0000 mg | ORAL_TABLET | Freq: Every day | ORAL | 3 refills | Status: DC

## 2018-05-11 SURGICAL SUPPLY — 11 items
CATH 5FR JL3.5 JR4 ANG PIG MP (CATHETERS) ×1 IMPLANT
DEVICE RAD COMP TR BAND LRG (VASCULAR PRODUCTS) ×1 IMPLANT
GLIDESHEATH SLEND A-KIT 6F 22G (SHEATH) IMPLANT
GLIDESHEATH SLEND SS 6F .021 (SHEATH) ×1 IMPLANT
GUIDEWIRE INQWIRE 1.5J.035X260 (WIRE) IMPLANT
INQWIRE 1.5J .035X260CM (WIRE) ×2
KIT HEART LEFT (KITS) ×2 IMPLANT
PACK CARDIAC CATHETERIZATION (CUSTOM PROCEDURE TRAY) ×2 IMPLANT
SHEATH PROBE COVER 6X72 (BAG) ×1 IMPLANT
SYR MEDRAD MARK 7 150ML (SYRINGE) ×2 IMPLANT
TRANSDUCER W/STOPCOCK (MISCELLANEOUS) ×2 IMPLANT

## 2018-05-11 NOTE — H&P (Signed)
Referring Physician: [facility]  Mackenzie Mcpherson is an 68 y.o. female.                       Chief Complaint: Chest pain  HPI: 68 year old female had chest tightness at 1 PM yesterday after an argument on the telephone. Chest pain is precordial radiating to back without nausea, dyspnea or sweating. PMH include hypertension, hypothyroidism, GERD and is negative for smoking, alcohol intake or drug intake or hyperlipidemia or type 2 DM.  Past Medical History:  Diagnosis Date  . Diverticulitis 2010  . GERD (gastroesophageal reflux disease)    errosive esophagitis in 2010  . Hemorrhoids   . Hiatal hernia   . Hypertension   . Hypothyroidism   . Schatzki's ring    last dilation 2010      Past Surgical History:  Procedure Laterality Date  . ABDOMINAL HYSTERECTOMY  1986   w/ unilateral salpingoopherectomy (can't remember which one)  . APPENDECTOMY    . CHOLECYSTECTOMY    . COLONOSCOPY  11/19/2004   Normal rectum/Normal colon  . COLONOSCOPY  12/11/09   Rourk-minimally friable anal canal/hemorrhoids otherwise normal  . COLONOSCOPY N/A 02/11/2015   Procedure: COLONOSCOPY;  Surgeon: Daneil Dolin, MD;  [facility];  Service: Endoscopy;  Laterality: N/A;  1100 - moved to 10:30 - Candy notified pt  . ESOPHAGOGASTRODUODENOSCOPY  08/15/2008   Moderate size hiatal hernia/Noncritical Schatzki's ring with superimposed distal esophageal erosion consistent with erosive reflux esophagitis  . ESOPHAGOGASTRODUODENOSCOPY N/A 06/11/2014   [id number] ring dilated/HH    Family History  Problem Relation Age of Onset  . Hypertension Father   . Stroke Mother   . Diabetes Mother   . Colon cancer Brother 70  . Colon cancer Brother 80  . Prostate cancer Brother    Social History:  reports that she has never smoked. She has never used smokeless tobacco. She reports that she does not drink alcohol or use drugs.  Allergies:  Allergies  Allergen Reactions  . Pantoprazole  Sodium Anaphylaxis and Hives    Medications Prior to Admission  Medication Sig Dispense Refill  . Cholecalciferol (VITAMIN D3) 5000 UNITS TABS Take 1 tablet by mouth daily.    . Ginger, Zingiber officinalis, (GINGER PO) Take 1 tablet by mouth daily.     Marland Kitchen levothyroxine (SYNTHROID, LEVOTHROID) 50 MCG tablet Take 50 mcg by mouth every morning.     Marland Kitchen losartan-hydrochlorothiazide (HYZAAR) 100-12.5 MG per tablet Take 1 tablet by mouth daily.      . Multiple Vitamins-Minerals (HAIR SKIN & NAILS ADVANCED PO) Take 1 tablet by mouth daily.    . Multiple Vitamins-Minerals (WOMENS MULTIVITAMIN PLUS) TABS Take 1 tablet by mouth daily.    Marland Kitchen omeprazole (PRILOSEC) 40 MG capsule Take 1 capsule (40 mg total) by mouth daily. 90 capsule 3  . Probiotic Product (ALIGN) 4 MG CAPS Take 4 mg by mouth daily.    Marland Kitchen Propylene Glycol (SYSTANE BALANCE) 0.6 % SOLN Apply 1-2 tablets to eye daily as needed (for dry eye relief).    . TURMERIC PO Take 1 tablet by mouth daily.       Results for orders placed or performed during the hospital encounter of 05/10/18 (from the past 48 hour(s))  Basic metabolic panel     Status: Abnormal   Collection Time: 05/10/18  2:15 PM  Result Value Ref Range   Sodium 137 135 - 145 mmol/L   Potassium 3.2 (  L) 3.5 - 5.1 mmol/L   Chloride 100 98 - 111 mmol/L   CO2 28 22 - 32 mmol/L   Glucose, Bld 98 70 - 99 mg/dL   BUN 14 8 - 23 mg/dL   Creatinine, Ser 0.85 0.44 - 1.00 mg/dL   Calcium 9.1 8.9 - 10.3 mg/dL   GFR calc non Af Amer >60 >60 mL/min   GFR calc Af Amer >60 >60 mL/min   Anion gap 9 5 - 15    Comment: Performed at [facility], [address] [place], [place] [address]  CBC     Status: None   Collection Time: 05/10/18  2:15 PM  Result Value Ref Range   WBC 6.3 4.0 - 10.5 K/uL   RBC 4.44 3.87 - 5.11 MIL/uL   Hemoglobin 12.2 12.0 - 15.0 g/dL   HCT 40.4 36.0 - 46.0 %   MCV 91.0 80.0 - 100.0 fL   MCH 27.5 26.0 - 34.0 pg   MCHC 30.2 30.0 - 36.0 g/dL   RDW 14.5 11.5 - 15.5 %    Platelets 277 150 - 400 K/uL   nRBC 0.0 0.0 - 0.2 %    Comment: Performed at [facility], [address] [place], [place] [address]  Troponin I - Once     Status: None   Collection Time: 05/10/18  2:15 PM  Result Value Ref Range   Troponin I <0.03 <0.03 ng/mL    Comment: Performed at [facility], [address], [place], [place] [address]  Troponin I - ONCE - STAT     Status: Abnormal   Collection Time: 05/10/18  6:08 PM  Result Value Ref Range   Troponin I 0.21 (HH) <0.03 ng/mL    Comment: CRITICAL RESULT CALLED TO, READ BACK BY AND VERIFIED WITH: [facility] ON 05/10/18 AT 1845 BY LOY,C Performed at [facility], [address], [place], [place] [address]   APTT     Status: Abnormal   Collection Time: 05/10/18  8:08 PM  Result Value Ref Range   aPTT >200 (HH) 24 - 36 seconds    Comment:        IF BASELINE aPTT IS ELEVATED, SUGGEST PATIENT RISK ASSESSMENT BE USED TO DETERMINE APPROPRIATE ANTICOAGULANT THERAPY. CRITICAL RESULT CALLED TO, READ BACK BY AND VERIFIED WITH: K NICHOLS,RN @2120  05/10/18 MKELLY Performed at [facility], [address] [place], [name] [address]   Heparin level (unfractionated)     Status: Abnormal   Collection Time: 05/10/18  8:08 PM  Result Value Ref Range   Heparin Unfractionated 1.05 (H) 0.30 - 0.70 IU/mL    Comment: (NOTE) If heparin results are below expected values, and patient dosage has  been confirmed, suggest follow up testing of antithrombin III levels. Performed at [facility], [address] [place], [place] [address]    Dg Chest 2 View  Result Date: 05/10/2018 CLINICAL DATA:  Mid chest pain since this morning. EXAM: CHEST - 2 VIEW COMPARISON:  07/03/2015. FINDINGS: Normal sized heart. Clear lungs. Thoracic spine degenerative changes. Bilateral acromioclavicular joint degenerative changes. IMPRESSION: No acute abnormality. Electronically Signed   By: Claudie Revering M.D.   On: 05/10/2018 14:31   Mm 3d Screen Breast Bilateral  Result  Date: 05/10/2018 CLINICAL DATA:  Screening. EXAM: DIGITAL SCREENING BILATERAL MAMMOGRAM WITH TOMO AND CAD COMPARISON:  Previous exam(s). ACR Breast Density Category b: There are scattered areas of fibroglandular density. FINDINGS: There are no findings suspicious for malignancy. Images were processed with CAD. IMPRESSION: No mammographic  evidence of malignancy. A result letter of this screening mammogram will be mailed directly to the patient. RECOMMENDATION: Screening mammogram in one year. (Code:SM-B-01Y) BI-RADS CATEGORY  1: Negative. Electronically Signed   By: Lajean Manes M.D.   On: 05/10/2018 12:43    Review Of Systems Constitutional: No fever, chills, weight loss or gain. Eyes: No vision change, wears glasses. No discharge or pain. Ears: No hearing loss, No tinnitus. Respiratory: No asthma, COPD, pneumonias. No shortness of breath. No hemoptysis. Cardiovascular: Positive chest pain, no palpitation, no leg edema. Gastrointestinal: No nausea, vomiting, diarrhea, constipation. No GI bleed. No hepatitis. Genitourinary: No dysuria, hematuria, kidney stone. No incontinance. Neurological: No headache, stroke, seizures.  Psychiatry: No psych facility admission for anxiety, depression, suicide. No detox. Skin: No rash. Musculoskeletal: Positive joint pain, no fibromyalgia. No neck pain, back pain. Lymphadenopathy: No lymphadenopathy. Hematology: No anemia or easy bruising.   Blood pressure 133/65, pulse 70, temperature 97.7 F (36.5 C), temperature source Oral, resp. rate 12, height 4\' 11"  (1.499 m), weight 69.1 kg, SpO2 99 %. Body mass index is 30.76 kg/m. General appearance: alert, cooperative, appears stated age and no distress Head: Normocephalic, atraumatic. Eyes: Brown eyes, pink conjunctiva, corneas clear. PERRL, EOM's intact. Neck: No adenopathy, no carotid bruit, no JVD, supple, symmetrical, trachea midline and thyroid not enlarged. Resp: Clear to auscultation bilaterally. Cardio:  Regular rate and rhythm, S1, S2 normal, II/VI systolic murmur, no click, rub or gallop GI: Soft, non-tender; bowel sounds normal; no organomegaly. Extremities: No edema, cyanosis or clubbing. Skin: Warm and dry.  Neurologic: Alert and oriented X 3, normal strength. Normal coordination and gait.  Assessment/Plan Acute coronary syndrome Hypertension Obesity GERD Hypothyroidism Hypokalemia  IV heparin. Cycle Troponin Cardiac cath in AM.   Birdie Riddle, MD  05/11/2018, 3:21 AM

## 2018-05-11 NOTE — Progress Notes (Signed)
[facility] for Heparin  Indication: ACS/STEMI  Allergies  Allergen Reactions  . Pantoprazole Sodium Anaphylaxis and Hives    Patient Measurements: Height: 4\' 11"  (149.9 cm) Weight: 152 lb 4.8 oz (69.1 kg) IBW/kg (Calculated) : 43.2 Heparin Dosing Weight: HEPARIN DW (KG): 58.5  Vital Signs: Temp: 97.7 F (36.5 C) (01/16 2302) Temp Source: Oral (01/16 2302) BP: 133/65 (01/16 2302) Pulse Rate: 70 (01/16 2302)  Labs: Recent Labs    05/10/18 1415 05/10/18 1808 05/10/18 2008 05/11/18 0327  HGB 12.2  --   --  11.6*  HCT 40.4  --   --  37.3  PLT 277  --   --  270  APTT  --   --  >200*  --   LABPROT  --   --   --  13.3  INR  --   --   --  1.02  HEPARINUNFRC  --   --  1.05* 0.37  CREATININE 0.85  --   --   --   TROPONINI <0.03 0.21*  --   --     Estimated Creatinine Clearance: 52.9 mL/min (by C-G formula based on SCr of 0.85 mg/dL).   Medical History: Past Medical History:  Diagnosis Date  . Diverticulitis 2010  . GERD (gastroesophageal reflux disease)    errosive esophagitis in 2010  . Hemorrhoids   . Hiatal hernia   . Hypertension   . Hypothyroidism   . Schatzki's ring    last dilation 2010     Assessment: Pharmacy consulted to dose heparin for this 68 yo female for ACS/STEMI.  Last troponin I was 0.21ng/mL.  CBC is within normal limits and patient has not been on any prior anti-coagulation.  1/17 AM update: initial heparin level is therapeutic, plans for cath later today per MD note  Goal of Therapy:  Heparin level 0.3-0.7 units/ml Monitor platelets by anticoagulation protocol: Yes   Plan:  Cont heparin at 800 units/hr Confirmatory heparin level at [place], PharmD, [place] Pharmacist Phone: [phone]

## 2018-05-11 NOTE — Discharge Summary (Signed)
Physician Discharge Summary  Patient ID: Mackenzie Mcpherson MRN: [id number] DOB/AGE: 12/15/1950 68 y.o.  Admit date: 05/10/2018 Discharge date: 05/11/2018  Admission Diagnoses: Acute coronary syndrome Hypertension Obesity GERD Hypothyroidism Hypokalemia  Discharge Diagnoses:  Principle Problem: Chest pain Active Problems:   Coronary microvascular disease   Stress induced coronary vasospasm   Hypertension   Hyperlipidemia   Hypokalemia   Hypothyroidism   GERD  Discharged Condition: fair  Hospital Course: 68 year old female had chest pain after an argument at 1 PM on 05/09/2018. The chest pain radiated to back. Her troponin was minimally elevated. She underwent cardiac catheterization that showed severe vasospasm of proximal RCA, improving significantly with Intracoronary Nitroglycerin use. She remained stable post procedure.  She was started on Aspirin, SL NTG, Plavix and Atorvastatin.  She will follow life style modification of heart healthy diet and activity. She will decrease stress with relaxation, Yoga and exercise. She will see me in 1 week and primary care physician in 1 month.  Consults: cardiology  Significant Diagnostic Studies: labs: Normal CBC. Near normal BMET with mild hypokalemia improving with potasium supplement.   EKG: NSR.   Chest x-ray : Unremarkable.  Cardiac cath: Near normal coronaries except severe spasm of osteal and proximal right coronary artery improving significantly with intracoronary NTG use.  Treatments: cardiac meds: Aspirin, Palvix, metoprolol and atorvastatin.  Discharge Exam: Blood pressure 122/60, pulse 72, temperature 97.7 F (36.5 C), temperature source Oral, resp. rate 12, height 4\' 11"  (1.499 m), weight 69.1 kg, SpO2 100 %. General appearance: alert, cooperative and appears stated age. Head: Normocephalic, atraumatic. Eyes: Brown eyes, pink conjunctiva, corneas clear. PERRL, EOM's intact.  Neck: No adenopathy, no carotid bruit,  no JVD, supple, symmetrical, trachea midline and thyroid not enlarged. Resp: Clear to auscultation bilaterally. Cardio: Regular rate and rhythm, S1, S2 normal, II/VI systolic murmur, no click, rub or gallop. GI: Soft, non-tender; bowel sounds normal; no organomegaly. Extremities: No edema, cyanosis or clubbing. Right radial pressure dressing. Skin: Warm and dry.  Neurologic: Alert and oriented X 3, normal strength and tone. Normal coordination and gait.  Disposition: Discharge disposition: 01-Home or Self Care        Allergies as of 05/11/2018      Reactions   Pantoprazole Sodium Anaphylaxis, Hives      Medication List    STOP taking these medications   omeprazole 40 MG capsule Commonly known as:  PRILOSEC     TAKE these medications   ALIGN 4 MG Caps Take 4 mg by mouth daily.   aspirin 81 MG EC tablet Take 1 tablet (81 mg total) by mouth daily. Start taking on:  May 12, 2018   atorvastatin 40 MG tablet Commonly known as:  LIPITOR Take 1 tablet (40 mg total) by mouth daily at 6 PM.   clopidogrel 75 MG tablet Commonly known as:  PLAVIX Take 1 tablet (75 mg total) by mouth daily.   GINGER PO Take 1 tablet by mouth daily.   levothyroxine 50 MCG tablet Commonly known as:  SYNTHROID, LEVOTHROID Take 50 mcg by mouth every morning.   losartan-hydrochlorothiazide 100-12.5 MG tablet Commonly known as:  HYZAAR Take 1 tablet by mouth daily.   metoprolol tartrate 25 MG tablet Commonly known as:  LOPRESSOR Take 0.5 tablets (12.5 mg total) by mouth 2 (two) times daily.   nitroGLYCERIN 0.4 MG SL tablet Commonly known as:  NITROSTAT Place 1 tablet (0.4 mg total) under the tongue every 5 (five) minutes x 3 doses as needed  for chest pain.   SYSTANE BALANCE 0.6 % Soln Generic drug:  Propylene Glycol Apply 1-2 tablets to eye daily as needed (for dry eye relief).   TURMERIC PO Take 1 tablet by mouth daily.   Vitamin D3 125 MCG (5000 UT) Tabs Take 1 tablet by mouth  daily.   HAIR SKIN & NAILS ADVANCED PO Take 1 tablet by mouth daily.   WOMENS MULTIVITAMIN PLUS Tabs Take 1 tablet by mouth daily.      Follow-up Information    Sinda Du, MD. Schedule an appointment as soon as possible for a visit in 1 month(s).   Specialty:  Pulmonary Disease Contact information: [address] [name] [address] [phone]        Dixie Dials, MD. Schedule an appointment as soon as possible for a visit in 1 week(s).   Specialty:  Cardiology Contact information: [name] [address] [phone]           Signed: Birdie Riddle 05/11/2018, 5:25 PM

## 2018-05-11 NOTE — Interval H&P Note (Signed)
History and Physical Interval Note:  05/11/2018 9:18 AM  Mackenzie Mcpherson  has presented today for surgery, with the diagnosis of UA  The various methods of treatment have been discussed with the patient and family. After consideration of risks, benefits and other options for treatment, the patient has consented to  Procedure(s): LEFT HEART CATH AND CORONARY ANGIOGRAPHY (N/A) as a surgical intervention .  The patient's history has been reviewed, patient examined, no change in status, stable for surgery.  I have reviewed the patient's chart and labs.  Questions were answered to the patient's satisfaction.     Birdie Riddle

## 2019-04-13 ENCOUNTER — Ambulatory Visit
Admission: EM | Admit: 2019-04-13 | Discharge: 2019-04-13 | Disposition: A | Discharge status: Home / Self Care | Payer: Federal, State, Local not specified - PPO | Attending: Emergency Medicine | Admitting: Emergency Medicine

## 2019-04-13 ENCOUNTER — Other Ambulatory Visit: Payer: Self-pay

## 2019-04-13 DIAGNOSIS — N3001 Acute cystitis with hematuria: Secondary | ICD-10-CM

## 2019-04-13 LAB — POCT URINALYSIS DIP (MANUAL ENTRY)
Bilirubin, UA: NEGATIVE
Glucose, UA: 100 mg/dL — AB
Ketones, POC UA: NEGATIVE mg/dL
Nitrite, UA: POSITIVE — AB
Protein Ur, POC: NEGATIVE mg/dL
Spec Grav, UA: 1.015 (ref 1.010–1.025)
Urobilinogen, UA: 0.2 E.U./dL
pH, UA: 5.5 (ref 5.0–8.0)

## 2019-04-13 MED ORDER — PHENAZOPYRIDINE HCL 100 MG PO TABS: 100.0000 mg | ORAL_TABLET | Freq: Three times a day (TID) | ORAL | 0 refills | Status: DC | PRN

## 2019-04-13 MED ORDER — SULFAMETHOXAZOLE-TRIMETHOPRIM 800-160 MG PO TABS: 1.0000 | ORAL_TABLET | Freq: Two times a day (BID) | ORAL | 0 refills | Status: AC

## 2019-04-13 NOTE — ED Provider Notes (Signed)
[facility]    CSN: [id number] Arrival date & time: 04/13/19  [id number]      History   Chief Complaint Chief Complaint  Patient presents with  . Dysuria    HPI Mackenzie Mcpherson is a 68 y.o. female.   Patient also complains of dysuria that began on 2 days ago.  She denies a precipitating event or recent sexual encounter.  She has tried OTC azole without relief.  Her symptoms are made worse with urination.  She reports similar symptoms in the past that improved with antibiotic in the past.  She complains of decreased amount, increased urgency.  She denies fever, chills, nausea, vomiting, abdominal pain, flank pain, hematuria, or incontinence  The history is provided by the patient. No language interpreter was used.  Dysuria   Past Medical History:  Diagnosis Date  . Diverticulitis 2010  . GERD (gastroesophageal reflux disease)    errosive esophagitis in 2010  . Hemorrhoids   . Hiatal hernia   . Hypertension   . Hypothyroidism   . Schatzki's ring    last dilation 2010    Patient Active Problem List   Diagnosis Date Noted  . Acute coronary syndrome ([place]) 05/11/2018  . Chest pain 05/10/2018  . Flatus 03/15/2018  . Family hx of colon cancer   . Family history of colon cancer 12/22/2014  . Hiatal hernia   . Dysphagia 05/20/2014  . Schatzki's ring 05/20/2014  . Diverticulitis 12/21/2011  . Abdominal pain 12/21/2011  . Diarrhea 12/21/2011  . ARM PAIN, RIGHT 06/17/2010  . MEDIAL EPICONDYLITIS 05/05/2010  . LATERAL EPICONDYLITIS 05/05/2010  . BLOOD IN STOOL 11/18/2009  . OBESITY, UNSPECIFIED 07/30/2008  . GERD 07/30/2008  . DYSPHAGIA PHARYNGEAL PHASE 07/30/2008  . HYPERTENSION, HX OF 07/30/2008    Past Surgical History:  Procedure Laterality Date  . ABDOMINAL HYSTERECTOMY  1986   w/ unilateral salpingoopherectomy (can't remember which one)  . APPENDECTOMY    . CHOLECYSTECTOMY    . COLONOSCOPY  11/19/2004   Normal rectum/Normal colon  .  COLONOSCOPY  12/11/09   Rourk-minimally friable anal canal/hemorrhoids otherwise normal  . COLONOSCOPY N/A 02/11/2015   Procedure: COLONOSCOPY;  Surgeon: Daneil Dolin, MD;  [facility];  Service: Endoscopy;  Laterality: N/A;  1100 - moved to 10:30 - [place] notified pt  . ESOPHAGOGASTRODUODENOSCOPY  08/15/2008   Moderate size hiatal hernia/Noncritical Schatzki's ring with superimposed distal esophageal erosion consistent with erosive reflux esophagitis  . ESOPHAGOGASTRODUODENOSCOPY N/A 06/11/2014   [id number] ring dilated/HH  . LEFT HEART CATH AND CORONARY ANGIOGRAPHY N/A 05/11/2018   Procedure: LEFT HEART CATH AND CORONARY ANGIOGRAPHY;  Surgeon: Dixie Dials, MD;  Location: [facility];  Service: Cardiovascular;  Laterality: N/A;    OB History   No obstetric history on file.      Home Medications    Prior to Admission medications   Medication Sig Start Date End Date Taking? Authorizing Provider  clopidogrel (PLAVIX) 75 MG tablet Take 1 tablet (75 mg total) by mouth daily. 05/11/18  Yes Dixie Dials, MD  aspirin EC 81 MG EC tablet Take 1 tablet (81 mg total) by mouth daily. 05/12/18   Dixie Dials, MD  atorvastatin (LIPITOR) 40 MG tablet Take 1 tablet (40 mg total) by mouth daily at 6 PM. 05/11/18   Dixie Dials, MD  Cholecalciferol (VITAMIN D3) 5000 UNITS TABS Take 1 tablet by mouth daily.    [provider]  Ginger, Zingiber officinalis, (GINGER PO) Take 1 tablet by  mouth daily.     [provider]  levothyroxine (SYNTHROID, LEVOTHROID) 50 MCG tablet Take 50 mcg by mouth every morning.     [provider]  losartan-hydrochlorothiazide (HYZAAR) 100-12.5 MG per tablet Take 1 tablet by mouth daily.      [provider]  metoprolol tartrate (LOPRESSOR) 25 MG tablet Take 0.5 tablets (12.5 mg total) by mouth 2 (two) times daily. 05/11/18   Dixie Dials, MD  Multiple Vitamins-Minerals (HAIR SKIN & NAILS ADVANCED PO) Take 1 tablet by  mouth daily.    [provider]  Multiple Vitamins-Minerals (WOMENS MULTIVITAMIN PLUS) TABS Take 1 tablet by mouth daily.    [provider]  nitroGLYCERIN (NITROSTAT) 0.4 MG SL tablet Place 1 tablet (0.4 mg total) under the tongue every 5 (five) minutes x 3 doses as needed for chest pain. 05/11/18   Dixie Dials, MD  Probiotic Product (ALIGN) 4 MG CAPS Take 4 mg by mouth daily.    [provider]  Propylene Glycol (SYSTANE BALANCE) 0.6 % SOLN Apply 1-2 tablets to eye daily as needed (for dry eye relief).    [provider]  sulfamethoxazole-trimethoprim (BACTRIM DS) 800-160 MG tablet Take 1 tablet by mouth 2 (two) times daily for 3 days. 04/13/19 04/16/19  Keaunna Skipper, Darrelyn Hillock, FNP  TURMERIC PO Take 1 tablet by mouth daily.     [provider]    Family History Family History  Problem Relation Age of Onset  . Hypertension Father   . Stroke Mother   . Diabetes Mother   . Colon cancer Brother 58  . Colon cancer Brother 35  . Prostate cancer Brother     Social History Social History   Tobacco Use  . Smoking status: Never Smoker  . Smokeless tobacco: Never Used  Substance Use Topics  . Alcohol use: No    Alcohol/week: 0.0 standard drinks  . Drug use: No     Allergies   Pantoprazole sodium   Review of Systems Review of Systems  Constitutional: Negative.   Respiratory: Negative.   Cardiovascular: Negative.   Gastrointestinal: Negative.   Genitourinary: Positive for dysuria and urgency.  ROS: All other are negatives   Physical Exam Triage Vital Signs ED Triage Vitals  Enc Vitals Group     BP 04/13/19 0937 132/69     Pulse Rate 04/13/19 0937 63     Resp 04/13/19 0937 18     Temp 04/13/19 0937 97.9 F (36.6 C)     Temp src --      SpO2 04/13/19 0937 94 %     Weight --      Height --      Head Circumference --      Peak Flow --      Pain Score 04/13/19 0934 5     Pain Loc --      Pain Edu? --      Excl. in [place]? --     No data found.  Updated Vital Signs BP 132/69   Pulse 63   Temp 97.9 F (36.6 C)   Resp 18   SpO2 94%   Visual Acuity Right Eye Distance:   Left Eye Distance:   Bilateral Distance:    Right Eye Near:   Left Eye Near:    Bilateral Near:     Physical Exam Constitutional:      General: She is not in acute distress.    Appearance: Normal appearance. She is normal weight. She is not  ill-appearing.  Cardiovascular:     Rate and Rhythm: Normal rate and regular rhythm.     Pulses: Normal pulses.     Heart sounds: Normal heart sounds. No murmur.  Pulmonary:     Effort: Pulmonary effort is normal. No respiratory distress.     Breath sounds: No stridor. No wheezing or rhonchi.  Chest:     Chest wall: No tenderness.  Abdominal:     General: Abdomen is flat. Bowel sounds are normal. There is no distension.     Palpations: Abdomen is soft. There is no mass.     Tenderness: There is no abdominal tenderness.     Hernia: No hernia is present.  Neurological:     Mental Status: She is alert.      UC Treatments / Results  Labs (all labs ordered are listed, but only abnormal results are displayed) Labs Reviewed  POCT URINALYSIS DIP (MANUAL ENTRY) - Abnormal; Notable for the following components:      Result Value   Color, UA orange (*)    Glucose, UA =100 (*)    Blood, UA moderate (*)    Nitrite, UA Positive (*)    Leukocytes, UA Large (3+) (*)    All other components within normal limits  URINE CULTURE    EKG   Radiology No results found.  Procedures Procedures (including critical care time)  Medications Ordered in UC Medications - No data to display  Initial Impression / Assessment and Plan / UC Course  I have reviewed the triage vital signs and the nursing notes.  Pertinent labs & imaging results that were available during my care of the patient were reviewed by me and considered in my medical decision making (see chart for details).   UA show positive  nitrates and in moderate amount of blood.  Urine sent for culture.  Patient stable for discharge.  Benign physical exam.  Bactrim DS, and Pyridium was prescribed.  Advised patient to follow-up with primary care or to return for symptom worsening.   Final Clinical Impressions(s) / UC Diagnoses   Final diagnoses:  Acute cystitis with hematuria     Discharge Instructions     Urine culture sent.  We will call you with the results.   Push fluids and get plenty of rest.   Take antibiotic as directed and to completion Take pyridium as prescribed and as needed for symptomatic relief Follow up with PCP if symptoms persists Return here or go to ER if you have any new or worsening symptoms such as fever, worsening abdominal pain, nausea/vomiting, flank pain     ED Prescriptions    Medication Sig Dispense Auth. Provider   sulfamethoxazole-trimethoprim (BACTRIM DS) 800-160 MG tablet Take 1 tablet by mouth 2 (two) times daily for 3 days. 6 tablet Stefan Karen, Darrelyn Hillock, FNP     PDMP not reviewed this encounter.   Emerson Monte, FNP 04/13/19 1031

## 2019-04-13 NOTE — Discharge Instructions (Addendum)
Urine culture sent.  We will call you with the results.   Push fluids and get plenty of rest.   Take antibiotic as directed and to completion Take pyridium as prescribed and as needed for symptomatic relief Follow up with PCP if symptoms persists Return here or go to ER if you have any new or worsening symptoms such as fever, worsening abdominal pain, nausea/vomiting, flank pain

## 2019-04-13 NOTE — ED Triage Notes (Signed)
Pt presents with c/o dysuria for past 2 days

## 2019-04-14 LAB — URINE CULTURE: Culture: NO GROWTH

## 2019-06-10 ENCOUNTER — Other Ambulatory Visit (HOSPITAL_COMMUNITY): Payer: Self-pay | Admitting: Family Medicine

## 2019-06-10 DIAGNOSIS — Z1231 Encounter for screening mammogram for malignant neoplasm of breast: Secondary | ICD-10-CM

## 2019-06-21 ENCOUNTER — Ambulatory Visit (HOSPITAL_COMMUNITY)
Admission: RE | Admit: 2019-06-21 | Discharge: 2019-06-21 | Disposition: A | Discharge status: Home / Self Care | Payer: Federal, State, Local not specified - PPO | Source: Ambulatory Visit | Attending: Family Medicine | Admitting: Family Medicine

## 2019-06-21 ENCOUNTER — Other Ambulatory Visit: Payer: Self-pay

## 2019-06-21 DIAGNOSIS — Z1231 Encounter for screening mammogram for malignant neoplasm of breast: Secondary | ICD-10-CM | POA: Diagnosis present

## 2019-06-25 ENCOUNTER — Ambulatory Visit (INDEPENDENT_AMBULATORY_CARE_PROVIDER_SITE_OTHER): Payer: Federal, State, Local not specified - PPO | Admitting: Internal Medicine

## 2019-06-25 ENCOUNTER — Other Ambulatory Visit: Payer: Self-pay

## 2019-06-25 ENCOUNTER — Other Ambulatory Visit (HOSPITAL_COMMUNITY): Payer: Self-pay | Admitting: Internal Medicine

## 2019-06-25 ENCOUNTER — Encounter (INDEPENDENT_AMBULATORY_CARE_PROVIDER_SITE_OTHER): Payer: Self-pay | Admitting: Internal Medicine

## 2019-06-25 VITALS — BP 130/79 | HR 65 | Temp 97.6°F | Ht 60.0 in | Wt 158.2 lb

## 2019-06-25 DIAGNOSIS — E039 Hypothyroidism, unspecified: Secondary | ICD-10-CM

## 2019-06-25 DIAGNOSIS — R41840 Attention and concentration deficit: Secondary | ICD-10-CM

## 2019-06-25 DIAGNOSIS — R7303 Prediabetes: Secondary | ICD-10-CM

## 2019-06-25 DIAGNOSIS — I10 Essential (primary) hypertension: Secondary | ICD-10-CM

## 2019-06-25 DIAGNOSIS — E559 Vitamin D deficiency, unspecified: Secondary | ICD-10-CM | POA: Diagnosis not present

## 2019-06-25 DIAGNOSIS — E282 Polycystic ovarian syndrome: Secondary | ICD-10-CM

## 2019-06-25 DIAGNOSIS — R928 Other abnormal and inconclusive findings on diagnostic imaging of breast: Secondary | ICD-10-CM

## 2019-06-25 DIAGNOSIS — N941 Unspecified dyspareunia: Secondary | ICD-10-CM | POA: Diagnosis not present

## 2019-06-25 NOTE — Patient Instructions (Signed)
[facility] Dietary Recommendations for Weight Loss What to Avoid . Avoid added sugars o Often added sugar can be found in processed foods such as many condiments, dry cereals, cakes, cookies, chips, crisps, crackers, candies, sweetened drinks, etc.  o Read labels and AVOID/DECREASE use of foods with the following in their ingredient list: Sugar, fructose, high fructose corn syrup, sucrose, glucose, maltose, dextrose, molasses, cane sugar, brown sugar, any type of syrup, agave nectar, etc.   . Avoid snacking in between meals . Avoid foods made with flour o If you are going to eat food made with flour, choose those made with whole-grains; and, minimize your consumption as much as is tolerable . Avoid processed foods o These foods are generally stocked in the middle of the grocery store. Focus on shopping on the perimeter of the grocery.  . Avoid Meat  o We recommend following a plant-based diet at [facility]. Thus, we recommend avoiding meat as a general rule. Consider eating beans, legumes, eggs, and/or dairy products for regular protein sources o If you plan on eating meat limit to 4 ounces of meat at a time and choose lean options such as Fish, chicken, turkey. Avoid red meat intake such as pork and/or steak What to Include . Vegetables o GREEN LEAFY VEGETABLES: Kale, spinach, mustard greens, collard greens, cabbage, broccoli, etc. o OTHER: Asparagus, cauliflower, eggplant, carrots, peas, Brussel sprouts, tomatoes, bell peppers, zucchini, beets, cucumbers, etc. . Grains, seeds, and legumes o Beans: kidney beans, black eyed peas, garbanzo beans, black beans, pinto beans, etc. o Whole, unrefined grains: brown rice, barley, bulgur, oatmeal, etc. . Healthy fats  o Avoid highly processed fats such as vegetable oil o Examples of healthy fats: avocado, olives, virgin olive oil, dark chocolate (?72% Cocoa), nuts (peanuts, almonds, walnuts, cashews, pecans, etc.) . None to Low  Intake of Animal Sources of Protein o Meat sources: chicken, turkey, salmon, tuna. Limit to 4 ounces of meat at one time. o Consider limiting dairy sources, but when choosing dairy focus on: PLAIN Greek yogurt, cottage cheese, high-protein milk . Fruit o Choose berries  When to Eat . Intermittent Fasting: o Choosing not to eat for a specific time period, but DO FOCUS ON HYDRATION when fasting o Multiple Techniques: - Time Restricted Eating: eat 3 meals in a day, each meal lasting no more than 60 minutes, no snacks between meals - 16-18 hour fast: fast for 16 to 18 hours up to 7 days a week. Often suggested to start with 2-3 nonconsecutive days per week.  . Remember the time you sleep is counted as fasting.  . Examples of eating schedule: Fast from 7:00pm-11:00am. Eat between 11:00am-7:00pm.  - 24-hour fast: fast for 24 hours up to every other day. Often suggested to start with 1 day per week . Remember the time you sleep is counted as fasting . Examples of eating schedule:  o Eating day: eat 2-3 meals on your eating day. If doing 2 meals, each meal should last no more than 90 minutes. If doing 3 meals, each meal should last no more than 60 minutes. Finish last meal by 7:00pm. o Fasting day: Fast until 7:00pm.  o IF YOU FEEL UNWELL FOR ANY REASON/IN ANY WAY WHEN FASTING, STOP FASTING BY EATING A NUTRITIOUS SNACK OR LIGHT MEAL o ALWAYS FOCUS ON HYDRATION DURING FASTS - Acceptable Hydration sources: water, broths, tea/coffee (black tea/coffee is best but using a small amount of whole-fat dairy products in coffee/tea is acceptable).  -   Poor Hydration Sources: anything with sugar or artificial sweeteners added to it  These recommendations have been developed for patients that are actively receiving medical care from either Mackenzie Mcpherson or Sarah Gray, DNP, NP-C at [facility]. These recommendations are developed for patients with specific medical conditions and are not meant to be  distributed or used by others that are not actively receiving care from either provider listed above at [facility]. It is not appropriate to participate in the above eating plans without proper medical supervision.   Reference: Fung, J. The obesity code. [place]: [place]; 2016.   

## 2019-06-25 NOTE — Progress Notes (Signed)
Metrics: Intervention Frequency ACO  Documented Smoking Status Yearly  Screened one or more times in 24 months  Cessation Counseling or  Active cessation medication Past 24 months  Past 24 months   Guideline developer: UpToDate (See UpToDate for funding source) Date Released: 2014       Wellness Office Visit  Subjective:  Patient ID: Mackenzie Mc[facility], female    DOB: 08-21-1950  Age: 69 y.o. MRN: [id number]  CC: This delightful 69 year old lady comes to our practice as a new patient to be established.  Her previous PCP has retired now.  HPI She has a history of hypothyroidism, gastroesophageal reflux disease, hypertension.  In the past, she has had elevated testosterone levels. She describes dyspareunia.  In the past, she had described decreased libido. She is compliant with antihypertensive medications and also levothyroxine for her hypothyroidism. She also takes vitamin D3 supplementation for vitamin D deficiency. Also she has been found to be prediabetic with a hemoglobin A1c of 6.3% in 2019. She tells me she had a cardiac event and it looks like this was vasospasm as she appeared to have normal coronary arteries.  This event was in January 2020.  Past Medical History:  Diagnosis Date  . Diverticulitis 2010  . GERD (gastroesophageal reflux disease)    errosive esophagitis in 2010  . Heart attack ([place])   . Hemorrhoids   . Hiatal hernia   . Hypertension   . Hypothyroidism   . Schatzki's ring    last dilation 2010      Family History  Problem Relation Age of Onset  . Hypertension Father   . Stroke Mother   . Diabetes Mother   . Colon cancer Brother 40  . Colon cancer Brother 36  . Prostate cancer Brother     Social History   Social History Narrative  . Not on file   Social History   Tobacco Use  . Smoking status: Never Smoker  . Smokeless tobacco: Never Used  Substance Use Topics  . Alcohol use: No    Alcohol/week: 0.0 standard drinks    Current Meds   Medication Sig  . aspirin EC 81 MG EC tablet Take 1 tablet (81 mg total) by mouth daily.  Marland Kitchen atorvastatin (LIPITOR) 40 MG tablet Take 1 tablet (40 mg total) by mouth daily at 6 PM.  . Cholecalciferol (VITAMIN D3) 5000 UNITS TABS Take 1 tablet by mouth daily.  . Ginger, Zingiber officinalis, (GINGER PO) Take 1 tablet by mouth daily.   Marland Kitchen levothyroxine (SYNTHROID, LEVOTHROID) 50 MCG tablet Take 50 mcg by mouth every morning.   Marland Kitchen losartan-hydrochlorothiazide (HYZAAR) 100-12.5 MG per tablet Take 1 tablet by mouth daily.    . metoprolol tartrate (LOPRESSOR) 25 MG tablet Take 0.5 tablets (12.5 mg total) by mouth 2 (two) times daily.  . Multiple Vitamins-Minerals (HAIR SKIN & NAILS ADVANCED PO) Take 1 tablet by mouth daily.  . Multiple Vitamins-Minerals (WOMENS MULTIVITAMIN PLUS) TABS Take 1 tablet by mouth daily.  . nitroGLYCERIN (NITROSTAT) 0.4 MG SL tablet Place 1 tablet (0.4 mg total) under the tongue every 5 (five) minutes x 3 doses as needed for chest pain.  . Probiotic Product (ALIGN) 4 MG CAPS Take 4 mg by mouth daily.  Marland Kitchen Propylene Glycol (SYSTANE BALANCE) 0.6 % SOLN Apply 1-2 tablets to eye daily as needed (for dry eye relief).  . TURMERIC PO Take 1 tablet by mouth daily.   . [DISCONTINUED] clopidogrel (PLAVIX) 75 MG tablet Take 1 tablet (75 mg total) by  mouth daily.      Objective:   Today's Vitals: BP 130/79 (BP Location: Left Arm, Patient Position: Sitting, Cuff Size: Normal)   Pulse 65   Temp 97.6 F (36.4 C) (Temporal)   Ht 5' (1.524 m)   Wt 158 lb 3.2 oz (71.8 kg)   SpO2 98%   BMI 30.90 kg/m  Vitals with BMI 06/25/2019 04/13/2019 05/11/2018  Height 5\' 0"  - -  Weight 158 lbs 3 oz - -  BMI 123456 - -  Systolic [id number]  Diastolic 79 69 56  Pulse 65 63 63     Physical Exam   She looks systemically well.  Blood pressure is reasonably well controlled for her age.  She is obese.    Assessment   1. Prediabetes   2. Essential hypertension, benign   3. Vitamin D  deficiency disease   4. Dyspareunia in female   5. Lack of concentration   6. PCOS (polycystic ovarian syndrome)   7. Hypothyroidism, adult       Tests ordered Orders Placed This Encounter  Procedures  . CBC  . COMPLETE METABOLIC PANEL WITH GFR  . Estradiol  . Follicle stimulating hormone  . Luteinizing hormone  . Hemoglobin A1c  . Testos,Total,Free and SHBG (Female)  . T3, free  . TSH  . VITAMIN D 25 Hydroxy (Vit-D Deficiency, Fractures)  . T4  . Progesterone     Plan: 1. Blood work is ordered above.  I told her she may have PCOS based on elevated testosterone levels in the past and we will evaluate this further now. 2. I gave her a diet sheet in terms of nutrition and we will discuss this on the next visit. 3. I will see her in about a month's time when all the blood work done today will be available and we can discuss further. 4. Today I spent 40 minutes with the patient reviewing her medical records and describing pathophysiology of PCOS, which I think she may have.   No orders of the defined types were placed in this encounter.   Doree Albee, MD

## 2019-06-29 LAB — HEMOGLOBIN A1C
Hgb A1c MFr Bld: 6.1 % of total Hgb — ABNORMAL HIGH (ref <5.7)
Mean Plasma Glucose: 128 (calc)
eAG (mmol/L): 7.1 (calc)

## 2019-06-29 LAB — FOLLICLE STIMULATING HORMONE: FSH: 31.1 m[IU]/mL

## 2019-06-29 LAB — TESTOS,TOTAL,FREE AND SHBG (FEMALE)
Free Testosterone: 16 pg/mL — ABNORMAL HIGH (ref 0.2–3.7)
Sex Hormone Binding: 33 nmol/L (ref 14–73)
Testosterone, Total, LC-MS-MS: 118 ng/dL — ABNORMAL HIGH (ref 2–45)

## 2019-06-29 LAB — COMPLETE METABOLIC PANEL WITH GFR
AG Ratio: 1.5 (calc) (ref 1.0–2.5)
ALT: 15 U/L (ref 6–29)
AST: 17 U/L (ref 10–35)
Albumin: 4 g/dL (ref 3.6–5.1)
Alkaline phosphatase (APISO): 52 U/L (ref 37–153)
BUN: 11 mg/dL (ref 7–25)
CO2: 29 mmol/L (ref 20–32)
Calcium: 9.2 mg/dL (ref 8.6–10.4)
Chloride: 106 mmol/L (ref 98–110)
Creat: 0.73 mg/dL (ref 0.60–0.93)
GFR, Est African American: 97 mL/min/{1.73_m2} (ref >=60)
GFR, Est Non African American: 83 mL/min/{1.73_m2} (ref >=60)
Globulin: 2.7 g/dL (calc) (ref 1.9–3.7)
Glucose, Bld: 99 mg/dL (ref 65–99)
Potassium: 4.1 mmol/L (ref 3.5–5.3)
Sodium: 140 mmol/L (ref 135–146)
Total Bilirubin: 0.3 mg/dL (ref 0.2–1.2)
Total Protein: 6.7 g/dL (ref 6.1–8.1)

## 2019-06-29 LAB — CBC
HCT: 39.8 % (ref 35.0–45.0)
Hemoglobin: 12.9 g/dL (ref 11.7–15.5)
MCH: 28.6 pg (ref 27.0–33.0)
MCHC: 32.4 g/dL (ref 32.0–36.0)
MCV: 88.2 fL (ref 80.0–100.0)
MPV: 10.9 fL (ref 7.5–12.5)
Platelets: 225 10*3/uL (ref 140–400)
RBC: 4.51 10*6/uL (ref 3.80–5.10)
RDW: 13.5 % (ref 11.0–15.0)
WBC: 5.1 10*3/uL (ref 3.8–10.8)

## 2019-06-29 LAB — T4: T4, Total: 10.2 ug/dL (ref 5.1–11.9)

## 2019-06-29 LAB — PROGESTERONE: Progesterone: <0.5 ng/mL

## 2019-06-29 LAB — LUTEINIZING HORMONE: LH: 18.2 m[IU]/mL

## 2019-06-29 LAB — VITAMIN D 25 HYDROXY (VIT D DEFICIENCY, FRACTURES): Vit D, 25-Hydroxy: 58 ng/mL (ref 30–100)

## 2019-06-29 LAB — TSH: TSH: 1.44 mIU/L (ref 0.40–4.50)

## 2019-06-29 LAB — T3, FREE: T3, Free: 3 pg/mL (ref 2.3–4.2)

## 2019-06-29 LAB — ESTRADIOL: Estradiol: 29 pg/mL

## 2019-07-02 ENCOUNTER — Other Ambulatory Visit: Payer: Self-pay

## 2019-07-02 ENCOUNTER — Ambulatory Visit (HOSPITAL_COMMUNITY)
Admission: RE | Admit: 2019-07-02 | Discharge: 2019-07-02 | Disposition: A | Discharge status: Home / Self Care | Payer: Federal, State, Local not specified - PPO | Source: Ambulatory Visit | Attending: Internal Medicine | Admitting: Internal Medicine

## 2019-07-02 DIAGNOSIS — R928 Other abnormal and inconclusive findings on diagnostic imaging of breast: Secondary | ICD-10-CM

## 2019-07-03 ENCOUNTER — Ambulatory Visit: Payer: Federal, State, Local not specified - PPO | Admitting: Family Medicine

## 2019-07-24 ENCOUNTER — Encounter (INDEPENDENT_AMBULATORY_CARE_PROVIDER_SITE_OTHER): Payer: Self-pay | Admitting: Internal Medicine

## 2019-07-24 ENCOUNTER — Ambulatory Visit (INDEPENDENT_AMBULATORY_CARE_PROVIDER_SITE_OTHER): Payer: Federal, State, Local not specified - PPO | Admitting: Internal Medicine

## 2019-07-24 ENCOUNTER — Other Ambulatory Visit: Payer: Self-pay

## 2019-07-24 VITALS — BP 140/70 | HR 82 | Temp 97.5°F | Ht 60.0 in | Wt 156.2 lb

## 2019-07-24 DIAGNOSIS — R7303 Prediabetes: Secondary | ICD-10-CM | POA: Diagnosis not present

## 2019-07-24 DIAGNOSIS — I10 Essential (primary) hypertension: Secondary | ICD-10-CM | POA: Diagnosis not present

## 2019-07-24 DIAGNOSIS — E282 Polycystic ovarian syndrome: Secondary | ICD-10-CM | POA: Diagnosis not present

## 2019-07-24 NOTE — Progress Notes (Signed)
Metrics: Intervention Frequency ACO  Documented Smoking Status Yearly  Screened one or more times in 24 months  Cessation Counseling or  Active cessation medication Past 24 months  Past 24 months   Guideline developer: UpToDate (See UpToDate for funding source) Date Released: 2014       Wellness Office Visit  Subjective:  Patient ID: [facility], female    DOB: Mar 08, 1951  Age: 69 y.o. MRN: [id number]  CC: This lady comes in to discuss her blood work and further recommendations. HPI  I discussed all her results with her.  Although her FSH/LH ratio is not confirmatory of PCOS, testosterone levels are significantly elevated and based on her symptoms when she was younger, she definitely appears to have PCOS.  I discussed this with her today.  I discussed the pathophysiology of PCOS. She is also prediabetic and we discussed this to her.  Since the last time I saw her, she has been trying to eat better and as a result has lost weight. She does describe decreased libido and vaginal dryness with dyspareunia. Past Medical History:  Diagnosis Date  . Diverticulitis 2010  . GERD (gastroesophageal reflux disease)    errosive esophagitis in 2010  . Heart attack ([place])   . Hemorrhoids   . Hiatal hernia   . Hypertension   . Hypothyroidism   . Schatzki's ring    last dilation 2010      Family History  Problem Relation Age of Onset  . Hypertension Father   . Stroke Mother   . Diabetes Mother   . Colon cancer Brother 40  . Colon cancer Brother 42  . Prostate cancer Brother     Social History   Social History Narrative  . Not on file   Social History   Tobacco Use  . Smoking status: Never Smoker  . Smokeless tobacco: Never Used  Substance Use Topics  . Alcohol use: No    Alcohol/week: 0.0 standard drinks    Current Meds  Medication Sig  . aspirin EC 81 MG EC tablet Take 1 tablet (81 mg total) by mouth daily.  Marland Kitchen atorvastatin (LIPITOR) 40 MG tablet Take 1 tablet  (40 mg total) by mouth daily at 6 PM.  . calcium carbonate (TUMS - DOSED IN MG ELEMENTAL CALCIUM) 500 MG chewable tablet Chew 1 tablet by mouth daily.  . Cholecalciferol (VITAMIN D3) 5000 UNITS TABS Take 1 tablet by mouth daily.  . Ginger, Zingiber officinalis, (GINGER PO) Take 1 tablet by mouth daily.   Marland Kitchen levothyroxine (SYNTHROID, LEVOTHROID) 50 MCG tablet Take 50 mcg by mouth every morning.   Marland Kitchen losartan-hydrochlorothiazide (HYZAAR) 100-12.5 MG per tablet Take 1 tablet by mouth daily.    . metoprolol tartrate (LOPRESSOR) 25 MG tablet Take 0.5 tablets (12.5 mg total) by mouth 2 (two) times daily.  . Multiple Vitamins-Minerals (HAIR SKIN & NAILS ADVANCED PO) Take 1 tablet by mouth daily.  . Multiple Vitamins-Minerals (WOMENS MULTIVITAMIN PLUS) TABS Take 1 tablet by mouth daily.  . nitroGLYCERIN (NITROSTAT) 0.4 MG SL tablet Place 1 tablet (0.4 mg total) under the tongue every 5 (five) minutes x 3 doses as needed for chest pain.  . Probiotic Product (ALIGN) 4 MG CAPS Take 4 mg by mouth daily.  Marland Kitchen Propylene Glycol (SYSTANE BALANCE) 0.6 % SOLN Apply 1-2 tablets to eye daily as needed (for dry eye relief).  . TURMERIC PO Take 1 tablet by mouth daily.        Objective:   Today's Vitals: BP  140/70 (BP Location: Left Arm, Patient Position: Sitting, Cuff Size: Normal)   Pulse 82   Temp (!) 97.5 F (36.4 C) (Temporal)   Ht 5' (1.524 m)   Wt 156 lb 3.2 oz (70.9 kg)   SpO2 97%   BMI 30.51 kg/m  Vitals with BMI 07/24/2019 06/25/2019 04/13/2019  Height 5\' 0"  5\' 0"  -  Weight 156 lbs 3 oz 158 lbs 3 oz -  BMI 123XX123 123456 -  Systolic [id number]  Diastolic 70 79 69  Pulse 82 65 63     Physical Exam  She looks systemically well.  She has lost 2 pounds since her last visit but remains obese.  Blood pressure is well controlled.     Assessment   1. Prediabetes   2. PCOS (polycystic ovarian syndrome)   3. Essential hypertension, benign       Tests ordered No orders of the defined types were  placed in this encounter.    Plan: 1. I discussed all the results in detail. 2. Today in addition to the diet sheet I gave her, I discussed the concept of intermittent fasting and spent time discussing the pathophysiology of this.  She may try to do this.  I told her that she must be hydrated if she does fast especially for prolonged periods of time. 3. We also briefly discussed identical hormone therapy and I gave her simple [email or web address] website to look at several videos regarding this.  I think she would benefit initially from progesterone.  She is clearly in the menopause. 4. Follow-up in 6 weeks. 5. Today I spent 40 minutes discussing all her results in detail and as well as nutrition again.  Okay   No orders of the defined types were placed in this encounter.   Doree Albee, MD

## 2019-09-04 ENCOUNTER — Encounter (INDEPENDENT_AMBULATORY_CARE_PROVIDER_SITE_OTHER): Payer: Self-pay | Admitting: Internal Medicine

## 2019-09-04 ENCOUNTER — Ambulatory Visit (INDEPENDENT_AMBULATORY_CARE_PROVIDER_SITE_OTHER): Payer: Federal, State, Local not specified - PPO | Admitting: Internal Medicine

## 2019-09-04 ENCOUNTER — Other Ambulatory Visit: Payer: Self-pay

## 2019-09-04 VITALS — BP 120/76 | HR 69 | Temp 97.5°F | Resp 18 | Ht 61.0 in | Wt 152.8 lb

## 2019-09-04 DIAGNOSIS — E039 Hypothyroidism, unspecified: Secondary | ICD-10-CM

## 2019-09-04 DIAGNOSIS — E282 Polycystic ovarian syndrome: Secondary | ICD-10-CM | POA: Diagnosis not present

## 2019-09-04 DIAGNOSIS — I10 Essential (primary) hypertension: Secondary | ICD-10-CM | POA: Diagnosis not present

## 2019-09-04 DIAGNOSIS — R7303 Prediabetes: Secondary | ICD-10-CM

## 2019-09-04 NOTE — Progress Notes (Signed)
Metrics: Intervention Frequency ACO  Documented Smoking Status Yearly  Screened one or more times in 24 months  Cessation Counseling or  Active cessation medication Past 24 months  Past 24 months   Guideline developer: UpToDate (See UpToDate for funding source) Date Released: 2014       Wellness Office Visit  Subjective:  Patient ID: [facility] Mcpher[facility], female    DOB: 1950-10-02  Age: 69 y.o. MRN: [id number]  CC: This lady comes in for follow-up regarding her obesity, PCOS, hypothyroidism, hypertension.  She also has prediabetes. HPI  Since the last visit, she has begun to make changes on her diet and is eating better now.  She regularly does intermittent fasting usually 16 hours every day.  As a result, she has lost weight. She walks her dog almost on a daily basis and this is the extent of her exercise. She continues to take levothyroxine. She also describes what I believe her hot flashes every night. Past Medical History:  Diagnosis Date  . Diverticulitis 2010  . GERD (gastroesophageal reflux disease)    errosive esophagitis in 2010  . Heart attack ([place])   . Hemorrhoids   . Hiatal hernia   . Hypertension   . Hypothyroidism   . Schatzki's ring    last dilation 2010      Family History  Problem Relation Age of Onset  . Hypertension Father   . Stroke Mother   . Diabetes Mother   . Colon cancer Brother 45  . Colon cancer Brother 2  . Prostate cancer Brother     Social History   Social History Narrative  . Not on file   Social History   Tobacco Use  . Smoking status: Never Smoker  . Smokeless tobacco: Never Used  Substance Use Topics  . Alcohol use: No    Alcohol/week: 0.0 standard drinks    Current Meds  Medication Sig  . aspirin EC 81 MG EC tablet Take 1 tablet (81 mg total) by mouth daily.  Marland Kitchen atorvastatin (LIPITOR) 40 MG tablet Take 1 tablet (40 mg total) by mouth daily at 6 PM.  . calcium carbonate (TUMS - DOSED IN MG ELEMENTAL CALCIUM) 500 MG  chewable tablet Chew 1 tablet by mouth daily.  . Cholecalciferol (VITAMIN D3) 5000 UNITS TABS Take 1 tablet by mouth daily.  . Ginger, Zingiber officinalis, (GINGER PO) Take 1 tablet by mouth daily.   Marland Kitchen levothyroxine (SYNTHROID, LEVOTHROID) 50 MCG tablet Take 50 mcg by mouth every morning.   Marland Kitchen losartan-hydrochlorothiazide (HYZAAR) 100-12.5 MG per tablet Take 1 tablet by mouth daily.    . metoprolol tartrate (LOPRESSOR) 25 MG tablet Take 0.5 tablets (12.5 mg total) by mouth 2 (two) times daily.  . Multiple Vitamins-Minerals (HAIR SKIN & NAILS ADVANCED PO) Take 1 tablet by mouth daily.  . Multiple Vitamins-Minerals (WOMENS MULTIVITAMIN PLUS) TABS Take 1 tablet by mouth daily.  . nitroGLYCERIN (NITROSTAT) 0.4 MG SL tablet Place 1 tablet (0.4 mg total) under the tongue every 5 (five) minutes x 3 doses as needed for chest pain.  . Probiotic Product (ALIGN) 4 MG CAPS Take 4 mg by mouth daily.  Marland Kitchen Propylene Glycol (SYSTANE BALANCE) 0.6 % SOLN Apply 1-2 tablets to eye daily as needed (for dry eye relief).  . TURMERIC PO Take 1 tablet by mouth daily.       Objective:   Today's Vitals: BP 120/76 (BP Location: Left Arm, Patient Position: Sitting, Cuff Size: Small)   Pulse 69   Temp Marland Kitchen)  97.5 F (36.4 C) (Temporal)   Resp 18   Ht 5\' 1"  (1.549 m)   Wt 152 lb 12.8 oz (69.3 kg)   SpO2 98%   BMI 28.87 kg/m  Vitals with BMI 09/04/2019 07/24/2019 06/25/2019  Height 5\' 1"  5\' 0"  5\' 0"   Weight 152 lbs 13 oz 156 lbs 3 oz 158 lbs 3 oz  BMI 28.89 123XX123 123456  Systolic 123456 [id number]  Diastolic 76 70 79  Pulse 69 82 65     Physical Exam Looks systemically well.  She has lost 4 pounds since the last time I saw her.  Her blood pressure is also improved.      Assessment   1. Prediabetes   2. PCOS (polycystic ovarian syndrome)   3. Essential hypertension, benign   4. Hypothyroidism, adult       Tests ordered No orders of the defined types were placed in this encounter.    Plan: 1. I  congratulated her on her weight loss so far.  She will continue to persevere with this plan. 2. Today we discussed her hypothyroidism in more detail.  I explained the thyroid hormones involved and we agreed together that she will change her levothyroxine to NP thyroid.  I have given her samples and she will start with NP thyroid 30 mg daily and if she tolerates this, after a week, she will move onto a dose of NP thyroid 60 mg every day. 3. I will see her for close follow-up in a month's time and see how she is doing. 4. Today I spent 30 minutes with this patient discussing all of the above.   No orders of the defined types were placed in this encounter.   Doree Albee, MD

## 2019-09-04 NOTE — Patient Instructions (Signed)
CONCEPT 2 ROWER MODEL D  [employer]

## 2019-09-25 ENCOUNTER — Other Ambulatory Visit (INDEPENDENT_AMBULATORY_CARE_PROVIDER_SITE_OTHER): Payer: Self-pay

## 2019-09-25 MED ORDER — ROSUVASTATIN CALCIUM 40 MG PO TABS: 40.0000 mg | ORAL_TABLET | Freq: Every day | ORAL | 1 refills | Status: DC

## 2019-09-26 ENCOUNTER — Ambulatory Visit: Payer: Federal, State, Local not specified - PPO | Admitting: Orthopedic Surgery

## 2019-09-26 ENCOUNTER — Other Ambulatory Visit: Payer: Self-pay

## 2019-09-26 ENCOUNTER — Encounter: Payer: Self-pay | Admitting: Orthopedic Surgery

## 2019-09-26 VITALS — BP 133/77 | HR 85 | Ht 60.0 in | Wt 153.2 lb

## 2019-09-26 DIAGNOSIS — M65332 Trigger finger, left middle finger: Secondary | ICD-10-CM

## 2019-09-26 DIAGNOSIS — M65331 Trigger finger, right middle finger: Secondary | ICD-10-CM

## 2019-09-26 NOTE — Progress Notes (Signed)
Chief Complaint  Patient presents with  . Hand Pain    Bilateral/middle fingers are stiff feeling   History:  69 year old female presents with locking right and left long finger for couple of months.  No trauma.  No risk factors except for thyroid disease  Review of systems no numbness or tingling in the hand no weakness does complain of stiffness of the joints of the hand  She was treating herself with some over-the-counter topical medications decided against Voltaren gel because of the heart attack she had last year  Past Medical History:  Diagnosis Date  . Diverticulitis 2010  . GERD (gastroesophageal reflux disease)    errosive esophagitis in 2010  . Heart attack ([place])   . Hemorrhoids   . Hiatal hernia   . Hypertension   . Hypothyroidism   . Schatzki's ring    last dilation 2010   Past Surgical History:  Procedure Laterality Date  . ABDOMINAL HYSTERECTOMY  1986   w/ unilateral salpingoopherectomy (can't remember which one)  . APPENDECTOMY    . CHOLECYSTECTOMY    . COLONOSCOPY  11/19/2004   Normal rectum/Normal colon  . COLONOSCOPY  12/11/09   [place]-minimally friable anal canal/hemorrhoids otherwise normal  . COLONOSCOPY N/A 02/11/2015   Procedure: COLONOSCOPY;  Surgeon: Daneil Dolin, MD;  Location: [facility];  Service: Endoscopy;  Laterality: N/A;  1100 - moved to 10:30 - Candy notified pt  . ESOPHAGOGASTRODUODENOSCOPY  08/15/2008   Moderate size hiatal hernia/Noncritical Schatzki's ring with superimposed distal esophageal erosion consistent with erosive reflux esophagitis  . ESOPHAGOGASTRODUODENOSCOPY N/A 06/11/2014   [id number] ring dilated/HH  . LEFT HEART CATH AND CORONARY ANGIOGRAPHY N/A 05/11/2018   Procedure: LEFT HEART CATH AND CORONARY ANGIOGRAPHY;  Surgeon: Dixie Dials, MD;  Location: [facility];  Service: Cardiovascular;  Laterality: N/A;   BP 133/77   Pulse 85   Ht 5' (1.524 m)   Wt 153 lb 4 oz (69.5 kg)   BMI 29.93 kg/m   Normal  development grooming and hygiene  Right hand tenderness over the A1 pulley of the long finger no catching locking flexor tendons intact color capillary refill and sensation normal good pulse and perfusion of the hand  Left hand again tenderness over the A1 pulley of the finger the long finger no catching or locking flexor tendons are intact color capillary refill sensation normal good pulse and perfusion noted  Trigger finger injection  Diagnosis  Right long finger  Procedure injection A1 pulley Medications lidocaine 1% 1 mL and Depo-Medrol 40 mg 1 mL Skin prep alcohol and ethyl chloride Verbal consent was obtained Timeout confirmed the injection site  After cleaning the skin with alcohol and anesthetizing the skin with ethyl chloride the A1 pulley was palpated and the injection was performed without complication   Trigger finger injection  Diagnosis  Left long finger  Procedure injection A1 pulley Medications lidocaine 1% 1 mL and Depo-Medrol 40 mg 1 mL Skin prep alcohol and ethyl chloride Verbal consent was obtained Timeout confirmed the injection site  After cleaning the skin with alcohol and anesthetizing the skin with ethyl chloride the A1 pulley was palpated and the injection was performed without complication   Encounter Diagnoses  Name Primary?  . Trigger finger, right middle finger Yes  . Trigger finger, left middle finger     Fu prn

## 2019-09-26 NOTE — Patient Instructions (Addendum)
You have received an injection of steroids into the joint. 15% of patients will have increased pain within the 24 hours postinjection.   This is transient and will go away.   We recommend that you use ice packs on the injection site for 20 minutes every 2 hours and extra strength Tylenol 2 tablets every 8 as needed until the pain resolves.  If you continue to have pain after taking the Tylenol and using the ice please call the office for further instructions.  Trigger Finger  Trigger finger, also called stenosing tenosynovitis,  is a condition that causes a finger to get stuck in a bent position. Each finger has a tendon, which is a tough, cord-like tissue that connects muscle to bone, and each tendon passes through a tunnel of tissue called a tendon sheath. To move your finger, your tendon needs to glide freely through the sheath. Trigger finger happens when the tendon or the sheath thickens, making it difficult to move your finger. Trigger finger can affect any finger or a thumb. It may affect more than one finger. Mild cases may clear up with rest and medicine. Severe cases require more treatment. What are the causes? Trigger finger is caused by a thickened finger tendon or tendon sheath. The cause of this thickening is not known. What increases the risk? The following factors may make you more likely to develop this condition:  Doing activities that require a strong grip.  Having rheumatoid arthritis, gout, or diabetes.  Being 40-60 years old.  Being female. What are the signs or symptoms? Symptoms of this condition include:  Pain when bending or straightening your finger.  Tenderness or swelling where your finger attaches to the palm of your hand.  A lump in the palm of your hand or on the inside of your finger.  Hearing a noise like a pop or a snap when you try to straighten your finger.  Feeling a catching or locking sensation when you try to straighten your finger.  Being  unable to straighten your finger. How is this diagnosed? This condition is diagnosed based on your symptoms and a physical exam. How is this treated? This condition may be treated by:  Resting your finger and avoiding activities that make symptoms worse.  Wearing a finger splint to keep your finger extended.  Taking NSAIDs, such as ibuprofen, to relieve pain and swelling.  Doing gentle exercises to stretch the finger as told by your health care provider.  Having medicine that reduces swelling and inflammation (steroids) injected into the tendon sheath. Injections may need to be repeated.  Having surgery to open the tendon sheath. This may be done if other treatments do not work and you cannot straighten your finger. You may need physical therapy after surgery. Follow these instructions at home: If you have a splint:  Wear the splint as told by your health care provider. Remove it only as told by your health care provider.  Loosen it if your fingers tingle, become numb, or turn cold and blue.  Keep it clean.  If the splint is not waterproof: ? Do not let it get wet. ? Cover it with a watertight covering when you take a bath or shower. Managing pain, stiffness, and swelling     If directed, apply heat to the affected area as often as told by your health care provider. Use the heat source that your health care provider recommends, such as a moist heat pack or a heating pad.  Place   a towel between your skin and the heat source.  Leave the heat on for 20-30 minutes.  Remove the heat if your skin turns bright red. This is especially important if you are unable to feel pain, heat, or cold. You may have a greater risk of getting burned. If directed, put ice on the painful area. To do this:  If you have a removable splint, remove it as told by your health care provider.  Put ice in a plastic bag.  Place a towel between your skin and the bag or between your splint and the  bag.  Leave the ice on for 20 minutes, 2-3 times a day.  Activity  Rest your finger as told by your health care provider. Avoid activities that make the pain worse.  Return to your normal activities as told by your health care provider. Ask your health care provider what activities are safe for you.  Do exercises as told by your health care provider.  Ask your health care provider when it is safe to drive if you have a splint on your hand. General instructions  Take over-the-counter and prescription medicines only as told by your health care provider.  Keep all follow-up visits as told by your health care provider. This is important. Contact a health care provider if:  Your symptoms are not improving with home care. Summary  Trigger finger, also called stenosing tenosynovitis, causes your finger to get stuck in a bent position. This can make it difficult and painful to straighten your finger.  This condition develops when a finger tendon or tendon sheath thickens.  Treatment may include resting your finger, wearing a splint, and taking medicines.  In severe cases, surgery to open the tendon sheath may be needed. This information is not intended to replace advice given to you by your health care provider. Make sure you discuss any questions you have with your health care provider. Document Revised: 08/27/2018 Document Reviewed: 08/27/2018 Elsevier Patient Education  2020 Else[employer] Inc.  

## 2019-10-07 ENCOUNTER — Encounter (INDEPENDENT_AMBULATORY_CARE_PROVIDER_SITE_OTHER): Payer: Self-pay | Admitting: Internal Medicine

## 2019-10-07 ENCOUNTER — Other Ambulatory Visit: Payer: Self-pay

## 2019-10-07 ENCOUNTER — Ambulatory Visit (INDEPENDENT_AMBULATORY_CARE_PROVIDER_SITE_OTHER): Payer: Federal, State, Local not specified - PPO | Admitting: Internal Medicine

## 2019-10-07 VITALS — BP 130/80 | HR 56 | Temp 97.0°F | Resp 18 | Ht 60.0 in | Wt 152.0 lb

## 2019-10-07 DIAGNOSIS — E039 Hypothyroidism, unspecified: Secondary | ICD-10-CM

## 2019-10-07 DIAGNOSIS — N941 Unspecified dyspareunia: Secondary | ICD-10-CM

## 2019-10-07 DIAGNOSIS — E282 Polycystic ovarian syndrome: Secondary | ICD-10-CM

## 2019-10-07 DIAGNOSIS — N951 Menopausal and female climacteric states: Secondary | ICD-10-CM

## 2019-10-07 DIAGNOSIS — R7303 Prediabetes: Secondary | ICD-10-CM

## 2019-10-07 DIAGNOSIS — I10 Essential (primary) hypertension: Secondary | ICD-10-CM

## 2019-10-07 LAB — T3, FREE: T3, Free: 3 pg/mL (ref 2.3–4.2)

## 2019-10-07 MED ORDER — ESTRADIOL 0.5 MG PO TABS: 0.5000 mg | ORAL_TABLET | Freq: Every day | ORAL | 3 refills | Status: DC

## 2019-10-07 MED ORDER — PROGESTERONE MICRONIZED 100 MG PO CAPS: 100.0000 mg | ORAL_CAPSULE | Freq: Every day | ORAL | 3 refills | Status: DC

## 2019-10-07 NOTE — Patient Instructions (Signed)
[facility] Dietary Recommendations for Weight Loss What to Avoid . Avoid added sugars o Often added sugar can be found in processed foods such as many condiments, dry cereals, cakes, cookies, chips, crisps, crackers, candies, sweetened drinks, etc.  o Read labels and AVOID/DECREASE use of foods with the following in their ingredient list: Sugar, fructose, high fructose corn syrup, sucrose, glucose, maltose, dextrose, molasses, cane sugar, brown sugar, any type of syrup, agave nectar, etc.   . Avoid snacking in between meals . Avoid foods made with flour o If you are going to eat food made with flour, choose those made with whole-grains; and, minimize your consumption as much as is tolerable . Avoid processed foods o These foods are generally stocked in the middle of the grocery store. Focus on shopping on the perimeter of the grocery.  . Avoid Meat  o We recommend following a plant-based diet at [facility]. Thus, we recommend avoiding meat as a general rule. Consider eating beans, legumes, eggs, and/or dairy products for regular protein sources o If you plan on eating meat limit to 4 ounces of meat at a time and choose lean options such as Fish, chicken, turkey. Avoid red meat intake such as pork and/or steak What to Include . Vegetables o GREEN LEAFY VEGETABLES: Kale, spinach, mustard greens, collard greens, cabbage, broccoli, etc. o OTHER: Asparagus, cauliflower, eggplant, carrots, peas, Brussel sprouts, tomatoes, bell peppers, zucchini, beets, cucumbers, etc. . Grains, seeds, and legumes o Beans: kidney beans, black eyed peas, garbanzo beans, black beans, pinto beans, etc. o Whole, unrefined grains: brown rice, barley, bulgur, oatmeal, etc. . Healthy fats  o Avoid highly processed fats such as vegetable oil o Examples of healthy fats: avocado, olives, virgin olive oil, dark chocolate (?72% Cocoa), nuts (peanuts, almonds, walnuts, cashews, pecans, etc.) . None to Low  Intake of Animal Sources of Protein o Meat sources: chicken, turkey, salmon, tuna. Limit to 4 ounces of meat at one time. o Consider limiting dairy sources, but when choosing dairy focus on: PLAIN Greek yogurt, cottage cheese, high-protein milk . Fruit o Choose berries  When to Eat . Intermittent Fasting: o Choosing not to eat for a specific time period, but DO FOCUS ON HYDRATION when fasting o Multiple Techniques: - Time Restricted Eating: eat 3 meals in a day, each meal lasting no more than 60 minutes, no snacks between meals - 16-18 hour fast: fast for 16 to 18 hours up to 7 days a week. Often suggested to start with 2-3 nonconsecutive days per week.  . Remember the time you sleep is counted as fasting.  . Examples of eating schedule: Fast from 7:00pm-11:00am. Eat between 11:00am-7:00pm.  - 24-hour fast: fast for 24 hours up to every other day. Often suggested to start with 1 day per week . Remember the time you sleep is counted as fasting . Examples of eating schedule:  o Eating day: eat 2-3 meals on your eating day. If doing 2 meals, each meal should last no more than 90 minutes. If doing 3 meals, each meal should last no more than 60 minutes. Finish last meal by 7:00pm. o Fasting day: Fast until 7:00pm.  o IF YOU FEEL UNWELL FOR ANY REASON/IN ANY WAY WHEN FASTING, STOP FASTING BY EATING A NUTRITIOUS SNACK OR LIGHT MEAL o ALWAYS FOCUS ON HYDRATION DURING FASTS - Acceptable Hydration sources: water, broths, tea/coffee (black tea/coffee is best but using a small amount of whole-fat dairy products in coffee/tea is acceptable).  -   Poor Hydration Sources: anything with sugar or artificial sweeteners added to it  These recommendations have been developed for patients that are actively receiving medical care from either Dr. Marche Hottenstein or Sarah Gray, DNP, NP-C at [facility]. These recommendations are developed for patients with specific medical conditions and are not meant to be  distributed or used by others that are not actively receiving care from either provider listed above at [facility]. It is not appropriate to participate in the above eating plans without proper medical supervision.   Reference: Fung, J. The obesity code. [place]: [place]; 2016.   

## 2019-10-07 NOTE — Progress Notes (Signed)
Metrics: Intervention Frequency ACO  Documented Smoking Status Yearly  Screened one or more times in 24 months  Cessation Counseling or  Active cessation medication Past 24 months  Past 24 months   Guideline developer: UpToDate (See UpToDate for funding source) Date Released: 2014       Wellness Office Visit  Subjective:  Patient ID: Mackenzie Mcpherson, female    DOB: Jun 02, 1950  Age: 69 y.o. MRN: [id number]  CC: This lady comes in for follow-up regarding her hypothyroidism, hypertension, PCOS. HPI  I gave her samples of NP thyroid 30 mg tablets and she has tolerated this well.  Unfortunately, she did not increase it to a dose of NP thyroid 60 mg as I had instructed if she felt well.  Nonetheless, she has tolerated the low-dose. She is trying to do better with diet and got off track a little bit in the last week or 2. She also describes night sweats and hot flashes which she has had for years.  She was taking hormones before but these were discontinued for some reason.  She was told that she had had the hormones for long enough. I see that her testosterone levels were elevated which would be in keeping with diagnosis of PCOS. Past Medical History:  Diagnosis Date  . Diverticulitis 2010  . GERD (gastroesophageal reflux disease)    errosive esophagitis in 2010  . Heart attack ([place])   . Hemorrhoids   . Hiatal hernia   . Hypertension   . Hypothyroidism   . Schatzki's ring    last dilation 2010   Past Surgical History:  Procedure Laterality Date  . ABDOMINAL HYSTERECTOMY  1986   w/ unilateral salpingoopherectomy (can't remember which one)  . APPENDECTOMY    . CHOLECYSTECTOMY    . COLONOSCOPY  11/19/2004   Normal rectum/Normal colon  . COLONOSCOPY  12/11/09   Rourk-minimally friable anal canal/hemorrhoids otherwise normal  . COLONOSCOPY N/A 02/11/2015   Procedure: COLONOSCOPY;  Surgeon: Daneil Dolin, MD;  [facility];  Service: Endoscopy;  Laterality: N/A;  1100  - moved to 10:30 - Candy notified pt  . ESOPHAGOGASTRODUODENOSCOPY  08/15/2008   Moderate size hiatal hernia/Noncritical Schatzki's ring with superimposed distal esophageal erosion consistent with erosive reflux esophagitis  . ESOPHAGOGASTRODUODENOSCOPY N/A 06/11/2014   [id number] ring dilated/HH  . LEFT HEART CATH AND CORONARY ANGIOGRAPHY N/A 05/11/2018   Procedure: LEFT HEART CATH AND CORONARY ANGIOGRAPHY;  Surgeon: Dixie Dials, MD;  Location: [place] [facility];  Service: Cardiovascular;  Laterality: N/A;     Family History  Problem Relation Age of Onset  . Hypertension Father   . Stroke Mother   . Diabetes Mother   . Colon cancer Brother 69  . Colon cancer Brother 55  . Prostate cancer Brother     Social History   Social History Narrative  . Not on file   Social History   Tobacco Use  . Smoking status: Never Smoker  . Smokeless tobacco: Never Used  Substance Use Topics  . Alcohol use: No    Alcohol/week: 0.0 standard drinks    Current Meds  Medication Sig  . aspirin EC 81 MG EC tablet Take 1 tablet (81 mg total) by mouth daily.  Marland Kitchen atorvastatin (LIPITOR) 40 MG tablet Take 1 tablet (40 mg total) by mouth daily at 6 PM.  . calcium carbonate (TUMS - DOSED IN MG ELEMENTAL CALCIUM) 500 MG chewable tablet Chew 1 tablet by mouth daily.  . Cholecalciferol (VITAMIN D3) 5000  UNITS TABS Take 1 tablet by mouth daily.  . Ginger, Zingiber officinalis, (GINGER PO) Take 1 tablet by mouth daily.   Marland Kitchen levothyroxine (SYNTHROID, LEVOTHROID) 50 MCG tablet Take 50 mcg by mouth every morning.   Marland Kitchen losartan-hydrochlorothiazide (HYZAAR) 100-12.5 MG per tablet Take 1 tablet by mouth daily.    . metoprolol tartrate (LOPRESSOR) 25 MG tablet Take 0.5 tablets (12.5 mg total) by mouth 2 (two) times daily.  . Multiple Vitamins-Minerals (HAIR SKIN & NAILS ADVANCED PO) Take 1 tablet by mouth daily.  . Multiple Vitamins-Minerals (WOMENS MULTIVITAMIN PLUS) TABS Take 1 tablet by mouth daily.  .  nitroGLYCERIN (NITROSTAT) 0.4 MG SL tablet Place 1 tablet (0.4 mg total) under the tongue every 5 (five) minutes x 3 doses as needed for chest pain.  . Probiotic Product (ALIGN) 4 MG CAPS Take 4 mg by mouth daily.  Marland Kitchen Propylene Glycol (SYSTANE BALANCE) 0.6 % SOLN Apply 1-2 tablets to eye daily as needed (for dry eye relief).  . rosuvastatin (CRESTOR) 40 MG tablet Take 1 tablet (40 mg total) by mouth daily.  . TURMERIC PO Take 1 tablet by mouth daily.       Depression screen [facility] 2/9 07/24/2019 06/25/2019  Decreased Interest 0 0  Down, Depressed, Hopeless 0 0  PHQ - 2 Score 0 0     Objective:   Today's Vitals: BP 130/80 (BP Location: Left Arm, Patient Position: Sitting, Cuff Size: Small)   Pulse (!) 56   Temp (!) 97 F (36.1 C) (Temporal)   Resp 18   Ht 5' (1.524 m)   Wt 152 lb (68.9 kg)   SpO2 98%   BMI 29.69 kg/m  Vitals with BMI 10/07/2019 09/26/2019 09/04/2019  Height 5\' 0"  5\' 0"  5\' 1"   Weight 152 lbs 153 lbs 4 oz 152 lbs 13 oz  BMI 29.69 43.15 40.08  Systolic 676 195 093  Diastolic 80 77 76  Pulse 56 85 69     Physical Exam   She looks systemically well.  She has lost 1 further pound since the last visit.  Blood pressure is well controlled.    Assessment   1. Hypothyroidism, adult   2. Essential hypertension, benign   3. Prediabetes   4. PCOS (polycystic ovarian syndrome)   5. Dyspareunia in female   6. Hot flashes due to menopause       Tests ordered Orders Placed This Encounter  Procedures  . T3, free     Plan: 1. We will check a T3 level and we may need to further escalate the dose of NP thyroid. 2. We also discussed her menopausal symptoms and after discussion regarding bioidentical hormone therapy and the difference between synthetic and bioidentical hormones, she is agreeable to try this.  I explained possible side effects of estradiol and progesterone and I have sent a prescription to her pharmacy. 3. I will see her in about 4 weeks for close  follow-up to see how she is doing and she realizes she needs to get back on track focusing on nutrition as we had discussed before.  Today, I also emphasized the importance of a plant-based diet together with intermittent fasting that she is already doing.   Meds ordered this encounter  Medications  . estradiol (ESTRACE) 0.5 MG tablet    Sig: Take 1 tablet (0.5 mg total) by mouth daily.    Dispense:  30 tablet    Refill:  3  . progesterone (PROMETRIUM) 100 MG capsule    Sig:  Take 1 capsule (100 mg total) by mouth daily.    Dispense:  30 capsule    Refill:  3    Asriel Westrup Luther Parody, MD

## 2019-10-09 ENCOUNTER — Other Ambulatory Visit (INDEPENDENT_AMBULATORY_CARE_PROVIDER_SITE_OTHER): Payer: Self-pay | Admitting: Internal Medicine

## 2019-10-09 MED ORDER — THYROID 60 MG PO TABS: 60.0000 mg | ORAL_TABLET | Freq: Every day | ORAL | 3 refills | Status: DC

## 2019-11-02 ENCOUNTER — Encounter: Payer: Self-pay | Admitting: Emergency Medicine

## 2019-11-02 ENCOUNTER — Other Ambulatory Visit: Payer: Self-pay

## 2019-11-02 ENCOUNTER — Ambulatory Visit
Admission: EM | Admit: 2019-11-02 | Discharge: 2019-11-02 | Disposition: A | Discharge status: Home / Self Care | Payer: Federal, State, Local not specified - PPO | Attending: Emergency Medicine | Admitting: Emergency Medicine

## 2019-11-02 DIAGNOSIS — R300 Dysuria: Secondary | ICD-10-CM | POA: Diagnosis present

## 2019-11-02 LAB — POCT URINALYSIS DIP (MANUAL ENTRY)
Bilirubin, UA: NEGATIVE
Glucose, UA: 100 mg/dL — AB
Ketones, POC UA: NEGATIVE mg/dL
Nitrite, UA: POSITIVE — AB
Protein Ur, POC: NEGATIVE mg/dL
Spec Grav, UA: 1.01 (ref 1.010–1.025)
Urobilinogen, UA: 1 E.U./dL
pH, UA: 5 (ref 5.0–8.0)

## 2019-11-02 MED ORDER — CEPHALEXIN 500 MG PO CAPS: 500.0000 mg | ORAL_CAPSULE | Freq: Two times a day (BID) | ORAL | 0 refills | Status: DC

## 2019-11-02 NOTE — ED Provider Notes (Signed)
[facility]   CC: Burning with urination  SUBJECTIVE:  Mackenzie Mcpherson is a 69 y.o. female who complains of urinary urgency and dysuria x 1 day.  Admits to decreased water intake.  Denies abdominal or flank pain.  Has tried OTC medications without relief.  Took AZO.  Symptoms are made worse with urination.  Admits to similar symptoms in the past.  Denies fever, chills, nausea, vomiting, abdominal pain, flank pain, abnormal vaginal discharge or bleeding, hematuria.    LMP: No LMP recorded. Patient has had a hysterectomy.  ROS: As in HPI.  All other pertinent ROS negative.     Past Medical History:  Diagnosis Date  . Diverticulitis 2010  . GERD (gastroesophageal reflux disease)    errosive esophagitis in 2010  . Heart attack ([place])   . Hemorrhoids   . Hiatal hernia   . Hypertension   . Hypothyroidism   . Schatzki's ring    last dilation 2010   Past Surgical History:  Procedure Laterality Date  . ABDOMINAL HYSTERECTOMY  1986   w/ unilateral salpingoopherectomy (can't remember which one)  . APPENDECTOMY    . CHOLECYSTECTOMY    . COLONOSCOPY  11/19/2004   Normal rectum/Normal colon  . COLONOSCOPY  12/11/09   Rourk-minimally friable anal canal/hemorrhoids otherwise normal  . COLONOSCOPY N/A 02/11/2015   Procedure: COLONOSCOPY;  Surgeon: Daneil Dolin, MD;  [facility];  Service: Endoscopy;  Laterality: N/A;  1100 - moved to 10:30 - Candy notified pt  . ESOPHAGOGASTRODUODENOSCOPY  08/15/2008   Moderate size hiatal hernia/Noncritical Schatzki's ring with superimposed distal esophageal erosion consistent with erosive reflux esophagitis  . ESOPHAGOGASTRODUODENOSCOPY N/A 06/11/2014   [id number] ring dilated/HH  . LEFT HEART CATH AND CORONARY ANGIOGRAPHY N/A 05/11/2018   Procedure: LEFT HEART CATH AND CORONARY ANGIOGRAPHY;  Surgeon: Dixie Dials, MD;  Location: [place] [facility];  Service: Cardiovascular;  Laterality: N/A;   Allergies  Allergen  Reactions  . Pantoprazole Sodium Anaphylaxis and Hives   No current facility-administered medications on file prior to encounter.   Current Outpatient Medications on File Prior to Encounter  Medication Sig Dispense Refill  . aspirin EC 81 MG EC tablet Take 1 tablet (81 mg total) by mouth daily.    Marland Kitchen atorvastatin (LIPITOR) 40 MG tablet Take 1 tablet (40 mg total) by mouth daily at 6 PM. 30 tablet 3  . calcium carbonate (TUMS - DOSED IN MG ELEMENTAL CALCIUM) 500 MG chewable tablet Chew 1 tablet by mouth daily.    . Cholecalciferol (VITAMIN D3) 5000 UNITS TABS Take 1 tablet by mouth daily.    Marland Kitchen estradiol (ESTRACE) 0.5 MG tablet Take 1 tablet (0.5 mg total) by mouth daily. 30 tablet 3  . Ginger, Zingiber officinalis, (GINGER PO) Take 1 tablet by mouth daily.     Marland Kitchen losartan-hydrochlorothiazide (HYZAAR) 100-12.5 MG per tablet Take 1 tablet by mouth daily.      . metoprolol tartrate (LOPRESSOR) 25 MG tablet Take 0.5 tablets (12.5 mg total) by mouth 2 (two) times daily. 30 tablet 3  . Multiple Vitamins-Minerals (HAIR SKIN & NAILS ADVANCED PO) Take 1 tablet by mouth daily.    . Multiple Vitamins-Minerals (WOMENS MULTIVITAMIN PLUS) TABS Take 1 tablet by mouth daily.    . nitroGLYCERIN (NITROSTAT) 0.4 MG SL tablet Place 1 tablet (0.4 mg total) under the tongue every 5 (five) minutes x 3 doses as needed for chest pain. 25 tablet 1  . Probiotic Product (ALIGN) 4 MG CAPS Take 4  mg by mouth daily.    . progesterone (PROMETRIUM) 100 MG capsule Take 1 capsule (100 mg total) by mouth daily. 30 capsule 3  . Propylene Glycol (SYSTANE BALANCE) 0.6 % SOLN Apply 1-2 tablets to eye daily as needed (for dry eye relief).    . rosuvastatin (CRESTOR) 40 MG tablet Take 1 tablet (40 mg total) by mouth daily. 90 tablet 1  . thyroid (NP THYROID) 60 MG tablet Take 1 tablet (60 mg total) by mouth daily before breakfast. 30 tablet 3  . TURMERIC PO Take 1 tablet by mouth daily.      Social History   Socioeconomic History  .  Marital status: Married    Spouse name: Not on file  . Number of children: 0  . Years of education: Not on file  . Highest education level: Not on file  Occupational History  . Occupation: owns Dance movement psychotherapist, retired [employer]    Employer: RETIRED  Tobacco Use  . Smoking status: Never Smoker  . Smokeless tobacco: Never Used  Vaping Use  . Vaping Use: Never used  Substance and Sexual Activity  . Alcohol use: No    Alcohol/week: 0.0 standard drinks  . Drug use: No  . Sexual activity: Not on file  Other Topics Concern  . Not on file  Social History Narrative  . Not on file   Social Determinants of Health   Financial Resource Strain:   . Difficulty of Paying Living Expenses:   Food Insecurity:   . Worried About Charity fundraiser in the Last Year:   . Arboriculturist in the Last Year:   Transportation Needs:   . Film/video editor (Medical):   Marland Kitchen Lack of Transportation (Non-Medical):   Physical Activity:   . Days of Exercise per Week:   . Minutes of Exercise per Session:   Stress:   . Feeling of Stress :   Social Connections:   . Frequency of Communication with Friends and Family:   . Frequency of Social Gatherings with Friends and Family:   . Attends Religious Services:   . Active Member of Clubs or Organizations:   . Attends Archivist Meetings:   Marland Kitchen Marital Status:   Intimate Partner Violence:   . Fear of Current or Ex-Partner:   . Emotionally Abused:   Marland Kitchen Physically Abused:   . Sexually Abused:    Family History  Problem Relation Age of Onset  . Hypertension Father   . Stroke Mother   . Diabetes Mother   . Colon cancer Brother 66  . Colon cancer Brother 42  . Prostate cancer Brother     OBJECTIVE:  Vitals:   11/02/19 1116  BP: (!) 150/93  Pulse: 68  Resp: 16  Temp: 97.8 F (36.6 C)  TempSrc: Oral  SpO2: 96%  Weight: 149 lb (67.6 kg)   General appearance: Alert in no acute distress HEENT: NCAT.  Oropharynx clear.    Lungs: clear to auscultation bilaterally without adventitious breath sounds Heart: regular rate and rhythm.   Abdomen: soft; non-distended; no tenderness; bowel sounds present; no guarding Back: no CVA tenderness Extremities: no edema; symmetrical with no gross deformities Skin: warm and dry Neurologic: Ambulates from chair to exam table without difficulty Psychological: alert and cooperative; normal mood and affect  Labs Reviewed  POCT URINALYSIS DIP (MANUAL ENTRY) - Abnormal; Notable for the following components:      Result Value   Color, UA orange (*)  Clarity, UA cloudy (*)    Glucose, UA =100 (*)    Blood, UA moderate (*)    Nitrite, UA Positive (*)    Leukocytes, UA Small (1+) (*)    All other components within normal limits  URINE CULTURE    ASSESSMENT & PLAN:  1. Dysuria     Meds ordered this encounter  Medications  . cephALEXin (KEFLEX) 500 MG capsule    Sig: Take 1 capsule (500 mg total) by mouth 2 (two) times daily for 10 days.    Dispense:  20 capsule    Refill:  0    Order Specific Question:   Supervising Provider    Answer:   Raylene Everts [[id number]]   Urine concerning for UTI Urine culture sent.  We will call you with abnormal results.   Push fluids and get plenty of rest.   Take antibiotic as directed and to completion Follow up with PCP if symptoms persists Return here or go to ER if you have any new or worsening symptoms such as fever, worsening abdominal pain, nausea/vomiting, flank pain, etc...  Outlined signs and symptoms indicating need for more acute intervention. Patient verbalized understanding. After Visit Summary given.     Lestine Box, PA-C 11/02/19 1141

## 2019-11-02 NOTE — ED Triage Notes (Signed)
Pt has been burning when urinating since yesterday.  Drinking water and cranberry juice. Has taken AZO.

## 2019-11-02 NOTE — Discharge Instructions (Signed)
Urine concerning for UTI Urine culture sent.  We will call you with abnormal results.   Push fluids and get plenty of rest.   Take antibiotic as directed and to completion Follow up with PCP if symptoms persists Return here or go to ER if you have any new or worsening symptoms such as fever, worsening abdominal pain, nausea/vomiting, flank pain, etc... 

## 2019-11-04 ENCOUNTER — Other Ambulatory Visit: Payer: Self-pay

## 2019-11-04 ENCOUNTER — Encounter (INDEPENDENT_AMBULATORY_CARE_PROVIDER_SITE_OTHER): Payer: Self-pay | Admitting: Internal Medicine

## 2019-11-04 ENCOUNTER — Ambulatory Visit (INDEPENDENT_AMBULATORY_CARE_PROVIDER_SITE_OTHER): Payer: Federal, State, Local not specified - PPO | Admitting: Internal Medicine

## 2019-11-04 VITALS — BP 115/70 | HR 71 | Temp 96.7°F | Ht 60.0 in | Wt 150.2 lb

## 2019-11-04 DIAGNOSIS — E282 Polycystic ovarian syndrome: Secondary | ICD-10-CM

## 2019-11-04 DIAGNOSIS — E039 Hypothyroidism, unspecified: Secondary | ICD-10-CM | POA: Diagnosis not present

## 2019-11-04 DIAGNOSIS — N951 Menopausal and female climacteric states: Secondary | ICD-10-CM | POA: Diagnosis not present

## 2019-11-04 LAB — URINE CULTURE: Culture: >=100000 — AB

## 2019-11-04 NOTE — Progress Notes (Signed)
Metrics: Intervention Frequency ACO  Documented Smoking Status Yearly  Screened one or more times in 24 months  Cessation Counseling or  Active cessation medication Past 24 months  Past 24 months   Guideline developer: UpToDate (See UpToDate for funding source) Date Released: 2014       Wellness Office Visit  Subjective:  Patient ID: Mackenzie Mcpherson, female    DOB: [birth year]  Age: 69 y.o. MRN: [id number]  CC: This lady comes in for follow-up regarding her menopausal symptoms of hot flashes, hypothyroidism. HPI  She has tolerated the higher dose of NP thyroid without any problems.  Unfortunately, she was not sure about taking estradiol and progesterone after what she read in the pamphlet is that was given to her with the prescriptions.  She was concerned about the possibility of breast cancer. Past Medical History:  Diagnosis Date  . Diverticulitis 2010  . GERD (gastroesophageal reflux disease)    errosive esophagitis in 2010  . Heart attack ([place])   . Hemorrhoids   . Hiatal hernia   . Hypertension   . Hypothyroidism   . Schatzki's ring    last dilation 2010   Past Surgical History:  Procedure Laterality Date  . ABDOMINAL HYSTERECTOMY  1986   w/ unilateral salpingoopherectomy (can't remember which one)  . APPENDECTOMY    . CHOLECYSTECTOMY    . COLONOSCOPY  11/19/2004   Normal rectum/Normal colon  . COLONOSCOPY  12/11/09   Rourk-minimally friable anal canal/hemorrhoids otherwise normal  . COLONOSCOPY N/A 02/11/2015   Procedure: COLONOSCOPY;  Surgeon: Daneil Dolin, MD;  [facility];  Service: Endoscopy;  Laterality: N/A;  1100 - moved to 10:30 - Candy notified pt  . ESOPHAGOGASTRODUODENOSCOPY  08/15/2008   Moderate size hiatal hernia/Noncritical Schatzki's ring with superimposed distal esophageal erosion consistent with erosive reflux esophagitis  . ESOPHAGOGASTRODUODENOSCOPY N/A 06/11/2014   [id number] ring dilated/HH  . LEFT HEART CATH AND CORONARY  ANGIOGRAPHY N/A 05/11/2018   Procedure: LEFT HEART CATH AND CORONARY ANGIOGRAPHY;  Surgeon: Dixie Dials, MD;  Location: [facility];  Service: Cardiovascular;  Laterality: N/A;     Family History  Problem Relation Age of Onset  . Hypertension Father   . Stroke Mother   . Diabetes Mother   . Colon cancer Brother 60  . Colon cancer Brother 47  . Prostate cancer Brother     Social History   Social History Narrative  . Not on file   Social History   Tobacco Use  . Smoking status: Never Smoker  . Smokeless tobacco: Never Used  Substance Use Topics  . Alcohol use: No    Alcohol/week: 0.0 standard drinks    Current Meds  Medication Sig  . aspirin EC 81 MG EC tablet Take 1 tablet (81 mg total) by mouth daily.  Marland Kitchen atorvastatin (LIPITOR) 40 MG tablet Take 1 tablet (40 mg total) by mouth daily at 6 PM.  . calcium carbonate (TUMS - DOSED IN MG ELEMENTAL CALCIUM) 500 MG chewable tablet Chew 1 tablet by mouth daily.  . Cholecalciferol (VITAMIN D3) 5000 UNITS TABS Take 1 tablet by mouth daily.  . Ginger, Zingiber officinalis, (GINGER PO) Take 1 tablet by mouth daily.   Marland Kitchen losartan-hydrochlorothiazide (HYZAAR) 100-12.5 MG per tablet Take 1 tablet by mouth daily.    . metoprolol tartrate (LOPRESSOR) 25 MG tablet Take 0.5 tablets (12.5 mg total) by mouth 2 (two) times daily.  . Multiple Vitamins-Minerals (HAIR SKIN & NAILS ADVANCED PO) Take 1 tablet by  mouth daily.  . Multiple Vitamins-Minerals (WOMENS MULTIVITAMIN PLUS) TABS Take 1 tablet by mouth daily.  . nitroGLYCERIN (NITROSTAT) 0.4 MG SL tablet Place 1 tablet (0.4 mg total) under the tongue every 5 (five) minutes x 3 doses as needed for chest pain.  . Probiotic Product (ALIGN) 4 MG CAPS Take 4 mg by mouth daily.  Marland Kitchen Propylene Glycol (SYSTANE BALANCE) 0.6 % SOLN Apply 1-2 tablets to eye daily as needed (for dry eye relief).  . rosuvastatin (CRESTOR) 40 MG tablet Take 1 tablet (40 mg total) by mouth daily.  Marland Kitchen thyroid (NP THYROID)  60 MG tablet Take 1 tablet (60 mg total) by mouth daily before breakfast.  . TURMERIC PO Take 1 tablet by mouth daily.   . [DISCONTINUED] cephALEXin (KEFLEX) 500 MG capsule Take 1 capsule (500 mg total) by mouth 2 (two) times daily for 10 days.    Depression screen [facility] 2/9 07/24/2019 06/25/2019  Decreased Interest 0 0  Down, Depressed, Hopeless 0 0  PHQ - 2 Score 0 0     Objective:   Today's Vitals: BP 115/70 (BP Location: Left Arm, Patient Position: Sitting, Cuff Size: Normal)   Pulse 71   Temp (!) 96.7 F (35.9 C) (Temporal)   Ht 5' (1.524 m)   Wt 150 lb 3.2 oz (68.1 kg)   SpO2 96%   BMI 29.33 kg/m  Vitals with BMI 11/04/2019 11/02/2019 10/07/2019  Height 5\' 0"  - 5\' 0"   Weight 150 lbs 3 oz 149 lbs 152 lbs  BMI 71.06 - 26.94  Systolic 854 627 035  Diastolic 70 93 80  Pulse 71 68 56     Physical Exam   She looks systemically well.  Weight is stable.  Blood pressure is excellent.   Assessment   1. Hypothyroidism, adult   2. PCOS (polycystic ovarian syndrome)   3. Hot flashes due to menopause       Tests ordered No orders of the defined types were placed in this encounter.    Plan: 1. As far as her hypothyroidism is concerned, she will continue with the same dose of NP thyroid and we will check levels on the next visit.  She appears to have tolerated the higher dose. 2. As far as her menopausal symptoms are concerned, I went through the women's health initiative study once again and discussed the difference between bioidentical and synthetic hormones.  After the long discussion that we had again today, she does feel comfortable taking the bioidentical hormones estradiol and progesterone. 3. I will see her in about 6 weeks time for follow-up we will check all the blood work.   No orders of the defined types were placed in this encounter.   Doree Albee, MD

## 2019-11-26 ENCOUNTER — Other Ambulatory Visit: Payer: Self-pay

## 2019-11-26 ENCOUNTER — Telehealth (INDEPENDENT_AMBULATORY_CARE_PROVIDER_SITE_OTHER): Payer: Federal, State, Local not specified - PPO | Admitting: Nurse Practitioner

## 2019-11-26 VITALS — BP 149/81 | HR 72 | Temp 91.4°F | Resp 19 | Ht 59.0 in | Wt 145.0 lb

## 2019-11-26 DIAGNOSIS — B373 Candidiasis of vulva and vagina: Secondary | ICD-10-CM | POA: Diagnosis not present

## 2019-11-26 DIAGNOSIS — B3731 Acute candidiasis of vulva and vagina: Secondary | ICD-10-CM

## 2019-11-26 MED ORDER — FLUCONAZOLE 150 MG PO TABS: 150.0000 mg | ORAL_TABLET | Freq: Once | ORAL | 0 refills | Status: AC

## 2019-11-26 NOTE — Progress Notes (Signed)
Due to national recommendations of social distancing related to the [place] pandemic, an audio/visual tele-health visit was felt to be the most appropriate encounter type for this patient today. I connected with  Mackenzie Mcpherson on 11/26/19 utilizing audio/visual technology and verified that I am speaking with the correct person using two identifiers. The patient was located at their home, and I was located at the office of [facility] during the encounter. I discussed the limitations of evaluation and management by telemedicine. The patient expressed understanding and agreed to proceed.      Subjective:  Patient ID: Mackenzie Mcpherson, female    DOB: 1950-12-21  Age: 69 y.o. MRN: [id number]  CC:  Chief Complaint  Patient presents with  . Other    Vaginal dryness, burning, discharge      HPI  This patient arrives today for virtual visit for the above.  She tells me that she has been experiencing some vaginal pain with intercourse over the last few weeks.  She tells me she experiences a vaginal burning and sometimes a vaginal itch with intercourse and she will experience some bloody discharge after intercourse.  She has a history of abdominal hysterectomy and tells me she does not have a uterus or cervix.  She is on bioidentical hormone replacement therapy and does take estradiol and progesterone daily, she tells me she started this approximately 2 weeks ago.  She denies any breast tenderness or masses.  She did change lubrication jellies and soaps not that long ago and wonders if this could also be contributing to the pain she is experiencing with vaginal intercourse.  About 1 week ago she changed back to her previous lubricant and Dove soap which she tolerated in the past, she tells me since making this change her symptoms have improved slightly.  She has also tried Monistat over-the-counter which seem to have improved her symptoms as well.  She denies any white or thick  vaginal discharge.   Past Medical History:  Diagnosis Date  . Diverticulitis 2010  . GERD (gastroesophageal reflux disease)    errosive esophagitis in 2010  . Heart attack ([place])   . Hemorrhoids   . Hiatal hernia   . Hypertension   . Hypothyroidism   . Schatzki's ring    last dilation 2010      Family History  Problem Relation Age of Onset  . Hypertension Father   . Stroke Mother   . Diabetes Mother   . Colon cancer Brother 87  . Colon cancer Brother 79  . Prostate cancer Brother     Social History   Social History Narrative  . Not on file   Social History   Tobacco Use  . Smoking status: Never Smoker  . Smokeless tobacco: Never Used  Substance Use Topics  . Alcohol use: No    Alcohol/week: 0.0 standard drinks     No outpatient medications have been marked as taking for the 11/26/19 encounter (Video Visit) with Ailene Ards, NP.    ROS:  See HPI   Objective:   Today's Vitals: BP (!) 149/81 (BP Location: Left Arm, Patient Position: Sitting, Cuff Size: Normal)   Pulse 72   Temp (!) 91.4 F (33 C) (Temporal)   Resp 19   Ht 4\' 11"  (1.499 m)   Wt 145 lb (65.8 kg)   BMI 29.29 kg/m  Vitals with BMI 11/26/2019 11/04/2019 11/02/2019  Height 4\' 11"  5\' 0"  -  Weight 145 lbs 150 lbs 3  oz 149 lbs  BMI 64.33 29.51 -  Systolic 884 166 063  Diastolic 81 70 93  Pulse 72 71 68     Physical Exam Comprehensive physical exam not completed today as office visit was conducted remotely patient did appear well over video, she answered questions appropriately, she was alert and oriented x3.      Assessment and Plan   1. Vaginal yeast infection      Plan: 1.  I think she may be experiencing a vaginal yeast infection as well as some vaginal dryness and atrophy related to her postmenopausal state.  I recommend she continue on her estradiol and progesterone and follow-up as scheduled in approximately 3 weeks to determine if her symptoms are still bothersome with  intercourse or if they have improved.  I will also prescribe Diflucan that she can take to treat possible yeast infection.  I recommended she continue using the lubricant and soap that she has tolerated in the past in the interim.  She tells me she understands.  May need to consider change to vaginal estradiol cream if symptoms persist despite being on oral estradiol, and or consider referral to OB/GYN if symptoms persist for pelvic exam.   Tests ordered No orders of the defined types were placed in this encounter.     No orders of the defined types were placed in this encounter.   Patient to follow-up as scheduled in 3 weeks or sooner.  Ailene Ards, NP

## 2019-12-08 ENCOUNTER — Other Ambulatory Visit (INDEPENDENT_AMBULATORY_CARE_PROVIDER_SITE_OTHER): Payer: Self-pay | Admitting: Internal Medicine

## 2019-12-16 ENCOUNTER — Ambulatory Visit (INDEPENDENT_AMBULATORY_CARE_PROVIDER_SITE_OTHER): Payer: Federal, State, Local not specified - PPO | Admitting: Internal Medicine

## 2019-12-16 ENCOUNTER — Other Ambulatory Visit: Payer: Self-pay

## 2019-12-16 ENCOUNTER — Encounter (INDEPENDENT_AMBULATORY_CARE_PROVIDER_SITE_OTHER): Payer: Self-pay | Admitting: Internal Medicine

## 2019-12-16 VITALS — BP 130/70 | HR 80 | Temp 98.0°F | Resp 18 | Ht 60.0 in | Wt 146.8 lb

## 2019-12-16 DIAGNOSIS — N941 Unspecified dyspareunia: Secondary | ICD-10-CM

## 2019-12-16 DIAGNOSIS — N951 Menopausal and female climacteric states: Secondary | ICD-10-CM | POA: Diagnosis not present

## 2019-12-16 DIAGNOSIS — E039 Hypothyroidism, unspecified: Secondary | ICD-10-CM

## 2019-12-16 LAB — T3, FREE: T3, Free: 4.1 pg/mL (ref 2.3–4.2)

## 2019-12-16 LAB — PROGESTERONE: Progesterone: 11.1 ng/mL

## 2019-12-16 LAB — ESTRADIOL: Estradiol: 69 pg/mL

## 2019-12-16 NOTE — Progress Notes (Signed)
Metrics: Intervention Frequency ACO  Documented Smoking Status Yearly  Screened one or more times in 24 months  Cessation Counseling or  Active cessation medication Past 24 months  Past 24 months   Guideline developer: UpToDate (See UpToDate for funding source) Date Released: 2014       Wellness Office Visit  Subjective:  Patient ID: Mackenzie Mcpherson, female    DOB: 1951/01/27  Age: 69 y.o. MRN: [id number]  CC: This lady comes in for follow-up of obesity, hypothyroidism and menopausal symptoms including dyspareunia. HPI  She had seen Judson Roch not too long ago with dyspareunia and Judson Roch thought she might have a yeast infection.  She did not actually get the medication filled at all but her dyspareunia has improved to some degree. She has tolerated estradiol and progesterone.  She notices progesterone makes her sleepy which I discussed already. She has tolerated the higher dose of NP thyroid also from the last visit. She is struggling to lose weight. Past Medical History:  Diagnosis Date  . Diverticulitis 2010  . GERD (gastroesophageal reflux disease)    errosive esophagitis in 2010  . Heart attack ([place])   . Hemorrhoids   . Hiatal hernia   . Hypertension   . Hypothyroidism   . Schatzki's ring    last dilation 2010   Past Surgical History:  Procedure Laterality Date  . ABDOMINAL HYSTERECTOMY  1986   w/ unilateral salpingoopherectomy (can't remember which one)  . APPENDECTOMY    . CHOLECYSTECTOMY    . COLONOSCOPY  11/19/2004   Normal rectum/Normal colon  . COLONOSCOPY  12/11/09   Rourk-minimally friable anal canal/hemorrhoids otherwise normal  . COLONOSCOPY N/A 02/11/2015   Procedure: COLONOSCOPY;  Surgeon: Daneil Dolin, MD;  [facility];  Service: Endoscopy;  Laterality: N/A;  1100 - moved to 10:30 - Candy notified pt  . ESOPHAGOGASTRODUODENOSCOPY  08/15/2008   Moderate size hiatal hernia/Noncritical Schatzki's ring with superimposed distal esophageal erosion  consistent with erosive reflux esophagitis  . ESOPHAGOGASTRODUODENOSCOPY N/A 06/11/2014   [id number] ring dilated/HH  . LEFT HEART CATH AND CORONARY ANGIOGRAPHY N/A 05/11/2018   Procedure: LEFT HEART CATH AND CORONARY ANGIOGRAPHY;  Surgeon: Dixie Dials, MD;  Location: [facility];  Service: Cardiovascular;  Laterality: N/A;     Family History  Problem Relation Age of Onset  . Hypertension Father   . Stroke Mother   . Diabetes Mother   . Colon cancer Brother 67  . Colon cancer Brother 56  . Prostate cancer Brother     Social History   Social History Narrative  . Not on file   Social History   Tobacco Use  . Smoking status: Never Smoker  . Smokeless tobacco: Never Used  Substance Use Topics  . Alcohol use: No    Alcohol/week: 0.0 standard drinks    Current Meds  Medication Sig  . aspirin EC 81 MG EC tablet Take 1 tablet (81 mg total) by mouth daily.  Marland Kitchen atorvastatin (LIPITOR) 40 MG tablet Take 1 tablet (40 mg total) by mouth daily at 6 PM.  . calcium carbonate (TUMS - DOSED IN MG ELEMENTAL CALCIUM) 500 MG chewable tablet Chew 1 tablet by mouth daily.  . Cholecalciferol (VITAMIN D3) 5000 UNITS TABS Take 1 tablet by mouth daily.  Marland Kitchen estradiol (ESTRACE) 0.5 MG tablet Take 1 tablet (0.5 mg total) by mouth daily.  . Ginger, Zingiber officinalis, (GINGER PO) Take 1 tablet by mouth daily.   Marland Kitchen losartan-hydrochlorothiazide (HYZAAR) 100-12.5 MG per tablet  Take 1 tablet by mouth daily.    . metoprolol tartrate (LOPRESSOR) 25 MG tablet TAKE 1/2 TABLET(12.5 MG) BY MOUTH TWICE DAILY  . Multiple Vitamins-Minerals (HAIR SKIN & NAILS ADVANCED PO) Take 1 tablet by mouth daily.  . Multiple Vitamins-Minerals (WOMENS MULTIVITAMIN PLUS) TABS Take 1 tablet by mouth daily.  . nitroGLYCERIN (NITROSTAT) 0.4 MG SL tablet Place 1 tablet (0.4 mg total) under the tongue every 5 (five) minutes x 3 doses as needed for chest pain.  . Probiotic Product (ALIGN) 4 MG CAPS Take 4 mg by mouth daily.    . progesterone (PROMETRIUM) 100 MG capsule Take 1 capsule (100 mg total) by mouth daily.  Marland Kitchen Propylene Glycol (SYSTANE BALANCE) 0.6 % SOLN Apply 1-2 tablets to eye daily as needed (for dry eye relief).  . rosuvastatin (CRESTOR) 40 MG tablet Take 1 tablet (40 mg total) by mouth daily.  Marland Kitchen thyroid (NP THYROID) 60 MG tablet Take 1 tablet (60 mg total) by mouth daily before breakfast.  . TURMERIC PO Take 1 tablet by mouth daily.        Depression screen [facility] 2/9 07/24/2019 06/25/2019  Decreased Interest 0 0  Down, Depressed, Hopeless 0 0  PHQ - 2 Score 0 0     Objective:   Today's Vitals: BP 130/70 (BP Location: Left Arm, Patient Position: Sitting, Cuff Size: Normal)   Pulse 80   Temp 98 F (36.7 C) (Temporal)   Resp 18   Ht 5' (1.524 m)   Wt 146 lb 12.8 oz (66.6 kg)   SpO2 98%   BMI 28.67 kg/m  Vitals with BMI 12/16/2019 11/26/2019 11/04/2019  Height 5\' 0"  4\' 11"  5\' 0"   Weight 146 lbs 13 oz 145 lbs 150 lbs 3 oz  BMI 28.67 32.99 24.26  Systolic 834 196 222  Diastolic 70 81 70  Pulse 80 72 71     Physical Exam  In fact, since the last time I saw her, she has lost 4 pounds in weight.  Blood pressure is in a good range.     Assessment   1. Dyspareunia in female   2. Hot flashes due to menopause   3. Hypothyroidism, adult       Tests ordered Orders Placed This Encounter  Procedures  . T3, free  . Progesterone  . Estradiol     Plan: 1. She will continue with the current dose of estradiol and progesterone and we will check levels to see if we need to adjust both these hormones upwards. 2. She will also continue with the same dose of NP thyroid and we will check a T3 level. 3. We discussed nutrition again and I discussed the blue zones with her today.  I recommended she reduce animal protein intake to maybe just once a week and focus on a plant-based diet, especially beans and nuts on a daily basis.  I recommend that she drink 70 to 80 ounces of water  daily. 4. Follow-up in 2 months and further recommendations will depend on blood results.   No orders of the defined types were placed in this encounter.   Doree Albee, MD

## 2020-01-08 ENCOUNTER — Other Ambulatory Visit (INDEPENDENT_AMBULATORY_CARE_PROVIDER_SITE_OTHER): Payer: Self-pay | Admitting: Internal Medicine

## 2020-02-03 ENCOUNTER — Other Ambulatory Visit (INDEPENDENT_AMBULATORY_CARE_PROVIDER_SITE_OTHER): Payer: Self-pay | Admitting: Internal Medicine

## 2020-02-05 ENCOUNTER — Other Ambulatory Visit: Payer: Self-pay

## 2020-02-05 ENCOUNTER — Ambulatory Visit (INDEPENDENT_AMBULATORY_CARE_PROVIDER_SITE_OTHER): Payer: Federal, State, Local not specified - PPO

## 2020-02-05 DIAGNOSIS — Z23 Encounter for immunization: Secondary | ICD-10-CM | POA: Diagnosis not present

## 2020-02-05 NOTE — Progress Notes (Signed)
Patient was given High Dose Flu Vaccine in Left Deltoid today.  Patient tolerated the injection well. 

## 2020-02-17 ENCOUNTER — Ambulatory Visit (INDEPENDENT_AMBULATORY_CARE_PROVIDER_SITE_OTHER): Payer: Federal, State, Local not specified - PPO | Admitting: Internal Medicine

## 2020-02-17 ENCOUNTER — Encounter (INDEPENDENT_AMBULATORY_CARE_PROVIDER_SITE_OTHER): Payer: Self-pay | Admitting: Internal Medicine

## 2020-02-17 ENCOUNTER — Other Ambulatory Visit: Payer: Self-pay

## 2020-02-17 VITALS — BP 128/72 | HR 60 | Temp 97.1°F | Ht 60.0 in | Wt 151.6 lb

## 2020-02-17 DIAGNOSIS — N951 Menopausal and female climacteric states: Secondary | ICD-10-CM

## 2020-02-17 DIAGNOSIS — E282 Polycystic ovarian syndrome: Secondary | ICD-10-CM | POA: Diagnosis not present

## 2020-02-17 DIAGNOSIS — N941 Unspecified dyspareunia: Secondary | ICD-10-CM | POA: Diagnosis not present

## 2020-02-17 DIAGNOSIS — E039 Hypothyroidism, unspecified: Secondary | ICD-10-CM

## 2020-02-17 DIAGNOSIS — R7303 Prediabetes: Secondary | ICD-10-CM

## 2020-02-17 DIAGNOSIS — Z23 Encounter for immunization: Secondary | ICD-10-CM

## 2020-02-17 DIAGNOSIS — I10 Essential (primary) hypertension: Secondary | ICD-10-CM

## 2020-02-17 MED ORDER — FIRST-TESTOSTERONE MC 2 % TD CREA: 5.0000 mg | TOPICAL_CREAM | Freq: Every day | TRANSDERMAL | 0 refills | Status: DC

## 2020-02-17 NOTE — Progress Notes (Signed)
Metrics: Intervention Frequency ACO  Documented Smoking Status Yearly  Screened one or more times in 24 months  Cessation Counseling or  Active cessation medication Past 24 months  Past 24 months   Guideline developer: UpToDate (See UpToDate for funding source) Date Released: 2014       Wellness Office Visit  Subjective:  Patient ID: Mackenzie Mcpherson, female    DOB: 1951-01-07  Age: 69 y.o. MRN: [id number]  CC: This lady comes in for follow-up of hypothyroidism, menopausal symptoms, PCOS, prediabetes and hypertension. HPI Overall, she is trying to eat better with more of a plant-based diet.  She continues on estradiol, progesterone and desiccated NP thyroid for her hypothyroidism.  Levels that were drawn on the previous visit were in a good range. She still says that although her dyspareunia has improved to some degree, she still has a problem and has to use over-the-counter gels prior to sexual intercourse.  Past Medical History:  Diagnosis Date  . Diverticulitis 2010  . GERD (gastroesophageal reflux disease)    errosive esophagitis in 2010  . Heart attack ([place])   . Hemorrhoids   . Hiatal hernia   . Hypertension   . Hypothyroidism   . Schatzki's ring    last dilation 2010   Past Surgical History:  Procedure Laterality Date  . ABDOMINAL HYSTERECTOMY  1986   w/ unilateral salpingoopherectomy (can't remember which one)  . APPENDECTOMY    . CHOLECYSTECTOMY    . COLONOSCOPY  11/19/2004   Normal rectum/Normal colon  . COLONOSCOPY  12/11/09   Rourk-minimally friable anal canal/hemorrhoids otherwise normal  . COLONOSCOPY N/A 02/11/2015   Procedure: COLONOSCOPY;  Surgeon: Daneil Dolin, MD;  [facility];  Service: Endoscopy;  Laterality: N/A;  1100 - moved to 10:30 - Candy notified pt  . ESOPHAGOGASTRODUODENOSCOPY  08/15/2008   Moderate size hiatal hernia/Noncritical Schatzki's ring with superimposed distal esophageal erosion consistent with erosive reflux  esophagitis  . ESOPHAGOGASTRODUODENOSCOPY N/A 06/11/2014   [id number] ring dilated/HH  . LEFT HEART CATH AND CORONARY ANGIOGRAPHY N/A 05/11/2018   Procedure: LEFT HEART CATH AND CORONARY ANGIOGRAPHY;  Surgeon: Dixie Dials, MD;  Location: [place] [facility];  Service: Cardiovascular;  Laterality: N/A;     Family History  Problem Relation Age of Onset  . Hypertension Father   . Stroke Mother   . Diabetes Mother   . Colon cancer Brother 38  . Colon cancer Brother 20  . Prostate cancer Brother     Social History   Social History Narrative   Married for 39 years.[employer].   Social History   Tobacco Use  . Smoking status: Never Smoker  . Smokeless tobacco: Never Used  Substance Use Topics  . Alcohol use: No    Alcohol/week: 0.0 standard drinks    Current Meds  Medication Sig  . aspirin EC 81 MG EC tablet Take 1 tablet (81 mg total) by mouth daily.  Marland Kitchen atorvastatin (LIPITOR) 40 MG tablet Take 1 tablet (40 mg total) by mouth daily at 6 PM.  . BLACK CURRANT SEED OIL PO Take by mouth. Puts some in her coffee daily  . calcium carbonate (TUMS - DOSED IN MG ELEMENTAL CALCIUM) 500 MG chewable tablet Chew 1 tablet by mouth daily.  . Cholecalciferol (VITAMIN D3) 5000 UNITS TABS Take 1 tablet by mouth daily.  Marland Kitchen estradiol (ESTRACE) 0.5 MG tablet TAKE 1 TABLET(0.5 MG) BY MOUTH DAILY  . Ginger, Zingiber officinalis, (GINGER PO) Take 1 tablet by mouth daily.   Marland Kitchen  losartan-hydrochlorothiazide (HYZAAR) 100-12.5 MG tablet TAKE 1 TABLET BY MOUTH EVERY DAY  . metoprolol tartrate (LOPRESSOR) 25 MG tablet TAKE 1/2 TABLET(12.5 MG) BY MOUTH TWICE DAILY  . Multiple Vitamins-Minerals (HAIR SKIN & NAILS ADVANCED PO) Take 1 tablet by mouth daily.  . Multiple Vitamins-Minerals (WOMENS MULTIVITAMIN PLUS) TABS Take 1 tablet by mouth daily.  . nitroGLYCERIN (NITROSTAT) 0.4 MG SL tablet Place 1 tablet (0.4 mg total) under the tongue every 5 (five) minutes x 3 doses as needed for chest  pain.  . NP THYROID 60 MG tablet TAKE 1 TABLET(60 MG) BY MOUTH DAILY BEFORE BREAKFAST  . Probiotic Product (ALIGN) 4 MG CAPS Take 4 mg by mouth daily.  . progesterone (PROMETRIUM) 100 MG capsule TAKE 1 CAPSULE(100 MG) BY MOUTH DAILY  . Propylene Glycol (SYSTANE BALANCE) 0.6 % SOLN Apply 1-2 tablets to eye daily as needed (for dry eye relief).  . rosuvastatin (CRESTOR) 40 MG tablet TAKE 1 TABLET(40 MG) BY MOUTH DAILY  . TURMERIC PO Take 1 tablet by mouth daily.       Depression screen [facility] 2/9 07/24/2019 06/25/2019  Decreased Interest 0 0  Down, Depressed, Hopeless 0 0  PHQ - 2 Score 0 0     Objective:   Today's Vitals: BP 128/72   Pulse 60   Temp (!) 97.1 F (36.2 C) (Temporal)   Ht 5' (1.524 m)   Wt 151 lb 9.6 oz (68.8 kg)   SpO2 98%   BMI 29.61 kg/m  Vitals with BMI 02/17/2020 12/16/2019 11/26/2019  Height 5\' 0"  5\' 0"  4\' 11"   Weight 151 lbs 10 oz 146 lbs 13 oz 145 lbs  BMI 29.61 55.97 41.63  Systolic 845 364 680  Diastolic 72 70 81  Pulse 60 80 72     Physical Exam  She looks systemically well.  She has gained about 5 pounds since last visit.  Blood pressure is excellent.     Assessment   1. Dyspareunia in female   2. Hypothyroidism, adult   3. Hot flashes due to menopause   4. PCOS (polycystic ovarian syndrome)   5. Prediabetes   6. Essential hypertension, benign       Tests ordered No orders of the defined types were placed in this encounter.    Plan: 1. We discussed her dyspareunia and I think she would actually benefit from testosterone cream therapy and I have called a prescription to [facility] for testosterone cream 5 mg applied to the labia daily.  I described possible side effects to her. 2. She will continue with the same dose of NP thyroid for hypothyroidism.  Her levels were in a good range last time. 3. She will continue with estradiol and progesterone for menopausal symptoms. 4. She will continue to focus on nutrition. 5. I will see  her in about 6 weeks to see how she is doing and we will draw blood work then.  Prevnar 13 vaccination was given today.   Meds ordered this encounter  Medications  . Testosterone Propionate (FIRST-TESTOSTERONE MC) 2 % CREA    Sig: Place 5 mg onto the skin daily.    Dispense:  30 g    Refill:  0    Prynce Jacober Luther Parody, MD

## 2020-02-17 NOTE — Addendum Note (Signed)
Addended by: Anibal Henderson on: 02/17/2020 01:31 PM   Modules accepted: Orders

## 2020-03-05 ENCOUNTER — Encounter: Payer: Self-pay | Admitting: Internal Medicine

## 2020-03-06 ENCOUNTER — Encounter: Payer: Self-pay | Admitting: Internal Medicine

## 2020-03-31 ENCOUNTER — Ambulatory Visit (INDEPENDENT_AMBULATORY_CARE_PROVIDER_SITE_OTHER): Payer: Federal, State, Local not specified - PPO | Admitting: Internal Medicine

## 2020-03-31 ENCOUNTER — Encounter (INDEPENDENT_AMBULATORY_CARE_PROVIDER_SITE_OTHER): Payer: Self-pay | Admitting: Internal Medicine

## 2020-03-31 ENCOUNTER — Other Ambulatory Visit: Payer: Self-pay

## 2020-03-31 VITALS — BP 128/70 | HR 73 | Temp 97.3°F | Resp 18 | Ht 60.0 in | Wt 151.2 lb

## 2020-03-31 DIAGNOSIS — E039 Hypothyroidism, unspecified: Secondary | ICD-10-CM | POA: Diagnosis not present

## 2020-03-31 DIAGNOSIS — E282 Polycystic ovarian syndrome: Secondary | ICD-10-CM | POA: Diagnosis not present

## 2020-03-31 DIAGNOSIS — N941 Unspecified dyspareunia: Secondary | ICD-10-CM | POA: Diagnosis not present

## 2020-03-31 NOTE — Progress Notes (Signed)
Metrics: Intervention Frequency ACO  Documented Smoking Status Yearly  Screened one or more times in 24 months  Cessation Counseling or  Active cessation medication Past 24 months  Past 24 months   Guideline developer: UpToDate (See UpToDate for funding source) Date Released: 2014       Wellness Office Visit  Subjective:  Patient ID: Mackenzie Mcpherson, female    DOB: 18-Mar-1951  Age: 69 y.o. MRN: [id number]  CC: This lady comes in for treatment of dyspareunia, PCOS, hypothyroidism. HPI  Unfortunately, she did not get testosterone cream filled as recommended last time.  I think she was somewhat confused as to the difference between testosterone and the other hormones including estradiol and progesterone.  I have explained this to her now. I reviewed her estradiol level which was reasonable but not as optimal as I would like.  Progesterone levels are in a good range. Past Medical History:  Diagnosis Date  . Diverticulitis 2010  . GERD (gastroesophageal reflux disease)    errosive esophagitis in 2010  . Heart attack ([place])   . Hemorrhoids   . Hiatal hernia   . Hypertension   . Hypothyroidism   . Schatzki's ring    last dilation 2010   Past Surgical History:  Procedure Laterality Date  . ABDOMINAL HYSTERECTOMY  1986   w/ unilateral salpingoopherectomy (can't remember which one)  . APPENDECTOMY    . CHOLECYSTECTOMY    . COLONOSCOPY  11/19/2004   Normal rectum/Normal colon  . COLONOSCOPY  12/11/09   Rourk-minimally friable anal canal/hemorrhoids otherwise normal  . COLONOSCOPY N/A 02/11/2015   Procedure: COLONOSCOPY;  Surgeon: Daneil Dolin, MD;  [facility];  Service: Endoscopy;  Laterality: N/A;  1100 - moved to 10:30 - Candy notified pt  . ESOPHAGOGASTRODUODENOSCOPY  08/15/2008   Moderate size hiatal hernia/Noncritical Schatzki's ring with superimposed distal esophageal erosion consistent with erosive reflux esophagitis  . ESOPHAGOGASTRODUODENOSCOPY N/A  06/11/2014   [id number] ring dilated/HH  . LEFT HEART CATH AND CORONARY ANGIOGRAPHY N/A 05/11/2018   Procedure: LEFT HEART CATH AND CORONARY ANGIOGRAPHY;  Surgeon: Dixie Dials, MD;  Location: [place] [facility];  Service: Cardiovascular;  Laterality: N/A;     Family History  Problem Relation Age of Onset  . Hypertension Father   . Stroke Mother   . Diabetes Mother   . Colon cancer Brother 76  . Colon cancer Brother 64  . Prostate cancer Brother     Social History   Social History Narrative   Married for 39 years.[employer].   Social History   Tobacco Use  . Smoking status: Never Smoker  . Smokeless tobacco: Never Used  Substance Use Topics  . Alcohol use: No    Alcohol/week: 0.0 standard drinks    Current Meds  Medication Sig  . aspirin EC 81 MG EC tablet Take 1 tablet (81 mg total) by mouth daily.  Marland Kitchen atorvastatin (LIPITOR) 40 MG tablet Take 1 tablet (40 mg total) by mouth daily at 6 PM.  . BLACK CURRANT SEED OIL PO Take by mouth. Puts some in her coffee daily  . calcium carbonate (TUMS - DOSED IN MG ELEMENTAL CALCIUM) 500 MG chewable tablet Chew 1 tablet by mouth daily.  . Cholecalciferol (VITAMIN D3) 5000 UNITS TABS Take 1 tablet by mouth daily.  Marland Kitchen estradiol (ESTRACE) 0.5 MG tablet TAKE 1 TABLET(0.5 MG) BY MOUTH DAILY  . Ginger, Zingiber officinalis, (GINGER PO) Take 1 tablet by mouth daily.   Marland Kitchen losartan-hydrochlorothiazide (HYZAAR) 100-12.5  MG tablet TAKE 1 TABLET BY MOUTH EVERY DAY  . metoprolol tartrate (LOPRESSOR) 25 MG tablet TAKE 1/2 TABLET(12.5 MG) BY MOUTH TWICE DAILY  . Multiple Vitamins-Minerals (HAIR SKIN & NAILS ADVANCED PO) Take 1 tablet by mouth daily.  . Multiple Vitamins-Minerals (WOMENS MULTIVITAMIN PLUS) TABS Take 1 tablet by mouth daily.  . nitroGLYCERIN (NITROSTAT) 0.4 MG SL tablet Place 1 tablet (0.4 mg total) under the tongue every 5 (five) minutes x 3 doses as needed for chest pain.  . NP THYROID 60 MG tablet TAKE 1 TABLET(60  MG) BY MOUTH DAILY BEFORE BREAKFAST  . Probiotic Product (ALIGN) 4 MG CAPS Take 4 mg by mouth daily.  . progesterone (PROMETRIUM) 100 MG capsule TAKE 1 CAPSULE(100 MG) BY MOUTH DAILY  . Propylene Glycol (SYSTANE BALANCE) 0.6 % SOLN Apply 1-2 tablets to eye daily as needed (for dry eye relief).  . rosuvastatin (CRESTOR) 40 MG tablet TAKE 1 TABLET(40 MG) BY MOUTH DAILY  . Testosterone Propionate (FIRST-TESTOSTERONE MC) 2 % CREA Place 5 mg onto the skin daily.  . TURMERIC PO Take 1 tablet by mouth daily.       Depression screen [facility] 2/9 07/24/2019 06/25/2019  Decreased Interest 0 0  Down, Depressed, Hopeless 0 0  PHQ - 2 Score 0 0     Objective:   Today's Vitals: BP 128/70 (BP Location: Right Arm, Patient Position: Sitting, Cuff Size: Normal)   Pulse 73   Temp (!) 97.3 F (36.3 C) (Temporal)   Resp 18   Ht 5' (1.524 m)   Wt 151 lb 3.2 oz (68.6 kg)   SpO2 96%   BMI 29.53 kg/m  Vitals with BMI 03/31/2020 02/17/2020 12/16/2019  Height 5\' 0"  5\' 0"  5\' 0"   Weight 151 lbs 3 oz 151 lbs 10 oz 146 lbs 13 oz  BMI 29.53 71.21 97.58  Systolic 832 549 826  Diastolic 70 72 70  Pulse 73 60 80     Physical Exam  She looks systemically well.  There is no new physical findings.     Assessment   1. PCOS (polycystic ovarian syndrome)   2. Hypothyroidism, adult   3. Dyspareunia in female       Tests ordered No orders of the defined types were placed in this encounter.    Plan: 1. I have called [employer] and they will go ahead and make the testosterone cream for as she is now agreeable.  I have discussed possible side effects and benefits with her and she is agreeable in shared decision-making as part of her wellness plan. 2. I have also recommended that she increase the dose of estradiol to a dose of estradiol 1 mg daily and she will start that by doubling the dose of estradiol 0.5 mg tablets that she has.  She will let me know if she tolerates it and I will go ahead and  prescribe estradiol 1 mg tablet in the future. 3. Follow-up in 2 months.   No orders of the defined types were placed in this encounter.   Doree Albee, MD

## 2020-04-13 ENCOUNTER — Other Ambulatory Visit (INDEPENDENT_AMBULATORY_CARE_PROVIDER_SITE_OTHER): Payer: Self-pay | Admitting: Internal Medicine

## 2020-04-13 ENCOUNTER — Telehealth (INDEPENDENT_AMBULATORY_CARE_PROVIDER_SITE_OTHER): Payer: Self-pay

## 2020-04-13 MED ORDER — ESTRADIOL 1 MG PO TABS: 1.0000 mg | ORAL_TABLET | Freq: Every day | ORAL | 3 refills | Status: DC

## 2020-04-13 NOTE — Telephone Encounter (Signed)
Patient called and left a detailed voice message that she needs a refill on the following medication and has a question:  estradiol (ESTRACE) 0.5 MG tablet  Last filled 02/03/2020, # 30 with 3 refills  Patient stated that Dr. Anastasio Champion advised for her to take 2 pills daily instead of 1 pill daily and she wants to know if she needs to continue taking 2 daily or go back to taking 1 daily?  Please advise as she needs a refill.

## 2020-04-13 NOTE — Telephone Encounter (Signed)
Let the patient know that, yes, I did want you to take 2 pills of the estradiol 0.5 mg tablets.  It appears that she is probably tolerating this higher dose so I have now sent a new prescription for estradiol 1 mg tablet, take 1 tablet daily.  This prescription has been sent to [facility] on [address].  Please emphasize to the patient that because this is a higher dose, she should only take 1 tablet daily of the estradiol 1 mg tablet.

## 2020-04-14 NOTE — Telephone Encounter (Signed)
Called patient and gave her the message. Patient verbalized an understanding and has been notified by [employer].

## 2020-04-28 ENCOUNTER — Encounter: Payer: Self-pay | Admitting: Gastroenterology

## 2020-04-28 ENCOUNTER — Telehealth (INDEPENDENT_AMBULATORY_CARE_PROVIDER_SITE_OTHER): Payer: Federal, State, Local not specified - PPO | Admitting: Gastroenterology

## 2020-04-28 ENCOUNTER — Telehealth: Payer: Self-pay | Admitting: *Deleted

## 2020-04-28 ENCOUNTER — Telehealth: Payer: Self-pay

## 2020-04-28 DIAGNOSIS — R143 Flatulence: Secondary | ICD-10-CM | POA: Diagnosis not present

## 2020-04-28 MED ORDER — SUPREP BOWEL PREP KIT 17.5-3.13-1.6 GM/177ML PO SOLN: 1.0000 | ORAL | 0 refills | Status: DC

## 2020-04-28 NOTE — Telephone Encounter (Signed)
Called pt, TCS w/Dr. Jena Gauss conscious sedation scheduled for 06/10/20 at 2:30pm. Covid test 06/09/20 at 9:15am. Orders entered. Rx for Suprep sent to pharmacy. Appt letter mailed with procedure instructions.

## 2020-04-28 NOTE — Patient Instructions (Signed)
Colonoscopy as scheduled. See separate instructions.  

## 2020-04-28 NOTE — Progress Notes (Signed)
Cc'ed to pcp °

## 2020-04-28 NOTE — Telephone Encounter (Signed)
Pt consented to a virtual visit. 

## 2020-04-28 NOTE — Progress Notes (Signed)
Primary Care Physician:  Doree Albee, MD Primary GI:  Garfield Cornea, MD   Patient Location: Home  Provider Location: [facility] office  Reason for Phone Visit:  Chief Complaint  Patient presents with  . Consult    Schedule TCS. Last done 2016. Doing okay    Persons present on the phone encounter, with roles: Patient, myself (provider),Mindy Estudillo CMA (updated meds and allergies)  Total time (minutes) spent on medical discussion: 15 mintues  Due to COVID-19, visit was conducted using telephonic method (no video was available).  Visit was requested by patient. Patient unsuccessful in connecting with Mychart Video  Virtual Visit via Telephone only  I connected with Ms. Kimbrough on 04/28/20 at 11:00 AM EST by telephone and verified that I am speaking with the correct person using two identifiers.   I discussed the limitations, risks, security and privacy concerns of performing an evaluation and management service by telephone and the availability of in person appointments. I also discussed with the patient that there may be a patient responsible charge related to this service. The patient expressed understanding and agreed to proceed.   HPI:   Mackenzie Mcpherson is a 70 y.o. female who presents for virtual visit regarding scheduling colonoscopy.   Last colonoscopy in 2016.  Exam was normal.  Due to family history of 2 first-degree relatives with colon cancer, she was advised to have next high risk screening colonoscopy in 5 years.  BM regular. 2 stools per day. No melena, brbpr. No abdominal pain. Only occasional heartburn if eats red meat, resolves with TUMS. Tries to avoid trigger foods. No N/V. No dysphagia. Healthy eater. Eats a lot of greens.  Eats chicken and fish. Rare red meat. Trying to lose a little more weight. Goal of 10 more pounds. Current BMI of 29.  Had mild heart attack 04/2018. Due to stress. No blockages. Occurred after a telephone argument. Noted to  have severe vasospasm of proximal RCA on cardiac cath.    Current Outpatient Medications  Medication Sig Dispense Refill  . aspirin EC 81 MG EC tablet Take 1 tablet (81 mg total) by mouth daily.    Marland Kitchen BLACK CURRANT SEED OIL PO Take by mouth. Puts some in her coffee daily    . calcium carbonate (TUMS - DOSED IN MG ELEMENTAL CALCIUM) 500 MG chewable tablet Chew 1 tablet by mouth daily. As needed    . Cholecalciferol (VITAMIN D3) 5000 UNITS TABS Take 1 tablet by mouth daily.    Marland Kitchen estradiol (ESTRACE) 1 MG tablet Take 1 tablet (1 mg total) by mouth daily. 30 tablet 3  . Ginger, Zingiber officinalis, (GINGER PO) Take 1 tablet by mouth daily. As needed    . losartan-hydrochlorothiazide (HYZAAR) 100-12.5 MG tablet TAKE 1 TABLET BY MOUTH EVERY DAY 30 tablet 3  . metoprolol tartrate (LOPRESSOR) 25 MG tablet TAKE 1/2 TABLET(12.5 MG) BY MOUTH TWICE DAILY 180 tablet 0  . Multiple Vitamins-Minerals (WOMENS MULTIVITAMIN PLUS) TABS Take 1 tablet by mouth daily.    . nitroGLYCERIN (NITROSTAT) 0.4 MG SL tablet Place 1 tablet (0.4 mg total) under the tongue every 5 (five) minutes x 3 doses as needed for chest pain. 25 tablet 1  . NP THYROID 60 MG tablet TAKE 1 TABLET(60 MG) BY MOUTH DAILY BEFORE BREAKFAST 30 tablet 3  . progesterone (PROMETRIUM) 100 MG capsule TAKE 1 CAPSULE(100 MG) BY MOUTH DAILY 30 capsule 3  . Propylene Glycol 0.6 % SOLN Apply 1-2 tablets to eye  daily as needed (for dry eye relief).    . rosuvastatin (CRESTOR) 40 MG tablet TAKE 1 TABLET(40 MG) BY MOUTH DAILY 90 tablet 1  . Testosterone Propionate (FIRST-TESTOSTERONE MC) 2 % CREA Place 5 mg onto the skin daily. 30 g 0  . TURMERIC PO Take 1 tablet by mouth daily.      No current facility-administered medications for this visit.    Past Medical History:  Diagnosis Date  . Diverticulitis 2010  . GERD (gastroesophageal reflux disease)    errosive esophagitis in 2010  . Heart attack ([facility])    spasm of RCA  . Hemorrhoids   . Hiatal hernia    . Hypertension   . Hypothyroidism   . Schatzki's ring    last dilation 2010    Past Surgical History:  Procedure Laterality Date  . ABDOMINAL HYSTERECTOMY  1986   w/ unilateral salpingoopherectomy (can't remember which one)  . APPENDECTOMY    . CHOLECYSTECTOMY    . COLONOSCOPY  11/19/2004   Normal rectum/Normal colon  . COLONOSCOPY  12/11/09   Rourk-minimally friable anal canal/hemorrhoids otherwise normal  . COLONOSCOPY N/A 02/11/2015   rourk: normal. next exam in 5 years.  . ESOPHAGOGASTRODUODENOSCOPY  08/15/2008   Moderate size hiatal hernia/Noncritical Schatzki's ring with superimposed distal esophageal erosion consistent with erosive reflux esophagitis  . ESOPHAGOGASTRODUODENOSCOPY N/A 06/11/2014   [id number] ring dilated/HH  . LEFT HEART CATH AND CORONARY ANGIOGRAPHY N/A 05/11/2018   Procedure: LEFT HEART CATH AND CORONARY ANGIOGRAPHY;  Surgeon: Dixie Dials, MD;  Location: [place] [facility];  Service: Cardiovascular;  Laterality: N/A;    Family History  Problem Relation Age of Onset  . Hypertension Father   . Stroke Mother   . Diabetes Mother   . Colon cancer Brother 75  . Colon cancer Brother 40  . Prostate cancer Brother     Social History   Socioeconomic History  . Marital status: Married    Spouse name: Not on file  . Number of children: 0  . Years of education: Not on file  . Highest education level: Not on file  Occupational History  . Occupation: owns Dance movement psychotherapist, retired [employer]    Employer: RETIRED  Tobacco Use  . Smoking status: Never Smoker  . Smokeless tobacco: Never Used  Vaping Use  . Vaping Use: Never used  Substance and Sexual Activity  . Alcohol use: No    Alcohol/week: 0.0 standard drinks  . Drug use: No  . Sexual activity: Not on file  Other Topics Concern  . 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   Social Determinants of Health   Financial Resource Strain:  Not on file  Food Insecurity: Not on file  Transportation Needs: Not on file  Physical Activity: Not on file  Stress: Not on file  Social Connections: Not on file  Intimate Partner Violence: Not on file      ROS:  General: Negative for anorexia, weight loss, fever, chills, fatigue, weakness. Eyes: Negative for vision changes.  ENT: Negative for hoarseness, difficulty swallowing , nasal congestion. CV: Negative for chest pain, angina, palpitations, dyspnea on exertion, peripheral edema.  Respiratory: Negative for dyspnea at rest, dyspnea on exertion, cough, sputum, wheezing.  GI: See history of present illness. GU:  Negative for dysuria, hematuria, urinary incontinence, urinary frequency, nocturnal urination.  MS: Negative for joint pain, low back pain.  Derm: Negative for rash or itching.  Neuro: Negative for  weakness, abnormal sensation, seizure, frequent headaches, memory loss, confusion.  Psych: Negative for anxiety, depression, suicidal ideation, hallucinations.  Endo: Negative for unusual weight change.  Heme: Negative for bruising or bleeding. Allergy: Negative for rash or hives.   Observations/Objective: Pleasant cooperative female in no acute distress. More detailed exam unavailable.  Assessment and Plan: 70 year old female with family history of colon cancer in 2 first-degree relatives, presenting to schedule 5 year high risk screening colonoscopy. No bowel concerns. States it was a little hard for her to wake up after her last procedure. Received average amounts of Versed/Demerol (3/75). Plan for conscious sedation in the near future. ASA III.  I have discussed the risks, alternatives, benefits with regards to but not limited to the risk of reaction to medication, bleeding, infection, perforation and the patient is agreeable to proceed. Written consent to be obtained.   Follow Up Instructions:    I discussed the assessment and treatment plan with the patient. The  patient was provided an opportunity to ask questions and all were answered. The patient agreed with the plan and demonstrated an understanding of the instructions. AVS mailed to patient's home address.   The patient was advised to call back or seek an in-person evaluation if the symptoms worsen or if the condition fails to improve as anticipated.  I provided 15 minutes of non-face-to-face time during this encounter.   Tana Coast, PA-C

## 2020-04-28 NOTE — Telephone Encounter (Signed)
Mackenzie Mcpherson, you are scheduled for a virtual visit with your provider today.  Just as we do with appointments in the office, we must obtain your consent to participate.  Your consent will be active for this visit and any virtual visit you may have with one of our providers in the next 365 days.  If you have a MyChart account, I can also send a copy of this consent to you electronically.  All virtual visits are billed to your insurance company just like a traditional visit in the office.  As this is a virtual visit, video technology does not allow for your provider to perform a traditional examination.  This may limit your provider's ability to fully assess your condition.  If your provider identifies any concerns that need to be evaluated in person or the need to arrange testing such as labs, EKG, etc, we will make arrangements to do so.  Although advances in technology are sophisticated, we cannot ensure that it will always work on either your end or our end.  If the connection with a video visit is poor, we may have to switch to a telephone visit.  With either a video or telephone visit, we are not always able to ensure that we have a secure connection.   I need to obtain your verbal consent now.   Are you willing to proceed with your visit today?

## 2020-05-25 ENCOUNTER — Other Ambulatory Visit: Payer: Self-pay

## 2020-05-25 ENCOUNTER — Ambulatory Visit (INDEPENDENT_AMBULATORY_CARE_PROVIDER_SITE_OTHER): Payer: Federal, State, Local not specified - PPO | Admitting: Internal Medicine

## 2020-05-25 ENCOUNTER — Encounter (INDEPENDENT_AMBULATORY_CARE_PROVIDER_SITE_OTHER): Payer: Self-pay | Admitting: Internal Medicine

## 2020-05-25 VITALS — BP 138/76 | HR 75 | Temp 97.5°F | Ht 60.0 in | Wt 153.0 lb

## 2020-05-25 DIAGNOSIS — R7303 Prediabetes: Secondary | ICD-10-CM

## 2020-05-25 DIAGNOSIS — E039 Hypothyroidism, unspecified: Secondary | ICD-10-CM | POA: Diagnosis not present

## 2020-05-25 DIAGNOSIS — I10 Essential (primary) hypertension: Secondary | ICD-10-CM

## 2020-05-25 DIAGNOSIS — E785 Hyperlipidemia, unspecified: Secondary | ICD-10-CM

## 2020-05-25 DIAGNOSIS — E282 Polycystic ovarian syndrome: Secondary | ICD-10-CM

## 2020-05-25 DIAGNOSIS — N941 Unspecified dyspareunia: Secondary | ICD-10-CM

## 2020-05-25 NOTE — Progress Notes (Signed)
Metrics: Intervention Frequency ACO  Documented Smoking Status Yearly  Screened one or more times in 24 months  Cessation Counseling or  Active cessation medication Past 24 months  Past 24 months   Guideline developer: UpToDate (See UpToDate for funding source) Date Released: 2014       Wellness Office Visit  Subjective:  Patient ID: Mackenzie Mcpherson, female    DOB: June 21, 1950  Age: 70 y.o. MRN: [id number]  CC: This lady comes in for follow-up of PCOS, hypothyroidism, prediabetes, hypertension, dyslipidemia and dyspareunia. HPI  On the last visit, we started testosterone cream and she has done very well with this.  She describes that the dyspareunia has improved significantly and her sex drive is improved as well as energy. She has tolerated the higher dose of estradiol 1 mg daily and continues on progesterone 100 mg at night. She continues on desiccated NP thyroid for hypothyroidism. She has not been as consistent with her diet that she would like but she will start to do this more now. She does have some hirsutism but she feels this is manageable at the present time. She continues on antihypertensive medications for hypertension are listed above. She also continues on statin therapy as she has had coronary artery disease documented in the past. Past Medical History:  Diagnosis Date  . Diverticulitis 2010  . GERD (gastroesophageal reflux disease)    errosive esophagitis in 2010  . Heart attack ([facility])    spasm of RCA  . Hemorrhoids   . Hiatal hernia   . Hypertension   . Hypothyroidism   . Schatzki's ring    last dilation 2010   Past Surgical History:  Procedure Laterality Date  . ABDOMINAL HYSTERECTOMY  1986   w/ unilateral salpingoopherectomy (can't remember which one)  . APPENDECTOMY    . CHOLECYSTECTOMY    . COLONOSCOPY  11/19/2004   Normal rectum/Normal colon  . COLONOSCOPY  12/11/09   Rourk-minimally friable anal canal/hemorrhoids otherwise normal  . COLONOSCOPY  N/A 02/11/2015   rourk: normal. next exam in 5 years.  . ESOPHAGOGASTRODUODENOSCOPY  08/15/2008   Moderate size hiatal hernia/Noncritical Schatzki's ring with superimposed distal esophageal erosion consistent with erosive reflux esophagitis  . ESOPHAGOGASTRODUODENOSCOPY N/A 06/11/2014   [id number] ring dilated/HH  . LEFT HEART CATH AND CORONARY ANGIOGRAPHY N/A 05/11/2018   Procedure: LEFT HEART CATH AND CORONARY ANGIOGRAPHY;  Surgeon: Dixie Dials, MD;  Location: [place] [facility];  Service: Cardiovascular;  Laterality: N/A;     Family History  Problem Relation Age of Onset  . Hypertension Father   . Stroke Mother   . Diabetes Mother   . Colon cancer Brother 56  . Colon cancer Brother 20  . Prostate cancer Brother     Social History   Social History Narrative   Married for 39 years.[employer].   Social History   Tobacco Use  . Smoking status: Never Smoker  . Smokeless tobacco: Never Used  Substance Use Topics  . Alcohol use: No    Alcohol/week: 0.0 standard drinks    Current Meds  Medication Sig  . aspirin EC 81 MG EC tablet Take 1 tablet (81 mg total) by mouth daily.  Marland Kitchen BLACK CURRANT SEED OIL PO Take by mouth. Puts some in her coffee daily  . calcium carbonate (TUMS - DOSED IN MG ELEMENTAL CALCIUM) 500 MG chewable tablet Chew 1 tablet by mouth daily. As needed  . Cholecalciferol (VITAMIN D3) 5000 UNITS TABS Take 1 tablet by mouth daily.  Marland Kitchen  estradiol (ESTRACE) 1 MG tablet Take 1 tablet (1 mg total) by mouth daily.  . Ginger, Zingiber officinalis, (GINGER PO) Take 1 tablet by mouth daily. As needed  . losartan-hydrochlorothiazide (HYZAAR) 100-12.5 MG tablet TAKE 1 TABLET BY MOUTH EVERY DAY  . metoprolol tartrate (LOPRESSOR) 25 MG tablet TAKE 1/2 TABLET(12.5 MG) BY MOUTH TWICE DAILY  . Multiple Vitamins-Minerals (WOMENS MULTIVITAMIN PLUS) TABS Take 1 tablet by mouth daily.  . Na Sulfate-K Sulfate-Mg Sulf (SUPREP BOWEL PREP KIT) 17.5-3.13-1.6 GM/177ML  SOLN Take 1 kit by mouth as directed.  . nitroGLYCERIN (NITROSTAT) 0.4 MG SL tablet Place 1 tablet (0.4 mg total) under the tongue every 5 (five) minutes x 3 doses as needed for chest pain.  . NP THYROID 60 MG tablet TAKE 1 TABLET(60 MG) BY MOUTH DAILY BEFORE BREAKFAST  . Omega-3 Fatty Acids (FISH OIL) 1000 MG CAPS Take 1 capsule by mouth daily.  . progesterone (PROMETRIUM) 100 MG capsule TAKE 1 CAPSULE(100 MG) BY MOUTH DAILY  . Propylene Glycol 0.6 % SOLN Apply 1-2 tablets to eye daily as needed (for dry eye relief).  . rosuvastatin (CRESTOR) 40 MG tablet TAKE 1 TABLET(40 MG) BY MOUTH DAILY  . Testosterone Propionate (FIRST-TESTOSTERONE MC) 2 % CREA Place 5 mg onto the skin daily.  . TURMERIC PO Take 1 tablet by mouth daily.       Depression screen [facility] 2/9 05/25/2020 07/24/2019 06/25/2019  Decreased Interest 0 0 0  Down, Depressed, Hopeless 0 0 0  PHQ - 2 Score 0 0 0  Altered sleeping 0 - -  Tired, decreased energy 0 - -  Change in appetite 0 - -  Feeling bad or failure about yourself  0 - -  Trouble concentrating 0 - -  Moving slowly or fidgety/restless 0 - -  Suicidal thoughts 0 - -  PHQ-9 Score 0 - -  Difficult doing work/chores Not difficult at all - -     Objective:   Today's Vitals: BP 138/76   Pulse 75   Temp (!) 97.5 F (36.4 C) (Temporal)   Ht 5' (1.524 m)   Wt 153 lb (69.4 kg)   SpO2 99%   BMI 29.88 kg/m  Vitals with BMI 05/25/2020 03/31/2020 02/17/2020  Height _0  _1  _2   Weight 153 lbs 151 lbs 3 oz 151 lbs 10 oz  BMI 29.88 46.27 03.50  Systolic 093 818 299  Diastolic 76 70 72  Pulse 75 73 60     Physical Exam  Her weight is largely unchanged, she remains overweight.  Blood pressure is acceptable for now.  She is alert and orientated without any focal logical signs.     Assessment   1. Dyspareunia in female   2. PCOS (polycystic ovarian syndrome)   3. Hypothyroidism, adult   4. Prediabetes   5. Essential hypertension, benign   6.  Dyslipidemia       Tests ordered Orders Placed This Encounter  Procedures  . COMPLETE METABOLIC PANEL WITH GFR  . Estradiol  . Hemoglobin A1c  . Lipid panel  . Progesterone  . T3, free  . TSH  . Testos,Total,Free and SHBG (Female)     Plan: 1. She will continue with testosterone cream and we will check levels today. 2. She will continue with desiccated NP thyroid and we will check thyroid function today. 3. I will also check her estradiol and progesterone levels although she has not taken her estradiol tablet this morning. 4. She will continue with antihypertensive  medications above. 5. Further recommendations will depend on blood results and I will see her in 3 months time for follow-up.   No orders of the defined types were placed in this encounter.   Doree Albee, MD

## 2020-05-28 ENCOUNTER — Other Ambulatory Visit (INDEPENDENT_AMBULATORY_CARE_PROVIDER_SITE_OTHER): Payer: Self-pay | Admitting: Internal Medicine

## 2020-05-28 ENCOUNTER — Other Ambulatory Visit (HOSPITAL_COMMUNITY): Payer: Self-pay | Admitting: Internal Medicine

## 2020-05-28 DIAGNOSIS — Z1231 Encounter for screening mammogram for malignant neoplasm of breast: Secondary | ICD-10-CM

## 2020-05-28 LAB — COMPLETE METABOLIC PANEL WITH GFR
AG Ratio: 1.5 (calc) (ref 1.0–2.5)
ALT: 15 U/L (ref 6–29)
AST: 16 U/L (ref 10–35)
Albumin: 4 g/dL (ref 3.6–5.1)
Alkaline phosphatase (APISO): 47 U/L (ref 37–153)
BUN: 13 mg/dL (ref 7–25)
CO2: 28 mmol/L (ref 20–32)
Calcium: 9.4 mg/dL (ref 8.6–10.4)
Chloride: 104 mmol/L (ref 98–110)
Creat: 0.85 mg/dL (ref 0.60–0.93)
GFR, Est African American: 80 mL/min/{1.73_m2} (ref >=60)
GFR, Est Non African American: 69 mL/min/{1.73_m2} (ref >=60)
Globulin: 2.7 g/dL (calc) (ref 1.9–3.7)
Glucose, Bld: 98 mg/dL (ref 65–139)
Potassium: 4.4 mmol/L (ref 3.5–5.3)
Sodium: 140 mmol/L (ref 135–146)
Total Bilirubin: 0.3 mg/dL (ref 0.2–1.2)
Total Protein: 6.7 g/dL (ref 6.1–8.1)

## 2020-05-28 LAB — ESTRADIOL: Estradiol: 32 pg/mL

## 2020-05-28 LAB — PROGESTERONE: Progesterone: 11.3 ng/mL

## 2020-05-28 LAB — LIPID PANEL
Cholesterol: 105 mg/dL (ref <200)
HDL: 50 mg/dL (ref >=50)
LDL Cholesterol (Calc): 40 mg/dL (calc)
Non-HDL Cholesterol (Calc): 55 mg/dL (calc) (ref <130)
Total CHOL/HDL Ratio: 2.1 (calc) (ref <5.0)
Triglycerides: 69 mg/dL (ref <150)

## 2020-05-28 LAB — TESTOS,TOTAL,FREE AND SHBG (FEMALE)
Free Testosterone: 234.3 pg/mL — ABNORMAL HIGH (ref 0.2–3.7)
Sex Hormone Binding: 38 nmol/L (ref 14–73)
Testosterone, Total, LC-MS-MS: 1070 ng/dL — ABNORMAL HIGH (ref 2–45)

## 2020-05-28 LAB — HEMOGLOBIN A1C
Hgb A1c MFr Bld: 5.9 % of total Hgb — ABNORMAL HIGH (ref <5.7)
Mean Plasma Glucose: 123 mg/dL
eAG (mmol/L): 6.8 mmol/L

## 2020-05-28 LAB — T3, FREE: T3, Free: 3.5 pg/mL (ref 2.3–4.2)

## 2020-05-28 LAB — TSH: TSH: 1.06 mIU/L (ref 0.40–4.50)

## 2020-05-28 MED ORDER — THYROID 90 MG PO TABS: 90.0000 mg | ORAL_TABLET | Freq: Every day | ORAL | 3 refills | Status: DC

## 2020-06-01 ENCOUNTER — Ambulatory Visit (INDEPENDENT_AMBULATORY_CARE_PROVIDER_SITE_OTHER): Payer: Federal, State, Local not specified - PPO | Admitting: Internal Medicine

## 2020-06-03 ENCOUNTER — Other Ambulatory Visit (INDEPENDENT_AMBULATORY_CARE_PROVIDER_SITE_OTHER): Payer: Self-pay | Admitting: Nurse Practitioner

## 2020-06-08 ENCOUNTER — Telehealth: Payer: Self-pay

## 2020-06-08 NOTE — Telephone Encounter (Signed)
Pt called office and LMOVM, she needs to reschedule TCS that was for 06/10/20.  Called pt, informed her TCS is cancelled. Will call her to reschedule when Dr. Roseanne Kaufman April schedule is available.

## 2020-06-09 ENCOUNTER — Other Ambulatory Visit (HOSPITAL_COMMUNITY): Payer: Federal, State, Local not specified - PPO

## 2020-06-09 NOTE — Telephone Encounter (Signed)
Called pt, TCS w/Dr. Gala Romney rescheduled to 08/12/20--AM. COVID test 08/10/20 at 9:00am. Orders entered. Appt letter mailed with new procedure instructions.

## 2020-06-10 ENCOUNTER — Ambulatory Visit (HOSPITAL_COMMUNITY)
Admission: RE | Admit: 2020-06-10 | Payer: Federal, State, Local not specified - PPO | Source: Home / Self Care | Admitting: Internal Medicine

## 2020-06-10 ENCOUNTER — Encounter (HOSPITAL_COMMUNITY): Admission: RE | Payer: Self-pay | Source: Home / Self Care

## 2020-06-10 SURGERY — COLONOSCOPY
Anesthesia: Moderate Sedation

## 2020-06-19 ENCOUNTER — Other Ambulatory Visit (INDEPENDENT_AMBULATORY_CARE_PROVIDER_SITE_OTHER): Payer: Self-pay | Admitting: Internal Medicine

## 2020-07-02 ENCOUNTER — Other Ambulatory Visit (INDEPENDENT_AMBULATORY_CARE_PROVIDER_SITE_OTHER): Payer: Self-pay | Admitting: Internal Medicine

## 2020-07-06 ENCOUNTER — Ambulatory Visit (HOSPITAL_COMMUNITY): Payer: Federal, State, Local not specified - PPO

## 2020-07-15 ENCOUNTER — Ambulatory Visit (HOSPITAL_COMMUNITY)
Admission: RE | Admit: 2020-07-15 | Discharge: 2020-07-15 | Disposition: A | Discharge status: Home / Self Care | Payer: Federal, State, Local not specified - PPO | Source: Ambulatory Visit | Attending: Internal Medicine | Admitting: Internal Medicine

## 2020-07-15 ENCOUNTER — Other Ambulatory Visit: Payer: Self-pay

## 2020-07-15 DIAGNOSIS — Z1231 Encounter for screening mammogram for malignant neoplasm of breast: Secondary | ICD-10-CM | POA: Diagnosis not present

## 2020-08-10 ENCOUNTER — Other Ambulatory Visit (HOSPITAL_COMMUNITY)
Admission: RE | Admit: 2020-08-10 | Discharge: 2020-08-10 | Disposition: A | Discharge status: Home / Self Care | Payer: Federal, State, Local not specified - PPO | Source: Ambulatory Visit | Attending: Internal Medicine | Admitting: Internal Medicine

## 2020-08-10 ENCOUNTER — Other Ambulatory Visit: Payer: Self-pay

## 2020-08-10 DIAGNOSIS — Z79890 Hormone replacement therapy (postmenopausal): Secondary | ICD-10-CM | POA: Diagnosis not present

## 2020-08-10 DIAGNOSIS — D120 Benign neoplasm of cecum: Secondary | ICD-10-CM | POA: Diagnosis not present

## 2020-08-10 DIAGNOSIS — Z20822 Contact with and (suspected) exposure to covid-19: Secondary | ICD-10-CM | POA: Diagnosis not present

## 2020-08-10 DIAGNOSIS — Z7982 Long term (current) use of aspirin: Secondary | ICD-10-CM | POA: Diagnosis not present

## 2020-08-10 DIAGNOSIS — Z01812 Encounter for preprocedural laboratory examination: Secondary | ICD-10-CM | POA: Insufficient documentation

## 2020-08-10 DIAGNOSIS — Z800 Family history of malignant neoplasm of digestive organs: Secondary | ICD-10-CM | POA: Diagnosis not present

## 2020-08-10 DIAGNOSIS — Z888 Allergy status to other drugs, medicaments and biological substances status: Secondary | ICD-10-CM | POA: Diagnosis not present

## 2020-08-10 DIAGNOSIS — Z1211 Encounter for screening for malignant neoplasm of colon: Secondary | ICD-10-CM | POA: Diagnosis not present

## 2020-08-10 DIAGNOSIS — Z79899 Other long term (current) drug therapy: Secondary | ICD-10-CM | POA: Diagnosis not present

## 2020-08-10 LAB — SARS CORONAVIRUS 2 (TAT 6-24 HRS): SARS Coronavirus 2: NEGATIVE

## 2020-08-12 ENCOUNTER — Other Ambulatory Visit: Payer: Self-pay

## 2020-08-12 ENCOUNTER — Ambulatory Visit (HOSPITAL_COMMUNITY)
Admission: RE | Admit: 2020-08-12 | Discharge: 2020-08-12 | Disposition: A | Discharge status: Home / Self Care | Payer: Federal, State, Local not specified - PPO | Attending: Internal Medicine | Admitting: Internal Medicine

## 2020-08-12 ENCOUNTER — Encounter (HOSPITAL_COMMUNITY): Payer: Self-pay | Admitting: Internal Medicine

## 2020-08-12 ENCOUNTER — Encounter (HOSPITAL_COMMUNITY)
Admission: RE | Disposition: A | Discharge status: Home / Self Care | Payer: Self-pay | Source: Home / Self Care | Attending: Internal Medicine

## 2020-08-12 DIAGNOSIS — Z79899 Other long term (current) drug therapy: Secondary | ICD-10-CM | POA: Insufficient documentation

## 2020-08-12 DIAGNOSIS — Z20822 Contact with and (suspected) exposure to covid-19: Secondary | ICD-10-CM | POA: Insufficient documentation

## 2020-08-12 DIAGNOSIS — Z800 Family history of malignant neoplasm of digestive organs: Secondary | ICD-10-CM

## 2020-08-12 DIAGNOSIS — K635 Polyp of colon: Secondary | ICD-10-CM

## 2020-08-12 DIAGNOSIS — Z888 Allergy status to other drugs, medicaments and biological substances status: Secondary | ICD-10-CM | POA: Insufficient documentation

## 2020-08-12 DIAGNOSIS — Z1211 Encounter for screening for malignant neoplasm of colon: Secondary | ICD-10-CM | POA: Insufficient documentation

## 2020-08-12 DIAGNOSIS — Z79890 Hormone replacement therapy (postmenopausal): Secondary | ICD-10-CM | POA: Insufficient documentation

## 2020-08-12 DIAGNOSIS — D120 Benign neoplasm of cecum: Secondary | ICD-10-CM | POA: Insufficient documentation

## 2020-08-12 DIAGNOSIS — Z7982 Long term (current) use of aspirin: Secondary | ICD-10-CM | POA: Insufficient documentation

## 2020-08-12 HISTORY — PX: POLYPECTOMY: SHX5525

## 2020-08-12 HISTORY — PX: COLONOSCOPY: SHX5424

## 2020-08-12 SURGERY — COLONOSCOPY
Anesthesia: Moderate Sedation

## 2020-08-12 MED ORDER — ONDANSETRON HCL 4 MG/2ML IJ SOLN
INTRAMUSCULAR | Status: DC | PRN
  Administered 2020-08-12: 4 mg via INTRAVENOUS

## 2020-08-12 MED ORDER — MEPERIDINE HCL 50 MG/ML IJ SOLN
INTRAMUSCULAR | Status: AC
  Filled 2020-08-12: qty 1

## 2020-08-12 MED ORDER — MEPERIDINE HCL 100 MG/ML IJ SOLN
INTRAMUSCULAR | Status: DC | PRN
  Administered 2020-08-12: 25 mg via INTRAVENOUS

## 2020-08-12 MED ORDER — SODIUM CHLORIDE 0.9 % IV SOLN: INTRAVENOUS | Status: DC

## 2020-08-12 MED ORDER — STERILE WATER FOR IRRIGATION IR SOLN
Status: DC | PRN
  Administered 2020-08-12: 1.5 mL

## 2020-08-12 MED ORDER — MIDAZOLAM HCL 5 MG/5ML IJ SOLN
INTRAMUSCULAR | Status: AC
  Filled 2020-08-12: qty 10

## 2020-08-12 MED ORDER — ONDANSETRON HCL 4 MG/2ML IJ SOLN
INTRAMUSCULAR | Status: AC
  Filled 2020-08-12: qty 2

## 2020-08-12 MED ORDER — MIDAZOLAM HCL 5 MG/5ML IJ SOLN
INTRAMUSCULAR | Status: DC | PRN
  Administered 2020-08-12: 2 mg via INTRAVENOUS
  Administered 2020-08-12: 1 mg via INTRAVENOUS

## 2020-08-12 NOTE — Discharge Instructions (Signed)
Colonoscopy Discharge Instructions  Read the instructions outlined below and refer to this sheet in the next few weeks. These discharge instructions provide you with general information on caring for yourself after you leave the hospital. Your doctor may also give you specific instructions. While your treatment has been planned according to the most current medical practices available, unavoidable complications occasionally occur. If you have any problems or questions after discharge, call Dr. Gala Romney at [phone]. ACTIVITY  You may resume your regular activity, but move at a slower pace for the next 24 hours.   Take frequent rest periods for the next 24 hours.   Walking will help get rid of the air and reduce the bloated feeling in your belly (abdomen).   No driving for 24 hours (because of the medicine (anesthesia) used during the test).    Do not sign any important legal documents or operate any machinery for 24 hours (because of the anesthesia used during the test).  NUTRITION  Drink plenty of fluids.   You may resume your normal diet as instructed by your doctor.   Begin with a light meal and progress to your normal diet. Heavy or fried foods are harder to digest and may make you feel sick to your stomach (nauseated).   Avoid alcoholic beverages for 24 hours or as instructed.  MEDICATIONS  You may resume your normal medications unless your doctor tells you otherwise.  WHAT YOU CAN EXPECT TODAY  Some feelings of bloating in the abdomen.   Passage of more gas than usual.   Spotting of blood in your stool or on the toilet paper.  IF YOU HAD POLYPS REMOVED DURING THE COLONOSCOPY:  No aspirin products for 7 days or as instructed.   No alcohol for 7 days or as instructed.   Eat a soft diet for the next 24 hours.  FINDING OUT THE RESULTS OF YOUR TEST Not all test results are available during your visit. If your test results are not back during the visit, make an appointment  with your caregiver to find out the results. Do not assume everything is normal if you have not heard from your caregiver or the medical facility. It is important for you to follow up on all of your test results.  SEEK IMMEDIATE MEDICAL ATTENTION IF:  You have more than a spotting of blood in your stool.   Your belly is swollen (abdominal distention).   You are nauseated or vomiting.   You have a temperature over 101.   You have abdominal pain or discomfort that is severe or gets worse throughout the day.    1 polyp removed from your colon today  Further recommendations to follow pending review of pathology report  Request I called Mackenzie Mcpherson at [phone] -reviewed results and recommendations  PATIENT INSTRUCTIONS POST-ANESTHESIA  IMMEDIATELY FOLLOWING SURGERY:  Do not drive or operate machinery for the first twenty four hours after surgery.  Do not make any important decisions for twenty four hours after surgery or while taking narcotic pain medications or sedatives.  If you develop intractable nausea and vomiting or a severe headache please notify your doctor immediately.  FOLLOW-UP:  Please make an appointment with your surgeon as instructed. You do not need to follow up with anesthesia unless specifically instructed to do so.  WOUND CARE INSTRUCTIONS (if applicable):  Keep a dry clean dressing on the anesthesia/puncture wound site if there is drainage.  Once the wound has quit draining you may leave it open  to air.  Generally you should leave the bandage intact for twenty four hours unless there is drainage.  If the epidural site drains for more than 36-48 hours please call the anesthesia department.  QUESTIONS?:  Please feel free to call your physician or the hospital operator if you have any questions, and they will be happy to assist you.      Colon Polyps  Colon polyps are tissue growths inside the colon, which is part of the large intestine. They are one of the  types of polyps that can grow in the body. A polyp may be a round bump or a mushroom-shaped growth. You could have one polyp or more than one. Most colon polyps are noncancerous (benign). However, some colon polyps can become cancerous over time. Finding and removing the polyps early can help prevent this. What are the causes? The exact cause of colon polyps is not known. What increases the risk? The following factors may make you more likely to develop this condition:  Having a family history of colorectal cancer or colon polyps.  Being older than 72 years of age.  Being younger than 72 years of age and having a significant family history of colorectal cancer or colon polyps or a genetic condition that puts you at higher risk of getting colon polyps.  Having inflammatory bowel disease, such as ulcerative colitis or Crohn's disease.  Having certain conditions passed from parent to child (hereditary conditions), such as: ? Familial adenomatous polyposis (FAP). ? Lynch syndrome. ? Turcot syndrome. ? Peutz-Jeghers syndrome. ? MUTYH-associated polyposis (MAP).  Being overweight.  Certain lifestyle factors. These include smoking cigarettes, drinking too much alcohol, not getting enough exercise, and eating a diet that is high in fat and red meat and low in fiber.  Having had childhood cancer that was treated with radiation of the abdomen. What are the signs or symptoms? Many times, there are no symptoms. If you have symptoms, they may include:  Blood coming from the rectum during a bowel movement.  Blood in the stool (feces). The blood may be bright red or very dark in color.  Pain in the abdomen.  A change in bowel habits, such as constipation or diarrhea. How is this diagnosed? This condition is diagnosed with a colonoscopy. This is a procedure in which a lighted, flexible scope is inserted into the opening between the buttocks (anus) and then passed into the colon to examine the  area. Polyps are sometimes found when a colonoscopy is done as part of routine cancer screening tests. How is this treated? This condition is treated by removing any polyps that are found. Most polyps can be removed during a colonoscopy. Those polyps will then be tested for cancer. Additional treatment may be needed depending on the results of testing. Follow these instructions at home: Eating and drinking  Eat foods that are high in fiber, such as fruits, vegetables, and whole grains.  Eat foods that are high in calcium and vitamin D, such as milk, cheese, yogurt, eggs, liver, fish, and broccoli.  Limit foods that are high in fat, such as fried foods and desserts.  Limit the amount of red meat, precooked or cured meat, or other processed meat that you eat, such as hot dogs, sausages, bacon, or meat loaves.  Limit sugary drinks.   Lifestyle  Maintain a healthy weight, or lose weight if recommended by your health care provider.  Exercise every day or as told by your health care provider.  Do not  use any products that contain nicotine or tobacco, such as cigarettes, e-cigarettes, and chewing tobacco. If you need help quitting, ask your health care provider.  Do not drink alcohol if: ? Your health care provider tells you not to drink. ? You are pregnant, may be pregnant, or are planning to become pregnant.  If you drink alcohol: ? Limit how much you use to:  0-1 drink a day for women.  0-2 drinks a day for men. ? Know how much alcohol is in your drink. In the [place] one drink equals one 12 oz bottle of beer (355 mL), one 5 oz glass of wine (148 mL), or one 1 oz glass of hard liquor (44 mL). General instructions  Take over-the-counter and prescription medicines only as told by your health care provider.  Keep all follow-up visits. This is important. This includes having regularly scheduled colonoscopies. Talk to your health care provider about when you need a colonoscopy. Contact a  health care provider if:  You have new or worsening bleeding during a bowel movement.  You have new or increased blood in your stool.  You have a change in bowel habits.  You lose weight for no known reason. Summary  Colon polyps are tissue growths inside the colon, which is part of the large intestine. They are one type of polyp that can grow in the body.  Most colon polyps are noncancerous (benign), but some can become cancerous over time.  This condition is diagnosed with a colonoscopy.  This condition is treated by removing any polyps that are found. Most polyps can be removed during a colonoscopy. This information is not intended to replace advice given to you by your health care provider. Make sure you discuss any questions you have with your health care provider. Document Revised: 07/31/2019 Document Reviewed: 07/31/2019 Elsevier Patient Education  2021 [employer].

## 2020-08-12 NOTE — Op Note (Signed)
[facility] Patient Name: Mackenzie Mcpherson Procedure Date: 08/12/2020 8:30 AM MRN: [id number] Date of Birth: 28-May-1950 Attending MD: Norvel Richards , MD CSN: [id number] Age: 70 Admit Type: Outpatient Procedure:                Colonoscopy Indications:              Screening patient at increased risk: Family history                            of colorectal cancer in multiple 1st-degree                            relatives Providers:                Norvel Richards, MD, Caprice Kluver, Raphael Gibney, Technician Referring MD:              Medicines:                Midazolam 3 mg IV, Meperidine 25 mg IV Complications:            No immediate complications. Estimated Blood Loss:     Estimated blood loss was minimal. Procedure:                Pre-Anesthesia Assessment:                           - Prior to the procedure, a History and Physical                            was performed, and patient medications and                            allergies were reviewed. The patient's tolerance of                            previous anesthesia was also reviewed. The risks                            and benefits of the procedure and the sedation                            options and risks were discussed with the patient.                            All questions were answered, and informed consent                            was obtained. Prior Anticoagulants: The patient has                            taken no previous anticoagulant or antiplatelet  agents. ASA Grade Assessment: III - A patient with                            severe systemic disease. After reviewing the risks                            and benefits, the patient was deemed in                            satisfactory condition to undergo the procedure.                           After obtaining informed consent, the colonoscope                            was passed under  direct vision. Throughout the                            procedure, the patient's blood pressure, pulse, and                            oxygen saturations were monitored continuously. The                            CF-[id number] ([id number]) scope was introduced through                            the anus and advanced to the the cecum, identified                            by appendiceal orifice and ileocecal valve. The                            colonoscopy was performed without difficulty. The                            patient tolerated the procedure well. The quality                            of the bowel preparation was adequate. Scope In: 8:52:05 AM Scope Out: 9:06:12 AM Scope Withdrawal Time: 0 hours 6 minutes 8 seconds  Total Procedure Duration: 0 hours 14 minutes 7 seconds  Findings:      The perianal and digital rectal examinations were normal.      A 6 mm polyp was found in the ileocecal valve. The polyp was       semi-pedunculated. The polyp was removed with a cold snare. Resection       and retrieval were complete. Estimated blood loss was minimal.      The exam was otherwise without abnormality on direct and retroflexion       views. Impression:               - One 6 mm polyp at the ileocecal valve, removed  with a cold snare. Resected and retrieved.                           - The examination was otherwise normal on direct                            and retroflexion views. Moderate Sedation:      Moderate (conscious) sedation was administered by the endoscopy nurse       and supervised by the endoscopist. The following parameters were       monitored: oxygen saturation, heart rate, blood pressure, respiratory       rate, EKG, adequacy of pulmonary ventilation, and response to care.       Total physician intraservice time was 20 minutes. Recommendation:           - Patient has a contact number available for                            emergencies. The  signs and symptoms of potential                            delayed complications were discussed with the                            patient. Return to normal activities tomorrow.                            Written discharge instructions were provided to the                            patient.                           - Resume previous diet.                           - Continue present medications.                           - Repeat colonoscopy date to be determined after                            pending pathology results are reviewed for                            surveillance.                           - Return to GI office (date not yet determined). Procedure Code(s):        --- Professional ---                           [phone], Colonoscopy, flexible; with removal of                            tumor(s), polyp(s), or other lesion(s) by snare  technique                           G0500, Moderate sedation services provided by the                            same physician or other qualified health care                            professional performing a gastrointestinal                            endoscopic service that sedation supports,                            requiring the presence of an independent trained                            observer to assist in the monitoring of the                            patient's level of consciousness and physiological                            status; initial 15 minutes of intra-service time;                            patient age 48 years or older (additional time may                            be reported with [phone], as appropriate) Diagnosis Code(s):        --- Professional ---                           Z80.0, Family history of malignant neoplasm of                            digestive organs                           K63.5, Polyp of colon CPT copyright 2019 American Medical Association. All rights reserved. The  codes documented in this report are preliminary and upon coder review may  be revised to meet current compliance requirements. Cristopher Estimable. Jun Rightmyer, MD Norvel Richards, MD 08/12/2020 9:58:22 AM This report has been signed electronically. Number of Addenda: 0

## 2020-08-12 NOTE — H&P (Signed)
_0 @   Primary Care Physician:  Doree Albee, MD Primary Gastroenterologist:  Dr. Gala Romney  Pre-Procedure History & Physical: HPI:  Mackenzie Mcpherson is a 70 y.o. female is here for a screening colonoscopy.  Negative colonoscopy about 5 years ago.  Positive family history of colon cancer in 2 first-degree relatives.  No bowel symptoms. Past Medical History:  Diagnosis Date  . Diverticulitis 2010  . GERD (gastroesophageal reflux disease)    errosive esophagitis in 2010  . Heart attack (HCC)    spasm of RCA  . Hemorrhoids   . Hiatal hernia   . Hypertension   . Hypothyroidism   . Schatzki's ring    last dilation 2010    Past Surgical History:  Procedure Laterality Date  . ABDOMINAL HYSTERECTOMY  1986   w/ unilateral salpingoopherectomy (can't remember which one)  . APPENDECTOMY    . CHOLECYSTECTOMY    . COLONOSCOPY  11/19/2004   Normal rectum/Normal colon  . COLONOSCOPY  12/11/09   Eddie Koc-minimally friable anal canal/hemorrhoids otherwise normal  . COLONOSCOPY N/A 02/11/2015   Clancy Leiner: normal. next exam in 5 years.  . ESOPHAGOGASTRODUODENOSCOPY  08/15/2008   Moderate size hiatal hernia/Noncritical Schatzki's ring with superimposed distal esophageal erosion consistent with erosive reflux esophagitis  . ESOPHAGOGASTRODUODENOSCOPY N/A 06/11/2014   [id number] ring dilated/HH  . LEFT HEART CATH AND CORONARY ANGIOGRAPHY N/A 05/11/2018   Procedure: LEFT HEART CATH AND CORONARY ANGIOGRAPHY;  Surgeon: Dixie Dials, MD;  Location: [facility];  Service: Cardiovascular;  Laterality: N/A;    Prior to Admission medications   Medication Sig Start Date End Date Taking? Authorizing Provider  aspirin EC 81 MG EC tablet Take 1 tablet (81 mg total) by mouth daily. 05/12/18  Yes Dixie Dials, MD  BLACK CURRANT SEED OIL PO Take 1 drop by mouth daily. Liquid   Yes [provider]  calcium carbonate (TUMS - DOSED IN MG ELEMENTAL CALCIUM) 500 MG chewable tablet Chew  500-1,000 mg by mouth 2 (two) times daily as needed for indigestion or heartburn.   Yes [provider]  Cholecalciferol (VITAMIN D3) 5000 UNITS TABS Take 5,000 Units by mouth daily.   Yes [provider]  estradiol (ESTRACE) 1 MG tablet Take 1 tablet (1 mg total) by mouth daily. 04/13/20  Yes Gosrani, Nimish C, MD  Ginger, Zingiber officinalis, (GINGER PO) Take 1 tablet by mouth 2 (two) times a week.   Yes [provider]  losartan-hydrochlorothiazide (HYZAAR) 100-12.5 MG tablet TAKE 1 TABLET BY MOUTH EVERY DAY Patient taking differently: Take 1 tablet by mouth daily. 06/19/20  Yes Gosrani, Nimish C, MD  metoprolol tartrate (LOPRESSOR) 25 MG tablet TAKE 1/2 TABLET BY MOUTH TWICE DAILY Patient taking differently: 12.5 mg 2 (two) times daily. 06/03/20  Yes Gosrani, Nimish C, MD  Multiple Vitamin (MULTIVITAMIN WITH MINERALS) TABS tablet Take 2 tablets by mouth daily.   Yes [provider]  Na Sulfate-K Sulfate-Mg Sulf (SUPREP BOWEL PREP KIT) 17.5-3.13-1.6 GM/177ML SOLN Take 1 kit by mouth as directed. 04/28/20  Yes Kaleel Schmieder, Cristopher Estimable, MD  Omega-3 Fatty Acids (FISH OIL) 1000 MG CAPS Take 1,000 mg by mouth daily.   Yes [provider]  Polyethyl Glycol-Propyl Glycol (SYSTANE OP) Place 1 drop into both eyes daily as needed (dry eyes).   Yes [provider]  Probiotic Product (PROBIOTIC PO) Take 1 capsule by mouth daily.   Yes [provider]  progesterone (PROMETRIUM) 100 MG capsule TAKE 1 CAPSULE(100 MG) BY MOUTH DAILY Patient taking  differently: Take 100 mg by mouth daily. 07/02/20  Yes Gosrani, Nimish C, MD  rosuvastatin (CRESTOR) 40 MG tablet TAKE 1 TABLET(40 MG) BY MOUTH DAILY Patient taking differently: Take 40 mg by mouth daily at 6 PM. 01/08/20  Yes Ailene Ards, NP  Testosterone Propionate (FIRST-TESTOSTERONE MC) 2 % CREA Place 5 mg onto the skin daily. 02/17/20  Yes Gosrani, Nimish C, MD  thyroid (NP THYROID) 90 MG tablet Take 1 tablet (90  mg total) by mouth daily. 05/28/20  Yes Gosrani, Nimish C, MD  TURMERIC PO Take 1 capsule by mouth daily.   Yes [provider]  VITAMIN E PO Take 1 capsule by mouth daily.   Yes [provider]  fluticasone (FLONASE) 50 MCG/ACT nasal spray Place 1-2 sprays into both nostrils daily as needed for allergies or rhinitis.    [provider]  nitroGLYCERIN (NITROSTAT) 0.4 MG SL tablet Place 1 tablet (0.4 mg total) under the tongue every 5 (five) minutes x 3 doses as needed for chest pain. 05/11/18   Dixie Dials, MD  sodium chloride (OCEAN) 0.65 % SOLN nasal spray Place 1 spray into both nostrils at bedtime as needed for congestion.    [provider]    Allergies as of 06/09/2020 - Review Complete 06/02/2020  Allergen Reaction Noted  . Pantoprazole sodium Anaphylaxis and Hives     Family History  Problem Relation Age of Onset  . Hypertension Father   . Stroke Mother   . Diabetes Mother   . Colon cancer Brother 30  . Colon cancer Brother 35  . Prostate cancer Brother     Social History   Socioeconomic History  . Marital status: Married    Spouse name: Not on file  . Number of children: 0  . Years of education: Not on file  . Highest education level: Not on file  Occupational History  . Occupation: owns Dance movement psychotherapist, retired [employer]    Employer: RETIRED  Tobacco Use  . Smoking status: Never Smoker  . Smokeless tobacco: Never Used  Vaping Use  . Vaping Use: Never used  Substance and Sexual Activity  . Alcohol use: No    Alcohol/week: 0.0 standard drinks  . Drug use: No  . Sexual activity: Not on file  Other Topics Concern  . 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   Social Determinants of Health   Financial Resource Strain: Not on file  Food Insecurity: Not on file  Transportation Needs: Not on file  Physical Activity: Not on file  Stress: Not on file  Social  Connections: Not on file  Intimate Partner Violence: Not on file    Review of Systems: See HPI, otherwise negative ROS  Physical Exam: BP (!) 148/68   Temp 98.3 F (36.8 C) (Oral)   Resp 16   Ht 5' (1.524 m)   Wt 70.3 kg   SpO2 99%   BMI 30.27 kg/m  General:   Alert,  Well-developed, well-nourished, pleasant and cooperative in NAD l. Neck:  Supple; no masses or thyromegaly. Lungs:  Clear throughout to auscultation.   No wheezes, crackles, or rhonchi. No acute distress. Heart:  Regular rate and rhythm; no murmurs, clicks, rubs,  or gallops. Abdomen:  Soft, nontender and nondistended. No masses, hepatosplenomegaly or hernias noted. Normal bowel sounds, without guarding, and without rebound.    Impression/Plan: Mackenzie Mcpherson is now here to undergo a screening colonoscopy.  High  risk examination.  Risks, benefits, limitations, imponderables and alternatives regarding colonoscopy have been reviewed with the patient. Questions have been answered. All parties agreeable.     Notice:  This dictation was prepared with Dragon dictation along with smaller phrase technology. Any transcriptional errors that result from this process are unintentional and may not be corrected upon review.

## 2020-08-13 LAB — SURGICAL PATHOLOGY

## 2020-08-14 ENCOUNTER — Encounter (HOSPITAL_COMMUNITY): Payer: Self-pay | Admitting: Internal Medicine

## 2020-08-14 ENCOUNTER — Other Ambulatory Visit (INDEPENDENT_AMBULATORY_CARE_PROVIDER_SITE_OTHER): Payer: Self-pay | Admitting: Internal Medicine

## 2020-08-15 ENCOUNTER — Encounter: Payer: Self-pay | Admitting: Internal Medicine

## 2020-08-24 ENCOUNTER — Ambulatory Visit (INDEPENDENT_AMBULATORY_CARE_PROVIDER_SITE_OTHER): Payer: Federal, State, Local not specified - PPO | Admitting: Internal Medicine

## 2020-09-16 ENCOUNTER — Other Ambulatory Visit: Payer: Self-pay

## 2020-09-16 ENCOUNTER — Ambulatory Visit (INDEPENDENT_AMBULATORY_CARE_PROVIDER_SITE_OTHER): Payer: Federal, State, Local not specified - PPO | Admitting: Internal Medicine

## 2020-09-16 ENCOUNTER — Encounter (INDEPENDENT_AMBULATORY_CARE_PROVIDER_SITE_OTHER): Payer: Self-pay | Admitting: Internal Medicine

## 2020-09-16 VITALS — BP 138/77 | HR 73 | Temp 97.3°F | Resp 18 | Ht 60.0 in | Wt 146.0 lb

## 2020-09-16 DIAGNOSIS — N941 Unspecified dyspareunia: Secondary | ICD-10-CM | POA: Diagnosis not present

## 2020-09-16 DIAGNOSIS — E039 Hypothyroidism, unspecified: Secondary | ICD-10-CM | POA: Diagnosis not present

## 2020-09-16 DIAGNOSIS — R7303 Prediabetes: Secondary | ICD-10-CM | POA: Diagnosis not present

## 2020-09-16 DIAGNOSIS — I10 Essential (primary) hypertension: Secondary | ICD-10-CM

## 2020-09-16 DIAGNOSIS — E282 Polycystic ovarian syndrome: Secondary | ICD-10-CM | POA: Diagnosis not present

## 2020-09-16 MED ORDER — EC-RX TESTOSTERONE 0.2 % TD CREA: 5.0000 mg | TOPICAL_CREAM | Freq: Every day | TRANSDERMAL | 3 refills | Status: DC

## 2020-09-16 NOTE — Progress Notes (Signed)
Metrics: Intervention Frequency ACO  Documented Smoking Status Yearly  Screened one or more times in 24 months  Cessation Counseling or  Active cessation medication Past 24 months  Past 24 months   Guideline developer: UpToDate (See UpToDate for funding source) Date Released: 2014       Wellness Office Visit  Subjective:  Patient ID: Mackenzie Mcpherson, female    DOB: [birth year]  Age: 70 y.o. MRN: [id number]  CC: FU HPI  Tolerated higher dose of NP THYROID. All other medications the same. Working on diet and losing weight. Past Medical History:  Diagnosis Date  . Diverticulitis 2010  . GERD (gastroesophageal reflux disease)    errosive esophagitis in 2010  . Heart attack (HCC)    spasm of RCA  . Hemorrhoids   . Hiatal hernia   . Hypertension   . Hypothyroidism   . Schatzki's ring    last dilation 2010   Past Surgical History:  Procedure Laterality Date  . ABDOMINAL HYSTERECTOMY  1986   w/ unilateral salpingoopherectomy (can't remember which one)  . APPENDECTOMY    . CHOLECYSTECTOMY    . COLONOSCOPY  11/19/2004   Normal rectum/Normal colon  . COLONOSCOPY  12/11/09   Rourk-minimally friable anal canal/hemorrhoids otherwise normal  . COLONOSCOPY N/A 02/11/2015   rourk: normal. next exam in 5 years.  . COLONOSCOPY N/A 08/12/2020   Procedure: COLONOSCOPY;  Surgeon: Daneil Dolin, MD;  [facility];  Service: Endoscopy;  Laterality: N/A;  AM  . ESOPHAGOGASTRODUODENOSCOPY  08/15/2008   Moderate size hiatal hernia/Noncritical Schatzki's ring with superimposed distal esophageal erosion consistent with erosive reflux esophagitis  . ESOPHAGOGASTRODUODENOSCOPY N/A 06/11/2014   [id number] ring dilated/HH  . LEFT HEART CATH AND CORONARY ANGIOGRAPHY N/A 05/11/2018   Procedure: LEFT HEART CATH AND CORONARY ANGIOGRAPHY;  Surgeon: Dixie Dials, MD;  Location: [facility];  Service: Cardiovascular;  Laterality: N/A;  . POLYPECTOMY  08/12/2020   Procedure:  POLYPECTOMY;  Surgeon: Daneil Dolin, MD;  [facility];  Service: Endoscopy;;     Family History  Problem Relation Age of Onset  . Hypertension Father   . Stroke Mother   . Diabetes Mother   . Colon cancer Brother 41  . Colon cancer Brother 8  . Prostate cancer Brother     Social History   Social History Narrative   Married for 39 years.[employer].   Social History   Tobacco Use  . Smoking status: Never Smoker  . Smokeless tobacco: Never Used  Substance Use Topics  . Alcohol use: No    Alcohol/week: 0.0 standard drinks    Current Meds  Medication Sig  . aspirin EC 81 MG EC tablet Take 1 tablet (81 mg total) by mouth daily.  Marland Kitchen BLACK CURRANT SEED OIL PO Take 1 drop by mouth daily. Liquid  . calcium carbonate (TUMS - DOSED IN MG ELEMENTAL CALCIUM) 500 MG chewable tablet Chew 500-1,000 mg by mouth 2 (two) times daily as needed for indigestion or heartburn.  . Cholecalciferol (VITAMIN D3) 5000 UNITS TABS Take 5,000 Units by mouth daily.  Marland Kitchen EC-RX Testosterone 0.2 % CREA Place 5 mg onto the skin daily.  Marland Kitchen estradiol (ESTRACE) 1 MG tablet TAKE 1 TABLET(1 MG) BY MOUTH DAILY  . fluticasone (FLONASE) 50 MCG/ACT nasal spray Place 1-2 sprays into both nostrils daily as needed for allergies or rhinitis.  . Ginger, Zingiber officinalis, (GINGER PO) Take 1 tablet by mouth 2 (two) times a week.  Marland Kitchen  losartan-hydrochlorothiazide (HYZAAR) 100-12.5 MG tablet TAKE 1 TABLET BY MOUTH EVERY DAY (Patient taking differently: Take 1 tablet by mouth daily.)  . metoprolol tartrate (LOPRESSOR) 25 MG tablet TAKE 1/2 TABLET BY MOUTH TWICE DAILY (Patient taking differently: 12.5 mg 2 (two) times daily.)  . Multiple Vitamin (MULTIVITAMIN WITH MINERALS) TABS tablet Take 2 tablets by mouth daily.  . Na Sulfate-K Sulfate-Mg Sulf (SUPREP BOWEL PREP KIT) 17.5-3.13-1.6 GM/177ML SOLN Take 1 kit by mouth as directed.  . nitroGLYCERIN (NITROSTAT) 0.4 MG SL tablet Place 1 tablet (0.4 mg  total) under the tongue every 5 (five) minutes x 3 doses as needed for chest pain.  . Omega-3 Fatty Acids (FISH OIL) 1000 MG CAPS Take 1,000 mg by mouth daily.  Vladimir Faster Glycol-Propyl Glycol (SYSTANE OP) Place 1 drop into both eyes daily as needed (dry eyes).  . Probiotic Product (PROBIOTIC PO) Take 1 capsule by mouth daily.  . progesterone (PROMETRIUM) 100 MG capsule TAKE 1 CAPSULE(100 MG) BY MOUTH DAILY (Patient taking differently: Take 100 mg by mouth daily.)  . rosuvastatin (CRESTOR) 40 MG tablet TAKE 1 TABLET(40 MG) BY MOUTH DAILY (Patient taking differently: Take 40 mg by mouth daily at 6 PM.)  . sodium chloride (OCEAN) 0.65 % SOLN nasal spray Place 1 spray into both nostrils at bedtime as needed for congestion.  Marland Kitchen thyroid (NP THYROID) 90 MG tablet Take 1 tablet (90 mg total) by mouth daily.  . TURMERIC PO Take 1 capsule by mouth daily.  Marland Kitchen VITAMIN E PO Take 1 capsule by mouth daily.  . [DISCONTINUED] Testosterone Propionate (FIRST-TESTOSTERONE MC) 2 % CREA Place 5 mg onto the skin daily.     [facility] Office Visit from 05/25/2020 in [facility]  PHQ-9 Total Score 0      Objective:   Today's Vitals: BP 138/77 (BP Location: Right Arm, Patient Position: Sitting, Cuff Size: Normal)   Pulse 73   Temp (!) 97.3 F (36.3 C) (Temporal)   Resp 18   Ht 5' (1.524 m)   Wt 146 lb (66.2 kg)   SpO2 99%   BMI 28.51 kg/m  Vitals with BMI 09/16/2020 08/12/2020 08/12/2020  Height _0  - -  Weight 146 lbs - -  BMI 16.10 - -  Systolic 960 454 098  Diastolic 77 48 83  Pulse 73 58 64     Physical Exam   Has lost 7lbs since last visit.    Assessment   1. Dyspareunia in female   2. PCOS (polycystic ovarian syndrome)   3. Hypothyroidism, adult   4. Prediabetes   5. Essential hypertension, benign       Tests ordered Orders Placed This Encounter  Procedures  . T3, free  . TSH  . Basic metabolic panel  . Hemoglobin A1c     Plan: 1. Continue with  Testosterone-called in a refill. 2. Continue with NP THYROID 41m daily-may need to adjust depending on results. 3. Annual physical 3 months.   Meds ordered this encounter  Medications  . EC-RX Testosterone 0.2 % CREA    Sig: Place 5 mg onto the skin daily.    Dispense:  30 g    Refill:  3    Gearl Kimbrough CLuther Parody MD

## 2020-09-17 ENCOUNTER — Other Ambulatory Visit (INDEPENDENT_AMBULATORY_CARE_PROVIDER_SITE_OTHER): Payer: Self-pay | Admitting: Internal Medicine

## 2020-09-17 LAB — HEMOGLOBIN A1C
Hgb A1c MFr Bld: 6 % of total Hgb — ABNORMAL HIGH (ref <5.7)
Mean Plasma Glucose: 126 mg/dL
eAG (mmol/L): 7 mmol/L

## 2020-09-17 LAB — BASIC METABOLIC PANEL
BUN: 13 mg/dL (ref 7–25)
CO2: 31 mmol/L (ref 20–32)
Calcium: 9.6 mg/dL (ref 8.6–10.4)
Chloride: 102 mmol/L (ref 98–110)
Creat: 0.77 mg/dL (ref 0.60–0.93)
Glucose, Bld: 82 mg/dL (ref 65–139)
Potassium: 4.2 mmol/L (ref 3.5–5.3)
Sodium: 140 mmol/L (ref 135–146)

## 2020-09-17 LAB — TSH: TSH: 0.04 mIU/L — ABNORMAL LOW (ref 0.40–4.50)

## 2020-09-17 LAB — T3, FREE: T3, Free: 4.1 pg/mL (ref 2.3–4.2)

## 2020-09-17 MED ORDER — NP THYROID 120 MG PO TABS
120.0000 mg | ORAL_TABLET | Freq: Every day | ORAL | 3 refills | Status: DC
Start: 2020-09-17 — End: 2022-05-17

## 2020-10-06 ENCOUNTER — Other Ambulatory Visit (INDEPENDENT_AMBULATORY_CARE_PROVIDER_SITE_OTHER): Payer: Self-pay | Admitting: Nurse Practitioner

## 2020-10-30 ENCOUNTER — Other Ambulatory Visit (INDEPENDENT_AMBULATORY_CARE_PROVIDER_SITE_OTHER): Payer: Self-pay | Admitting: Internal Medicine

## 2020-11-27 ENCOUNTER — Other Ambulatory Visit (INDEPENDENT_AMBULATORY_CARE_PROVIDER_SITE_OTHER): Payer: Self-pay | Admitting: Internal Medicine

## 2020-11-30 ENCOUNTER — Ambulatory Visit
Admission: EM | Admit: 2020-11-30 | Discharge: 2020-11-30 | Disposition: A | Discharge status: Home / Self Care | Payer: Federal, State, Local not specified - PPO | Attending: Family Medicine | Admitting: Family Medicine

## 2020-11-30 ENCOUNTER — Encounter: Payer: Self-pay | Admitting: Emergency Medicine

## 2020-11-30 ENCOUNTER — Other Ambulatory Visit (INDEPENDENT_AMBULATORY_CARE_PROVIDER_SITE_OTHER): Payer: Self-pay | Admitting: Nurse Practitioner

## 2020-11-30 ENCOUNTER — Other Ambulatory Visit: Payer: Self-pay

## 2020-11-30 DIAGNOSIS — Z20822 Contact with and (suspected) exposure to covid-19: Secondary | ICD-10-CM

## 2020-11-30 NOTE — ED Provider Notes (Signed)
[facility]    CSN: [id number] Arrival date & time: 11/30/20  0954      History   Chief Complaint No chief complaint on file.   HPI Mackenzie Mcpherson is a 70 y.o. female.   HPI Patient presents today for COVID testing.  Patient has had intermittent scratchy throat and mild headache since her husband tested positive the latter part of July.  Patient is taken multiple home COVID test which all have been negative.  She denies any troublesome symptoms of chest pain, shortness of breath or generalized weakness.  She has been afebrile.  Past Medical History:  Diagnosis Date   Diverticulitis 2010   GERD (gastroesophageal reflux disease)    errosive esophagitis in 2010   Heart attack ([place])    spasm of RCA   Hemorrhoids    Hiatal hernia    Hypertension    Hypothyroidism    Schatzki's ring    last dilation 2010    Patient Active Problem List   Diagnosis Date Noted   Acute coronary syndrome ([place]) 05/11/2018   Chest pain 05/10/2018   Flatus 03/15/2018   Family hx of colon cancer    Family history of colon cancer 12/22/2014   Hiatal hernia    Dysphagia 05/20/2014   Schatzki's ring 05/20/2014   Diverticulitis 12/21/2011   Abdominal pain 12/21/2011   Diarrhea 12/21/2011   ARM PAIN, RIGHT 06/17/2010   MEDIAL EPICONDYLITIS 05/05/2010   LATERAL EPICONDYLITIS 05/05/2010   BLOOD IN STOOL 11/18/2009   OBESITY, UNSPECIFIED 07/30/2008   GERD 07/30/2008   DYSPHAGIA PHARYNGEAL PHASE 07/30/2008   HYPERTENSION, HX OF 07/30/2008    Past Surgical History:  Procedure Laterality Date   ABDOMINAL HYSTERECTOMY  1986   w/ unilateral salpingoopherectomy (can't remember which one)   APPENDECTOMY     CHOLECYSTECTOMY     COLONOSCOPY  11/19/2004   Normal rectum/Normal colon   COLONOSCOPY  12/11/09   Rourk-minimally friable anal canal/hemorrhoids otherwise normal   COLONOSCOPY N/A 02/11/2015   rourk: normal. next exam in 5 years.   COLONOSCOPY N/A 08/12/2020   Procedure:  COLONOSCOPY;  Surgeon: Daneil Dolin, MD;  [facility];  Service: Endoscopy;  Laterality: N/A;  AM   ESOPHAGOGASTRODUODENOSCOPY  08/15/2008   Moderate size hiatal hernia/Noncritical Schatzki's ring with superimposed distal esophageal erosion consistent with erosive reflux esophagitis   ESOPHAGOGASTRODUODENOSCOPY N/A 06/11/2014   [id number] ring dilated/HH   LEFT HEART CATH AND CORONARY ANGIOGRAPHY N/A 05/11/2018   Procedure: LEFT HEART CATH AND CORONARY ANGIOGRAPHY;  Surgeon: Dixie Dials, MD;  Location: [facility];  Service: Cardiovascular;  Laterality: N/A;   POLYPECTOMY  08/12/2020   Procedure: POLYPECTOMY;  Surgeon: Daneil Dolin, MD;  [facility];  Service: Endoscopy;;    OB History   No obstetric history on file.      Home Medications    Prior to Admission medications   Medication Sig Start Date End Date Taking? Authorizing Provider  aspirin EC 81 MG EC tablet Take 1 tablet (81 mg total) by mouth daily. 05/12/18   Dixie Dials, MD  BLACK CURRANT SEED OIL PO Take 1 drop by mouth daily. Liquid    [provider]  calcium carbonate (TUMS - DOSED IN MG ELEMENTAL CALCIUM) 500 MG chewable tablet Chew 500-1,000 mg by mouth 2 (two) times daily as needed for indigestion or heartburn.    [provider]  Cholecalciferol (VITAMIN D3) 5000 UNITS TABS Take 5,000 Units by mouth daily.  [provider]  EC-RX Testosterone 0.2 % CREA Place 5 mg onto the skin daily. 09/16/20   Doree Albee, MD  estradiol (ESTRACE) 1 MG tablet TAKE 1 TABLET(1 MG) BY MOUTH DAILY 08/14/20   Hurshel Party C, MD  fluticasone (FLONASE) 50 MCG/ACT nasal spray Place 1-2 sprays into both nostrils daily as needed for allergies or rhinitis.    [provider]  Ginger, Zingiber officinalis, (GINGER PO) Take 1 tablet by mouth 2 (two) times a week.    [provider]  losartan-hydrochlorothiazide (HYZAAR) 100-12.5 MG tablet TAKE 1 TABLET BY  MOUTH EVERY DAY Patient taking differently: Take 1 tablet by mouth daily. 06/19/20   Doree Albee, MD  metoprolol tartrate (LOPRESSOR) 25 MG tablet TAKE 1/2 TABLET BY MOUTH TWICE DAILY Patient taking differently: 12.5 mg 2 (two) times daily. 06/03/20   Doree Albee, MD  Multiple Vitamin (MULTIVITAMIN WITH MINERALS) TABS tablet Take 2 tablets by mouth daily.    [provider]  Na Sulfate-K Sulfate-Mg Sulf (SUPREP BOWEL PREP KIT) 17.5-3.13-1.6 GM/177ML SOLN Take 1 kit by mouth as directed. 04/28/20   Rourk, Cristopher Estimable, MD  nitroGLYCERIN (NITROSTAT) 0.4 MG SL tablet Place 1 tablet (0.4 mg total) under the tongue every 5 (five) minutes x 3 doses as needed for chest pain. 05/11/18   Dixie Dials, MD  NP THYROID 120 MG tablet Take 1 tablet (120 mg total) by mouth daily before breakfast. 09/17/20   Doree Albee, MD  Omega-3 Fatty Acids (FISH OIL) 1000 MG CAPS Take 1,000 mg by mouth daily.    [provider]  Polyethyl Glycol-Propyl Glycol (SYSTANE OP) Place 1 drop into both eyes daily as needed (dry eyes).    [provider]  Probiotic Product (PROBIOTIC PO) Take 1 capsule by mouth daily.    [provider]  progesterone (PROMETRIUM) 100 MG capsule Take 1 capsule (100 mg total) by mouth daily. 11/02/20   Ailene Ards, NP  rosuvastatin (CRESTOR) 40 MG tablet TAKE 1 TABLET(40 MG) BY MOUTH DAILY 10/06/20   Hurshel Party C, MD  sodium chloride (OCEAN) 0.65 % SOLN nasal spray Place 1 spray into both nostrils at bedtime as needed for congestion.    [provider]  TURMERIC PO Take 1 capsule by mouth daily.    [provider]  VITAMIN E PO Take 1 capsule by mouth daily.    [provider]    Family History Family History  Problem Relation Age of Onset   Hypertension Father    Stroke Mother    Diabetes Mother    Colon cancer Brother 36   Colon cancer Brother 25   Prostate cancer Brother     Social History Social History    Tobacco Use   Smoking status: Never   Smokeless tobacco: Never  Vaping Use   Vaping Use: Never used  Substance Use Topics   Alcohol use: No    Alcohol/week: 0.0 standard drinks   Drug use: No     Allergies   Pantoprazole sodium   Review of Systems Review of Systems Pertinent negatives listed in HPI   Physical Exam Triage Vital Signs ED Triage Vitals  Enc Vitals Group     BP 11/30/20 1006 (!) 149/68     Pulse Rate 11/30/20 1006 63     Resp 11/30/20 1006 16     Temp 11/30/20 1006 98.2 F (36.8 C)     Temp Source 11/30/20 1006 Temporal  SpO2 11/30/20 1006 97 %     Weight --      Height --      Head Circumference --      Peak Flow --      Pain Score 11/30/20 1008 0     Pain Loc --      Pain Edu? --      Excl. in [place]? --    No data found.  Updated Vital Signs BP (!) 149/68 (BP Location: Right Arm)   Pulse 63   Temp 98.2 F (36.8 C) (Temporal)   Resp 16   SpO2 97%   Visual Acuity Right Eye Distance:   Left Eye Distance:   Bilateral Distance:    Right Eye Near:   Left Eye Near:    Bilateral Near:     Physical Exam General Appearance:    Alert, cooperative, no distress  HENT:   ENT exam normal, no neck nodes or sinus tenderness  Eyes:    PERRL, conjunctiva/corneas clear, EOM's intact       Lungs:     Clear to auscultation bilaterally, respirations unlabored  Heart:    Regular rate and rhythm  Neurologic:   Awake, alert, oriented x 3. No apparent focal neurological           defect.        UC Treatments / Results  Labs (all labs ordered are listed, but only abnormal results are displayed) Labs Reviewed  NOVEL CORONAVIRUS, NAA    EKG   Radiology No results found.  Procedures Procedures (including critical care time)  Medications Ordered in UC Medications - No data to display  Initial Impression / Assessment and Plan / UC Course  I have reviewed the triage vital signs and the nursing notes.  Pertinent labs & imaging results that  were available during my care of the patient were reviewed by me and considered in my medical decision making (see chart for details).     COVID/Flu test pending. Symptom management warranted only.  Manage fever with Tylenol and ibuprofen.  Nasal symptoms with over-the-counter antihistamines recommended.  Treatment per discharge medications/discharge instructions.  Red flags/ER precautions given. The most current CDC isolation/quarantine recommendation advised.   Final Clinical Impressions(s) / UC Diagnoses   Final diagnoses:  Exposure to confirmed case of COVID-19     Discharge Instructions      Your COVID 19 results should result within 2-4 days. Negative results are immediately resulted to Mychart. Positive results will receive a follow-up call from our clinic. If symptoms are present, I recommend home quarantine until results are known.  Alternate Tylenol and ibuprofen as needed for body aches and fever.  Symptom management per recommendations discussed today.  If any breathing difficulty or chest pain develops go immediately to the closest emergency department for evaluation.      ED Prescriptions   None    PDMP not reviewed this encounter.   Scot Jun, FNP 11/30/20 1157

## 2020-11-30 NOTE — Discharge Instructions (Addendum)
Your COVID 19 results should result within 2-4 days. Negative results are immediately resulted to Mychart. Positive results will receive a follow-up call from our clinic. If symptoms are present, I recommend home quarantine until results are known.  Alternate Tylenol and ibuprofen as needed for body aches and fever.  Symptom management per recommendations discussed today.  If any breathing difficulty or chest pain develops go immediately to the closest emergency department for evaluation.

## 2020-11-30 NOTE — ED Triage Notes (Signed)
Headache,  exposed to husband that was positive for covid.

## 2020-12-01 LAB — SARS-COV-2, NAA 2 DAY TAT

## 2020-12-01 LAB — NOVEL CORONAVIRUS, NAA: SARS-CoV-2, NAA: NOT DETECTED

## 2020-12-06 IMAGING — MG MM DIGITAL DIAGNOSTIC UNILAT*L* W/ TOMO W/ CAD
4 series · 4 of 12 positions shown · non-contrast
Comparison: [facility] 10/13/2019 and earlier studies

CLINICAL DATA: Patient returns after screening study for evaluation
of a possible LEFT breast mass.

EXAM:
DIGITAL DIAGNOSTIC LEFT MAMMOGRAM WITH CAD AND TOMO
ULTRASOUND LEFT BREAST

[L MLO synth-2D]
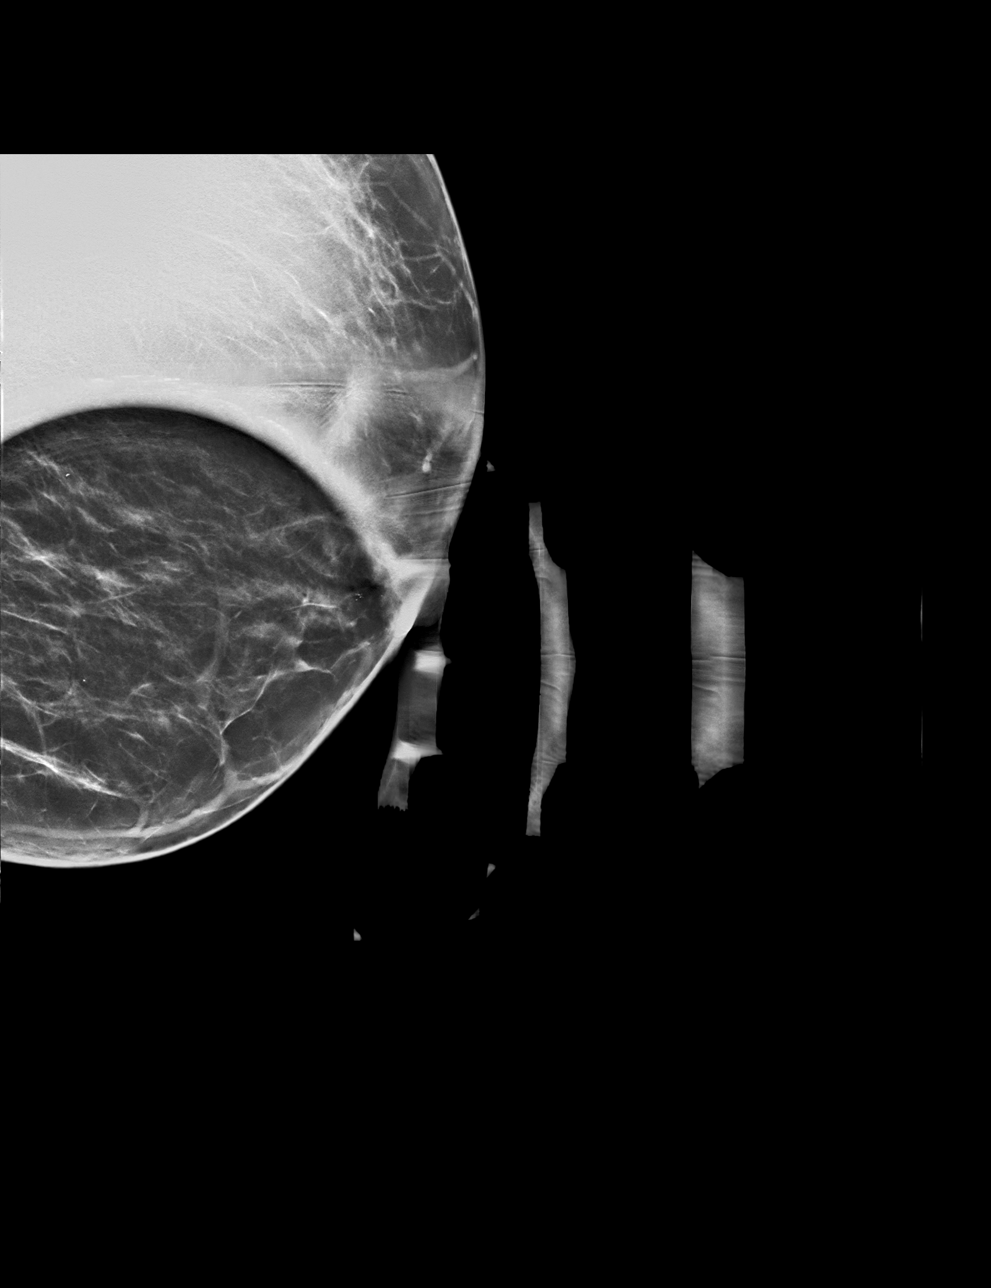

[L CC synth-2D]
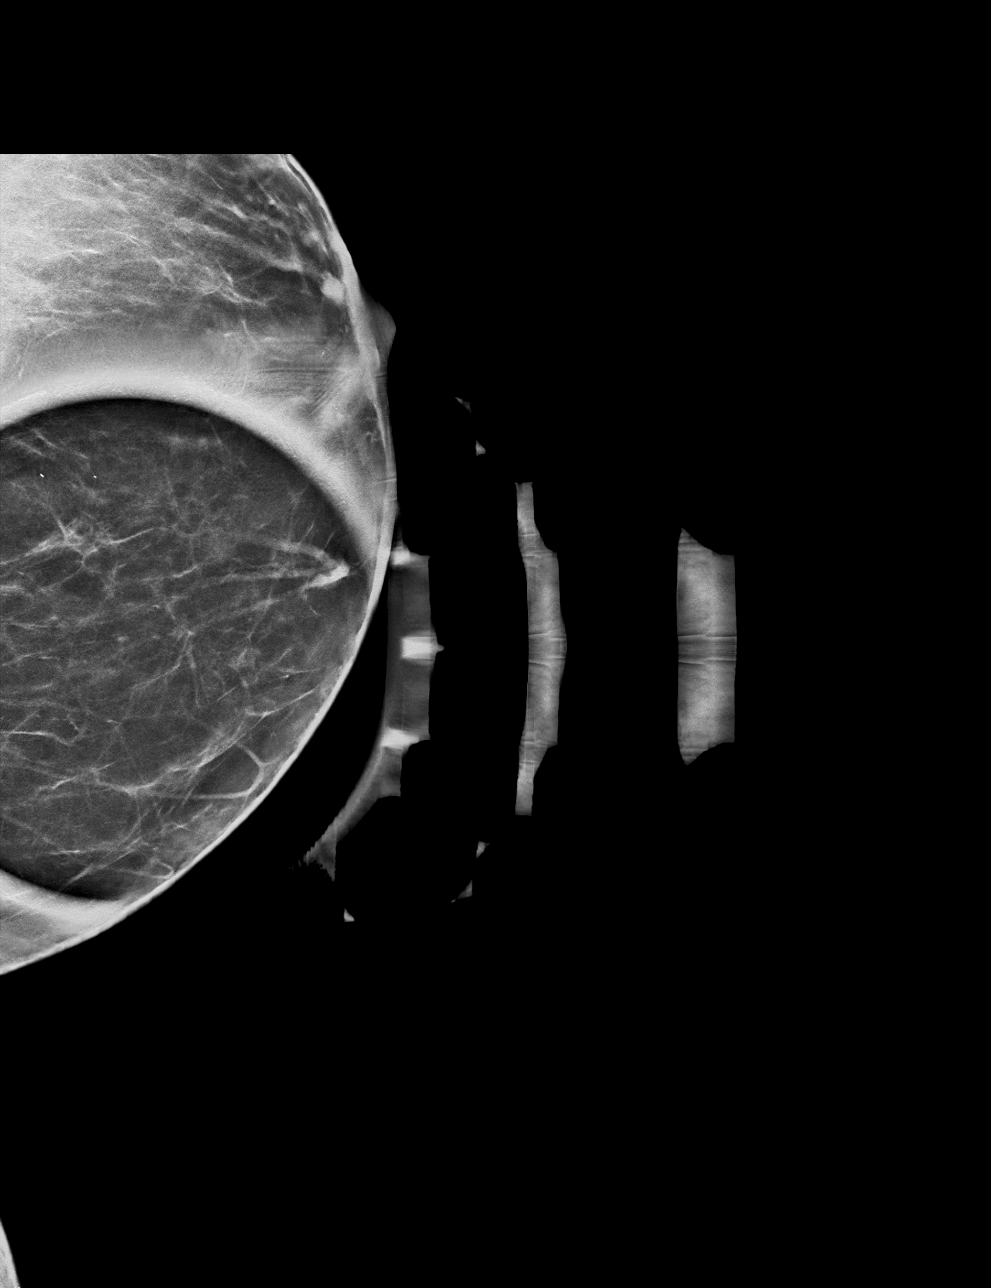

[L CC tomo · tomo slice 29/56.0]
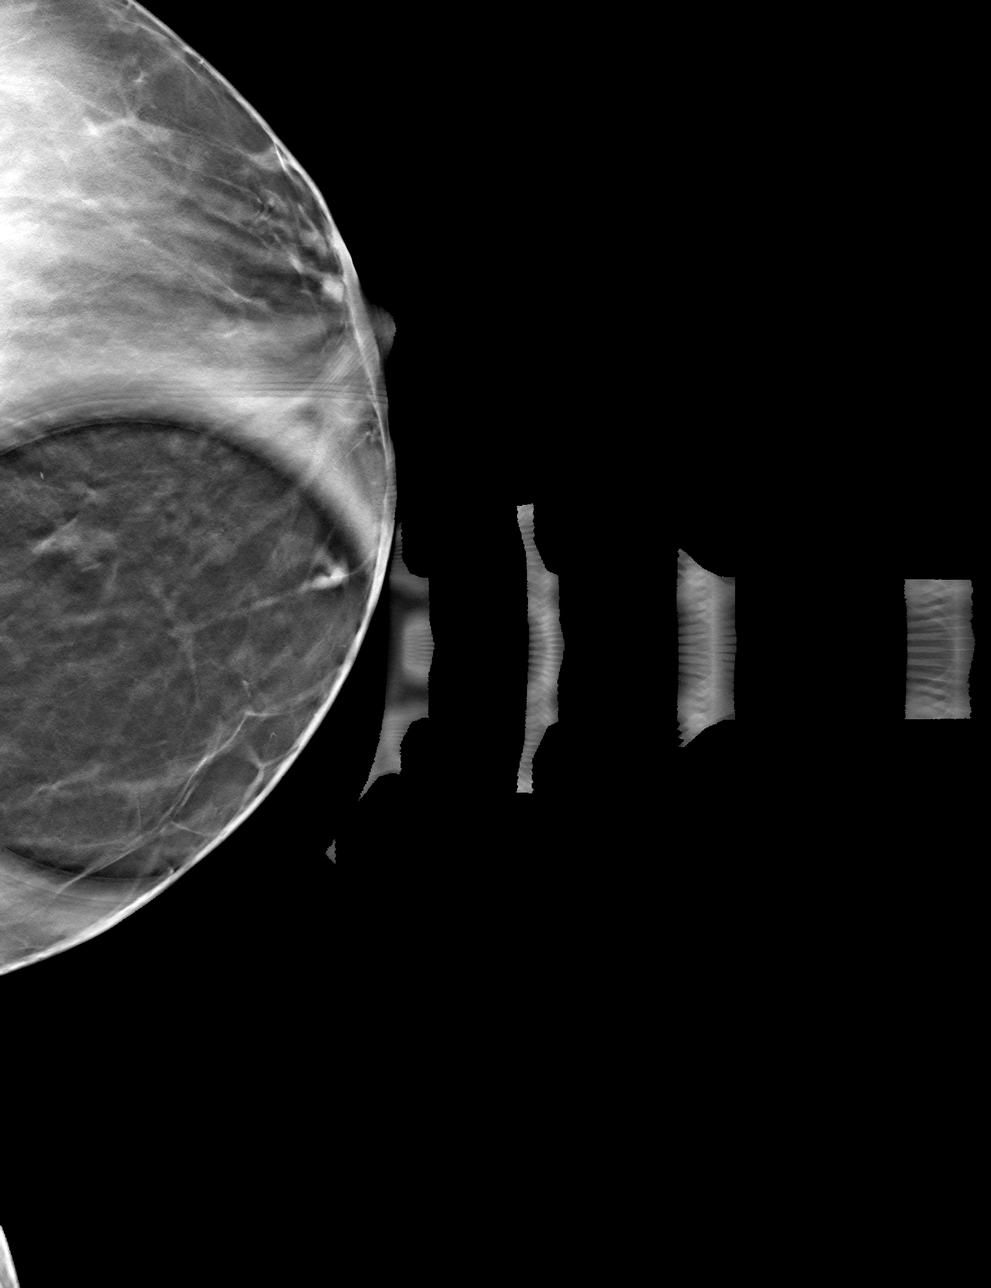

[L MLO tomo · tomo slice 31/61.0]
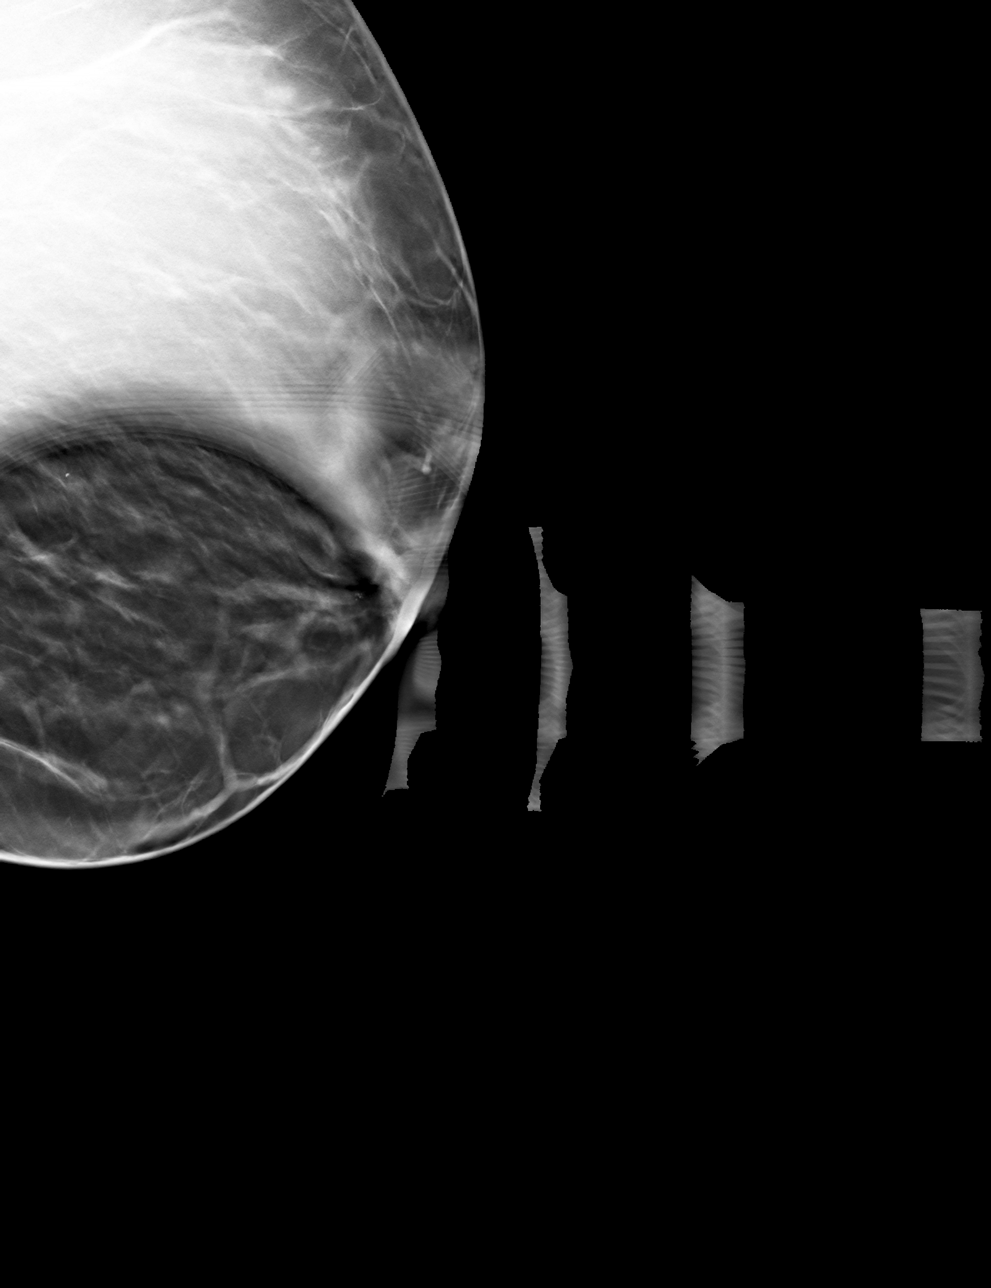

[4 of 12 positions shown; findings below may reference images not displayed]

ACR Breast Density Category b: There are scattered areas of
fibroglandular density.
FINDINGS: Additional 2-D and 3-D images are performed. These views confirm
presence of a circumscribed oval mass in the MEDIAL aspect of the
LEFT breast and further evaluated with ultrasound.

Mammographic images were processed with CAD.

Targeted ultrasound is performed, showing a simple cyst in the 9
o'clock location of the LEFT breast 4 centimeters from the nipple
which measures 0.6 x 0.2 x 0.3 centimeters. No solid component or
areas of acoustic shadowing.
IMPRESSION: Benign cysts in the LEFT breast accounts for the mammographic
finding. No mammographic or ultrasound evidence for malignancy.

RECOMMENDATION:
Screening mammogram in one year.(Code:[id number])

I have discussed the findings and recommendations with the patient.
If applicable, a reminder letter will be sent to the patient
regarding the next appointment.

BI-RADS CATEGORY  2: Benign.

## 2020-12-09 ENCOUNTER — Other Ambulatory Visit (INDEPENDENT_AMBULATORY_CARE_PROVIDER_SITE_OTHER): Payer: Self-pay

## 2020-12-09 DIAGNOSIS — E785 Hyperlipidemia, unspecified: Secondary | ICD-10-CM

## 2020-12-09 DIAGNOSIS — R079 Chest pain, unspecified: Secondary | ICD-10-CM

## 2020-12-09 DIAGNOSIS — I10 Essential (primary) hypertension: Secondary | ICD-10-CM

## 2020-12-10 ENCOUNTER — Telehealth (INDEPENDENT_AMBULATORY_CARE_PROVIDER_SITE_OTHER): Payer: Self-pay

## 2020-12-10 DIAGNOSIS — Z780 Asymptomatic menopausal state: Secondary | ICD-10-CM

## 2020-12-10 MED ORDER — ROSUVASTATIN CALCIUM 40 MG PO TABS: 40.0000 mg | ORAL_TABLET | Freq: Every day | ORAL | 0 refills | Status: AC

## 2020-12-10 MED ORDER — NITROGLYCERIN 0.4 MG SL SUBL: 0.4000 mg | SUBLINGUAL_TABLET | SUBLINGUAL | 1 refills | Status: AC | PRN

## 2020-12-10 MED ORDER — ESTRADIOL 1 MG PO TABS: 1.0000 mg | ORAL_TABLET | Freq: Every day | ORAL | 3 refills | Status: DC

## 2020-12-10 MED ORDER — LOSARTAN POTASSIUM-HCTZ 100-12.5 MG PO TABS: 1.0000 | ORAL_TABLET | Freq: Every day | ORAL | 0 refills | Status: AC

## 2020-12-10 NOTE — Telephone Encounter (Signed)
Received a refill request on the following medication from [facility]:  estradiol (ESTRACE) 1 MG tablet  Last filled 08/14/2020, # 30 with 3 refills

## 2020-12-14 ENCOUNTER — Encounter: Payer: Self-pay | Admitting: Orthopedic Surgery

## 2020-12-14 ENCOUNTER — Telehealth: Payer: Self-pay | Admitting: Orthopedic Surgery

## 2020-12-14 ENCOUNTER — Ambulatory Visit: Payer: Federal, State, Local not specified - PPO | Admitting: Orthopedic Surgery

## 2020-12-14 ENCOUNTER — Other Ambulatory Visit: Payer: Self-pay

## 2020-12-14 VITALS — BP 129/61 | HR 83 | Ht 60.0 in | Wt 145.0 lb

## 2020-12-14 DIAGNOSIS — M6788 Other specified disorders of synovium and tendon, other site: Secondary | ICD-10-CM

## 2020-12-14 NOTE — Progress Notes (Signed)
Established patient new problem   Chief Complaint  Patient presents with   Ankle Pain    Right achilles painful has a bump for a couple weeks no injury     70 yo female h/o pain swelling right achilles   No obvious trauma   She noticed pain when she went from her normal heeled shoe to a flat shoe    Body mass index is 28.32 kg/m.  BP 129/61   Pulse 83   Ht 5' (1.524 m)   Wt 145 lb (65.8 kg)   BMI 28.32 kg/m   Past Medical History:  Diagnosis Date   Diverticulitis 2010   GERD (gastroesophageal reflux disease)    errosive esophagitis in 2010   Heart attack ([place])    spasm of RCA   Hemorrhoids    Hiatal hernia    Hypertension    Hypothyroidism    Schatzki's ring    last dilation 2010    Physical Exam  Constitutional: Development normal, nutrition normal, body habitus normal  Mental status oriented x3 mood and affect no depression Cardiovascular pulses and temperature normal   Gait normal   Musculoskeletal:  Inspection: right achilles above the insertionnodular area of tissue that is tender Range of motion: Normal with pain on dorsiflexion Stability: Ankle joint normal Muscle strength and tone: No weakness normal Thompson test  Skin warm dry and intact no erythema  Neurological sensation normal  Assessment and plan:  Achilles tendinosis recommend ibuprofen 2 times a day for 100 mg and then wear elevated heeled shoe follow-up in 4 weeks  Encounter Diagnosis  Name Primary?   Achilles tendonosis of right lower extremity Yes

## 2020-12-14 NOTE — Patient Instructions (Signed)
2 ibuprofen 2 x a day   Wear elevated heeled shoe

## 2020-12-14 NOTE — Telephone Encounter (Signed)
Patient called back and wants to know if she should cancel her stress test for tomorrow and wait till her ankle is better.    Please call her back and let her know asap, because she needs to give them time to cancel it .  Her number is [phone]

## 2020-12-15 NOTE — Telephone Encounter (Signed)
I called her to advise / 

## 2020-12-24 ENCOUNTER — Ambulatory Visit (INDEPENDENT_AMBULATORY_CARE_PROVIDER_SITE_OTHER): Payer: Federal, State, Local not specified - PPO | Admitting: Internal Medicine

## 2020-12-29 ENCOUNTER — Other Ambulatory Visit (INDEPENDENT_AMBULATORY_CARE_PROVIDER_SITE_OTHER): Payer: Self-pay | Admitting: Nurse Practitioner

## 2021-01-11 ENCOUNTER — Ambulatory Visit: Payer: Federal, State, Local not specified - PPO | Admitting: Orthopedic Surgery

## 2021-01-11 ENCOUNTER — Encounter: Payer: Self-pay | Admitting: Orthopedic Surgery

## 2021-01-11 ENCOUNTER — Other Ambulatory Visit: Payer: Self-pay

## 2021-01-11 VITALS — BP 144/75 | HR 67 | Ht 60.0 in | Wt 145.0 lb

## 2021-01-11 DIAGNOSIS — M6788 Other specified disorders of synovium and tendon, other site: Secondary | ICD-10-CM | POA: Diagnosis not present

## 2021-01-11 NOTE — Patient Instructions (Signed)
COMING BACK AFTER LUNCH

## 2021-01-11 NOTE — Progress Notes (Signed)
Encounter Diagnosis  Name Primary?   Achilles tendonosis of right lower extremity Yes    Chief Complaint  Patient presents with   Ankle Pain    Right achilles / pain at times    70 year old female follows up with right Achilles tendinosis and a large pump bump and chronic Achilles tendon nodule  Treatment so far included ibuprofen and elevated heel  Exam of the right heel shows there is a tender large knot in the watershed region no pain with plantar flexion dorsiflexion strength is still normal  We are going to place her in a cam walker  CAM Walker follow-up 6 weeks

## 2021-01-18 DIAGNOSIS — K635 Polyp of colon: Secondary | ICD-10-CM | POA: Insufficient documentation

## 2021-01-18 DIAGNOSIS — R7303 Prediabetes: Secondary | ICD-10-CM | POA: Insufficient documentation

## 2021-01-18 DIAGNOSIS — E785 Hyperlipidemia, unspecified: Secondary | ICD-10-CM | POA: Insufficient documentation

## 2021-01-18 DIAGNOSIS — E039 Hypothyroidism, unspecified: Secondary | ICD-10-CM | POA: Insufficient documentation

## 2021-01-18 DIAGNOSIS — E282 Polycystic ovarian syndrome: Secondary | ICD-10-CM | POA: Insufficient documentation

## 2021-01-18 DIAGNOSIS — I10 Essential (primary) hypertension: Secondary | ICD-10-CM | POA: Insufficient documentation

## 2021-01-19 DIAGNOSIS — M7660 Achilles tendinitis, unspecified leg: Secondary | ICD-10-CM | POA: Insufficient documentation

## 2021-02-22 ENCOUNTER — Other Ambulatory Visit: Payer: Self-pay

## 2021-02-22 ENCOUNTER — Encounter: Payer: Self-pay | Admitting: Orthopedic Surgery

## 2021-02-22 ENCOUNTER — Ambulatory Visit: Payer: Federal, State, Local not specified - PPO | Admitting: Orthopedic Surgery

## 2021-02-22 VITALS — BP 123/63 | HR 70 | Ht 60.0 in | Wt 145.0 lb

## 2021-02-22 DIAGNOSIS — M6788 Other specified disorders of synovium and tendon, other site: Secondary | ICD-10-CM | POA: Diagnosis not present

## 2021-02-22 NOTE — Progress Notes (Signed)
Chief Complaint  Patient presents with   achilles tendonosis of right lower extremity    6 wk follow up   Follow-up for this 70 year old female with chronic Achilles tendinosis and pump bump deformity right lower extremity status post 6 weeks in a cam walker  She is improving though still having difficulty going down the steps  She is less tender over the Achilles tendinosis area as well as the pump bump area  She has normal range of motion no tightness in the Achilles  I recommend that she use the boot for long walks walk the steps not sequentially and return in 3 months

## 2021-02-23 ENCOUNTER — Other Ambulatory Visit (INDEPENDENT_AMBULATORY_CARE_PROVIDER_SITE_OTHER): Payer: Self-pay | Admitting: Nurse Practitioner

## 2021-03-07 ENCOUNTER — Other Ambulatory Visit (INDEPENDENT_AMBULATORY_CARE_PROVIDER_SITE_OTHER): Payer: Self-pay | Admitting: Nurse Practitioner

## 2021-03-07 DIAGNOSIS — I10 Essential (primary) hypertension: Secondary | ICD-10-CM

## 2021-03-29 ENCOUNTER — Other Ambulatory Visit (INDEPENDENT_AMBULATORY_CARE_PROVIDER_SITE_OTHER): Payer: Self-pay | Admitting: Nurse Practitioner

## 2021-04-19 ENCOUNTER — Other Ambulatory Visit (INDEPENDENT_AMBULATORY_CARE_PROVIDER_SITE_OTHER): Payer: Self-pay | Admitting: Nurse Practitioner

## 2021-04-19 DIAGNOSIS — Z780 Asymptomatic menopausal state: Secondary | ICD-10-CM

## 2021-05-04 ENCOUNTER — Other Ambulatory Visit (INDEPENDENT_AMBULATORY_CARE_PROVIDER_SITE_OTHER): Payer: Self-pay | Admitting: Nurse Practitioner

## 2021-05-27 ENCOUNTER — Ambulatory Visit: Payer: Federal, State, Local not specified - PPO | Admitting: Orthopedic Surgery

## 2021-06-08 ENCOUNTER — Other Ambulatory Visit (HOSPITAL_COMMUNITY): Payer: Self-pay | Admitting: Internal Medicine

## 2021-06-08 DIAGNOSIS — Z1231 Encounter for screening mammogram for malignant neoplasm of breast: Secondary | ICD-10-CM

## 2021-06-14 ENCOUNTER — Ambulatory Visit: Payer: Federal, State, Local not specified - PPO | Admitting: Orthopedic Surgery

## 2021-06-14 ENCOUNTER — Other Ambulatory Visit: Payer: Self-pay

## 2021-06-14 DIAGNOSIS — M6788 Other specified disorders of synovium and tendon, other site: Secondary | ICD-10-CM

## 2021-06-14 NOTE — Progress Notes (Signed)
Chief Complaint  Patient presents with   Achilles tendonosis of right lower extremity    Pain is bette   Encounter Diagnosis  Name Primary?   Achilles tendonosis of right lower extremity Yes    Mackenzie Mcpherson is doing well not having any issues right now  She is wearing a good supportive shoe her exam was benign except for the nodule from the chronic tendinosis  She will see [place] on an as-needed basis

## 2021-06-24 ENCOUNTER — Other Ambulatory Visit (INDEPENDENT_AMBULATORY_CARE_PROVIDER_SITE_OTHER): Payer: Self-pay | Admitting: Nurse Practitioner

## 2021-06-24 DIAGNOSIS — E785 Hyperlipidemia, unspecified: Secondary | ICD-10-CM

## 2021-07-05 ENCOUNTER — Other Ambulatory Visit (INDEPENDENT_AMBULATORY_CARE_PROVIDER_SITE_OTHER): Payer: Self-pay | Admitting: Nurse Practitioner

## 2021-07-05 DIAGNOSIS — E785 Hyperlipidemia, unspecified: Secondary | ICD-10-CM

## 2021-07-16 ENCOUNTER — Other Ambulatory Visit: Payer: Self-pay

## 2021-07-16 ENCOUNTER — Ambulatory Visit (HOSPITAL_COMMUNITY)
Admission: RE | Admit: 2021-07-16 | Discharge: 2021-07-16 | Disposition: A | Discharge status: Home / Self Care | Payer: Medicare Other | Source: Ambulatory Visit | Attending: Internal Medicine | Admitting: Internal Medicine

## 2021-07-16 DIAGNOSIS — Z1231 Encounter for screening mammogram for malignant neoplasm of breast: Secondary | ICD-10-CM | POA: Insufficient documentation

## 2021-09-27 ENCOUNTER — Telehealth: Payer: Self-pay

## 2021-09-27 NOTE — Telephone Encounter (Signed)
Patient left voicemail message wanting for Dr. Aline Brochure to call her. She has a question for him.  Please call her at [phone]

## 2021-09-27 NOTE — Telephone Encounter (Signed)
FYI: I called back and he is scheduled.

## 2021-09-27 NOTE — Telephone Encounter (Signed)
I called she is calling about her husband he wants to make an appointment

## 2022-03-14 ENCOUNTER — Other Ambulatory Visit (HOSPITAL_COMMUNITY): Payer: Self-pay | Admitting: Internal Medicine

## 2022-03-14 ENCOUNTER — Ambulatory Visit (HOSPITAL_COMMUNITY)
Admission: RE | Admit: 2022-03-14 | Discharge: 2022-03-14 | Disposition: A | Discharge status: Home / Self Care | Payer: Federal, State, Local not specified - PPO | Source: Ambulatory Visit | Attending: Internal Medicine | Admitting: Internal Medicine

## 2022-03-14 DIAGNOSIS — M25562 Pain in left knee: Secondary | ICD-10-CM | POA: Diagnosis not present

## 2022-04-21 ENCOUNTER — Ambulatory Visit (INDEPENDENT_AMBULATORY_CARE_PROVIDER_SITE_OTHER): Payer: Federal, State, Local not specified - PPO | Admitting: Orthopedic Surgery

## 2022-04-21 ENCOUNTER — Encounter: Payer: Self-pay | Admitting: Orthopedic Surgery

## 2022-04-21 ENCOUNTER — Ambulatory Visit (INDEPENDENT_AMBULATORY_CARE_PROVIDER_SITE_OTHER): Payer: Federal, State, Local not specified - PPO

## 2022-04-21 VITALS — BP 171/93 | HR 69 | Ht 59.75 in | Wt 156.8 lb

## 2022-04-21 DIAGNOSIS — G8929 Other chronic pain: Secondary | ICD-10-CM

## 2022-04-21 DIAGNOSIS — M23322 Other meniscus derangements, posterior horn of medial meniscus, left knee: Secondary | ICD-10-CM | POA: Diagnosis not present

## 2022-04-21 DIAGNOSIS — M25562 Pain in left knee: Secondary | ICD-10-CM

## 2022-04-21 NOTE — Patient Instructions (Signed)
To schedule the MRI please go ahead and call to schedule your appointment with [facility] within at least one (1) week.   Central Scheduling [phone]

## 2022-04-21 NOTE — Progress Notes (Signed)
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  She had to do a lot of walking on a trip during the time at the airport and went to her primary care doctor she took some ibuprofen use the knee brace but did not improve she had an x-ray which was normal  She presents now with tenderness over the medial joint line a positive McMurray's sign a trace joint effusion some pain with range of motion  Her gait is altered with a slight limp  Past Medical History:  Diagnosis Date   Diverticulitis 2010   GERD (gastroesophageal reflux disease)    errosive esophagitis in 2010   Heart attack ([place])    spasm of RCA   Hemorrhoids    Hiatal hernia    Hypertension    Hypothyroidism    Schatzki's ring    last dilation 2010   Past Surgical History:  Procedure Laterality Date   ABDOMINAL HYSTERECTOMY  1986   w/ unilateral salpingoopherectomy (can't remember which one)   APPENDECTOMY     CHOLECYSTECTOMY     COLONOSCOPY  11/19/2004   Normal rectum/Normal colon   COLONOSCOPY  12/11/09   Rourk-minimally friable anal canal/hemorrhoids otherwise normal   COLONOSCOPY N/A 02/11/2015   rourk: normal. next exam in 5 years.   COLONOSCOPY N/A 08/12/2020   Procedure: COLONOSCOPY;  Surgeon: Daneil Dolin, MD;  [facility];  Service: Endoscopy;  Laterality: N/A;  AM   ESOPHAGOGASTRODUODENOSCOPY  08/15/2008   Moderate size hiatal hernia/Noncritical Schatzki's ring with superimposed distal esophageal erosion consistent with erosive reflux esophagitis   ESOPHAGOGASTRODUODENOSCOPY N/A 06/11/2014   [id number] ring dilated/HH   LEFT HEART CATH AND CORONARY ANGIOGRAPHY N/A 05/11/2018   Procedure: LEFT HEART CATH AND CORONARY  ANGIOGRAPHY;  Surgeon: Dixie Dials, MD;  Location: [facility];  Service: Cardiovascular;  Laterality: N/A;   POLYPECTOMY  08/12/2020   Procedure: POLYPECTOMY;  Surgeon: Daneil Dolin, MD;  [facility];  Service: Endoscopy;;   . Encounter Diagnoses  Name Primary?   Acute pain of left knee Yes   Derangement of posterior horn of medial meniscus of left knee     Recommend MRI left knee evaluate torn medial meniscus.  Looks like she may need arthroscopic surgery here

## 2022-04-26 ENCOUNTER — Ambulatory Visit
Admission: RE | Admit: 2022-04-26 | Discharge: 2022-04-26 | Disposition: A | Discharge status: Home / Self Care | Payer: Federal, State, Local not specified - PPO | Source: Ambulatory Visit | Attending: Orthopedic Surgery | Admitting: Orthopedic Surgery

## 2022-04-26 DIAGNOSIS — M25562 Pain in left knee: Secondary | ICD-10-CM | POA: Diagnosis present

## 2022-05-02 ENCOUNTER — Ambulatory Visit: Payer: Federal, State, Local not specified - PPO | Admitting: Orthopedic Surgery

## 2022-05-02 ENCOUNTER — Encounter: Payer: Self-pay | Admitting: Orthopedic Surgery

## 2022-05-02 DIAGNOSIS — M23322 Other meniscus derangements, posterior horn of medial meniscus, left knee: Secondary | ICD-10-CM

## 2022-05-02 DIAGNOSIS — Z01818 Encounter for other preprocedural examination: Secondary | ICD-10-CM | POA: Diagnosis not present

## 2022-05-02 NOTE — Addendum Note (Signed)
Addended byCandice Camp on: 05/02/2022 01:51 PM   Modules accepted: Orders

## 2022-05-02 NOTE — Progress Notes (Signed)
Chief Complaint  Patient presents with   Results    Review MRI    72 year old female pain left knee.  Pain is continually bothering her with pain over the medial joint line throbbing at night difficulty going up and down the steps  I reviewed the MRI with her and we have decided to proceed with a left knee arthroscopy and partial medial meniscectomy  The knee MRI shows a torn medial meniscus there is a displaced meniscal fragment there is also global arthritis around the joint according to my reading.  The procedure has been fully reviewed with the patient; The risks and benefits of surgery have been discussed and explained and understood. Alternative treatment has also been reviewed, questions were encouraged and answered. The postoperative plan is also been reviewed.

## 2022-05-02 NOTE — Patient Instructions (Addendum)
Your surgery will be at [facility] by Dr Aline Brochure  The hospital will contact you with a preoperative appointment to discuss Anesthesia.  Please arrive on time or 15 minutes early for the preoperative appointment, they have a very tight schedule if you are late or do not come in your surgery will be cancelled.  The phone number is [phone]. Please bring your medications with you for the appointment. They will tell you the arrival time and medication instructions when you have your preoperative evaluation. Do not wear nail polish the day of your surgery and if you take Phentermine you need to stop this medication ONE WEEK prior to your surgery. If you take Augustina Mood, Jardiance, or Steglatro) - Hold 72 hours before the procedure.  If you take Ozempic,  Bydureon or Trulicity do not take for 8 days before your surgery. If you take Victoza, Rybelsis, Saxenda or Adlyxi stop 24 hours before the procedure.  Please arrive at the hospital 2 hours before procedure if scheduled at 9:30 or later in the day or at the time the nurse tells you at your preoperative visit.   If you have my chart do not use the time given in my chart use the time given to you by the nurse during your preoperative visit.   Your surgery  time may change. Please be available for phone calls the day of your surgery and the day before. The Short Stay department may need to discuss changes about your surgery time. Not reaching the you could lead to procedure delays and possible cancellation.  You must have a ride home and someone to stay with you for 24 to 48 hours. The person taking you home will receive and sign for the your discharge instructions.  Please be prepared to give your support person's name and telephone number to Central [employer]. Dr Aline Brochure will need that name and phone number post procedure.    Meniscus Injury, Arthroscopy   Arthroscopy is a surgical procedure that involves the use of a small scope that  has a camera and surgical instruments on the end (arthroscope). An arthroscope can be used to repair your meniscus injury.  LET [facility] CARE PROVIDER KNOW ABOUT: Any allergies you have. All medicines you are taking, including vitamins, herbs, eyedrops, creams, and over-the-counter medicines. Any recent colds or infections you have had or currently have. Previous problems you or members of your family have had with the use of anesthetics. Any blood disorders or blood clotting problems you have. Previous surgeries you have had. Medical conditions you have. RISKS AND COMPLICATIONS Generally, this is a safe procedure. However, as with any procedure, problems can occur. Possible problems include: Damage to nerves or blood vessels. Excess bleeding. Blood clots. Infection. BEFORE THE PROCEDURE Do not eat or drink for 6-8 hours before the procedure. Take medicines as directed by your surgeon. Ask your surgeon about changing or stopping your regular medicines. You may have lab tests the morning of surgery. PROCEDURE  You will be given one of the following:  A medicine that numbs the area (local anesthesia). A medicine that makes you go to sleep (general anesthesia). A medicine injected into your spine that numbs your body below the waist (spinal anesthesia). Most often, several small cuts (incisions) are made in the knee. The arthroscope and instruments go into the incisions to repair the damage. The torn portion of the meniscus is removed.   AFTER THE PROCEDURE You will be taken to the recovery  area where your progress will be monitored. When you are awake, stable, and taking fluids without complications, you will be allowed to go home. This is usually the same day. A torn or stretched ligament (ligament sprain) may take 6-8 weeks to heal.  It takes about the 4-6 WEEKS if your surgeon removed a torn meniscus. A repaired meniscus may require 6-12 weeks of recovery time. A torn ligament  needing reconstructive surgery may take 6-12 months to heal fully.   This information is not intended to replace advice given to you by your health care provider. Make sure you discuss any questions you have with your health care provider. You have decided to proceed with operative arthroscopy of the knee. You have decided not to continue with nonoperative measures such as but not limited to oral medication, weight loss, activity modification, physical therapy, bracing, or injection.  We will perform operative arthroscopy of the knee. Some of the risks associated with arthroscopic surgery of the knee include but are not limited to Bleeding Infection Swelling Stiffness Blood clot Pain Need for knee replacement surgery    In compliance with recent [place] law in federal regulation regarding opioid use and abuse and addiction, we will taper (stop) opioid medication after 2 weeks.  If you're not comfortable with these risks and would like to continue with nonoperative treatment please let Dr. Aline Brochure know prior to your surgery.

## 2022-05-12 NOTE — Patient Instructions (Signed)
Mackenzie Mcpherson  05/12/2022     '@[email or web address]'$ @   Your procedure is scheduled on  05/17/2022.   Report to [facility] at  1150  A.M.   Call this number if you have problems the morning of surgery:  [phone]  If you experience any cold or flu symptoms such as cough, fever, chills, shortness of breath, etc. between now and your scheduled surgery, please notify us at the above number.   Remember:  Do not eat or drink after midnight.      Take these medicines the morning of surgery with A SIP OF WATER                              metoprolol, thyroid.     Do not wear jewelry, make-up or nail polish.  Do not wear lotions, powders, or perfumes, or deodorant.  Do not shave 48 hours prior to surgery.  Men may shave face and neck.  Do not bring valuables to the hospital.  [facility] is not responsible for any belongings or valuables.  Contacts, dentures or bridgework may not be worn into surgery.  Leave your suitcase in the car.  After surgery it may be brought to your room.  For patients admitted to the hospital, discharge time will be determined by your treatment team.  Patients discharged the day of surgery will not be allowed to drive home and must have someone with them for 24 hours.    Special instructions:   DO NOT smoke tobacco or vape for 24 hors before your procedure.  Please read over the following fact sheets that you were given. Pain Booklet, Coughing and Deep Breathing, Surgical Site Infection Prevention, Anesthesia Post-op Instructions, and Care and Recovery After Surgery      Arthroscopic Knee Ligament Repair, Care After This sheet gives you information about how to care for yourself after your procedure. Your health care provider may also give you more specific instructions. If you have problems or questions, contact your health care provider. What can I expect after the procedure? After the procedure, it is common to have: Soreness or  pain in your knee. Bruising and swelling on your knee, calf, and ankle for 3-4 days. A small amount of fluid coming from the incisions. Follow these instructions at home: Medicines Take over-the-counter and prescription medicines only as told by your health care provider. Ask your health care provider if the medicine prescribed to you: Requires you to avoid driving or using machinery. Can cause constipation. You may need to take these actions to prevent or treat constipation: Drink enough fluid to keep your urine pale yellow. Take over-the-counter or prescription medicines. Eat foods that are high in fiber, such as beans, whole grains, and fresh fruits and vegetables. Limit foods that are high in fat and processed sugars, such as fried or sweet foods. If you have a brace or immobilizer: Wear it as told by your health care provider. Remove it only as told by your health care provider. Loosen it if your toes tingle, become numb, or turn cold and blue. Keep it clean and dry. Ask your health care provider when it is safe to drive. Bathing Do not take baths, swim, or use a hot tub until your health care provider approves. Keep your bandage (dressing) dry until your health care provider says that it can be removed. If the brace or immobilizer is  not waterproof: Do not let it get wet. Cover it with a watertight covering when you take a bath or shower. Incision care  Follow instructions from your health care provider about how to take care of your incisions. Make sure you: Wash your hands with soap and water for at least 20 seconds before and after you change your dressing. If soap and water are not available, use hand sanitizer. Change your dressing as told by your health care provider. Leave stitches (sutures), skin glue, or adhesive strips in place. These skin closures may need to stay in place for 2 weeks or longer. If adhesive strip edges start to loosen and curl up, you may trim the loose  edges. Do not remove adhesive strips completely unless your health care provider tells you to do that. Check your incision areas every day for signs of infection. Check for: Redness. More swelling or pain. Blood or more fluid. Warmth. Pus or a bad smell. Managing pain, stiffness, and swelling  If directed, put ice on the affected area. To do this: If you have a removable brace or immobilizer, remove it as told by your health care provider. Put ice in a plastic bag. Place a towel between your skin and the bag. Leave the ice on for 20 minutes, 2-3 times a day. Remove the ice if your skin turns bright red. This is very important. If you cannot feel pain, heat, or cold, you have a greater risk of damage to the area. Move your toes often to reduce stiffness and swelling. Raise (elevate) the injured area above the level of your heart while you are sitting or lying down. Activity Do not use your knee to support your body weight until your health care provider says that you can. Use crutches or other devices as told by your health care provider. Do physical therapy exercises as told by your health care provider. Physical therapy will help you regain movement and strength in your knee. Follow instructions from your health care provider about: When you may start motion exercises. When you may start riding a stationary bike and doing other low-impact activities. When you may start to jog and do other high-impact activities. Do not lift anything that is heavier than 10 lb (4.5 kg), or the limit that you are told, until your health care provider says that it is safe. Ask your health care provider what activities are safe for you. General instructions Do not use any products that contain nicotine or tobacco, such as cigarettes, e-cigarettes, and chewing tobacco. These can delay healing. If you need help quitting, ask your health care provider. Wear compression stockings as told by your health care  provider. These stockings help to prevent blood clots and reduce swelling in your legs. Keep all follow-up visits. This is important. Contact a health care provider if: You have any of these signs of infection: Redness around an incision. Blood or more fluid coming from an incision. Warmth coming from an incision. Pus or a bad smell coming from an incision. More swelling or pain in your knee. A fever or chills. You have pain that does not get better with medicine. Your incision opens up. Get help right away if: You have trouble breathing. You have chest pain. You have increased pain or swelling in your calf or at the back of your knee. You have numbness and tingling near the knee joint or in the foot, ankle, or toes. You notice that your foot or toes look darker than  normal or are cooler than normal. These symptoms may represent a serious problem that is an emergency. Do not wait to see if the symptoms will go away. Get medical help right away. Call your local emergency services (911 in the [place] Do not drive yourself to the hospital. Summary After the procedure, it is common to have knee pain with bruising and swelling on your knee, calf, and ankle. Icing your knee and raising your leg above the level of your heart will help control the pain and swelling. Do physical therapy exercises as told by your health care provider. Physical therapy will help you regain movement and strength in your knee. This information is not intended to replace advice given to you by your health care provider. Make sure you discuss any questions you have with your health care provider. Document Revised: 09/09/2019 Document Reviewed: 09/09/2019 Elsevier Patient Education  [place] Anesthesia, Adult, Care After The following information offers guidance on how to care for yourself after your procedure. Your health care provider may also give you more specific instructions. If you have problems  or questions, contact your health care provider. What can I expect after the procedure? After the procedure, it is common for people to: Have pain or discomfort at the IV site. Have nausea or vomiting. Have a sore throat or hoarseness. Have trouble concentrating. Feel cold or chills. Feel weak, sleepy, or tired (fatigue). Have soreness and body aches. These can affect parts of the body that were not involved in surgery. Follow these instructions at home: For the time period you were told by your health care provider:  Rest. Do not participate in activities where you could fall or become injured. Do not drive or use machinery. Do not drink alcohol. Do not take sleeping pills or medicines that cause drowsiness. Do not make important decisions or sign legal documents. Do not take care of children on your own. General instructions Drink enough fluid to keep your urine pale yellow. If you have sleep apnea, surgery and certain medicines can increase your risk for breathing problems. Follow instructions from your health care provider about wearing your sleep device: Anytime you are sleeping, including during daytime naps. While taking prescription pain medicines, sleeping medicines, or medicines that make you drowsy. Return to your normal activities as told by your health care provider. Ask your health care provider what activities are safe for you. Take over-the-counter and prescription medicines only as told by your health care provider. Do not use any products that contain nicotine or tobacco. These products include cigarettes, chewing tobacco, and vaping devices, such as e-cigarettes. These can delay incision healing after surgery. If you need help quitting, ask your health care provider. Contact a health care provider if: You have nausea or vomiting that does not get better with medicine. You vomit every time you eat or drink. You have pain that does not get better with medicine. You  cannot urinate or have bloody urine. You develop a skin rash. You have a fever. Get help right away if: You have trouble breathing. You have chest pain. You vomit blood. These symptoms may be an emergency. Get help right away. Call 911. Do not wait to see if the symptoms will go away. Do not drive yourself to the hospital. Summary After the procedure, it is common to have a sore throat, hoarseness, nausea, vomiting, or to feel weak, sleepy, or fatigue. For the time period you were told by your health care provider, do  not drive or use machinery. Get help right away if you have difficulty breathing, have chest pain, or vomit blood. These symptoms may be an emergency. This information is not intended to replace advice given to you by your health care provider. Make sure you discuss any questions you have with your health care provider. Document Revised: 07/09/2021 Document Reviewed: 07/09/2021 Elsevier Patient Education  [place]. How to Use Chlorhexidine Before Surgery Chlorhexidine gluconate (CHG) is a germ-killing (antiseptic) solution that is used to clean the skin. It can get rid of the bacteria that normally live on the skin and can keep them away for about 24 hours. To clean your skin with CHG, you may be given: A CHG solution to use in the shower or as part of a sponge bath. A prepackaged cloth that contains CHG. Cleaning your skin with CHG may help lower the risk for infection: While you are staying in the intensive care unit of the hospital. If you have a vascular access, such as a central line, to provide short-term or long-term access to your veins. If you have a catheter to drain urine from your bladder. If you are on a ventilator. A ventilator is a machine that helps you breathe by moving air in and out of your lungs. After surgery. What are the risks? Risks of using CHG include: A skin reaction. Hearing loss, if CHG gets in your ears and you have a perforated  eardrum. Eye injury, if CHG gets in your eyes and is not rinsed out. The CHG product catching fire. Make sure that you avoid smoking and flames after applying CHG to your skin. Do not use CHG: If you have a chlorhexidine allergy or have previously reacted to chlorhexidine. On babies younger than 31 months of age. How to use CHG solution Use CHG only as told by your health care provider, and follow the instructions on the label. Use the full amount of CHG as directed. Usually, this is one bottle. During a shower Follow these steps when using CHG solution during a shower (unless your health care provider gives you different instructions): Start the shower. Use your normal soap and shampoo to wash your face and hair. Turn off the shower or move out of the shower stream. Pour the CHG onto a clean washcloth. Do not use any type of brush or rough-edged sponge. Starting at your neck, lather your body down to your toes. Make sure you follow these instructions: If you will be having surgery, pay special attention to the part of your body where you will be having surgery. Scrub this area for at least 1 minute. Do not use CHG on your head or face. If the solution gets into your ears or eyes, rinse them well with water. Avoid your genital area. Avoid any areas of skin that have broken skin, cuts, or scrapes. Scrub your back and under your arms. Make sure to wash skin folds. Let the lather sit on your skin for 1-2 minutes or as long as told by your health care provider. Thoroughly rinse your entire body in the shower. Make sure that all body creases and crevices are rinsed well. Dry off with a clean towel. Do not put any substances on your body afterward--such as powder, lotion, or perfume--unless you are told to do so by your health care provider. Only use lotions that are recommended by the manufacturer. Put on clean clothes or pajamas. If it is the night before your surgery, sleep in clean  sheets.  During a sponge bath Follow these steps when using CHG solution during a sponge bath (unless your health care provider gives you different instructions): Use your normal soap and shampoo to wash your face and hair. Pour the CHG onto a clean washcloth. Starting at your neck, lather your body down to your toes. Make sure you follow these instructions: If you will be having surgery, pay special attention to the part of your body where you will be having surgery. Scrub this area for at least 1 minute. Do not use CHG on your head or face. If the solution gets into your ears or eyes, rinse them well with water. Avoid your genital area. Avoid any areas of skin that have broken skin, cuts, or scrapes. Scrub your back and under your arms. Make sure to wash skin folds. Let the lather sit on your skin for 1-2 minutes or as long as told by your health care provider. Using a different clean, wet washcloth, thoroughly rinse your entire body. Make sure that all body creases and crevices are rinsed well. Dry off with a clean towel. Do not put any substances on your body afterward--such as powder, lotion, or perfume--unless you are told to do so by your health care provider. Only use lotions that are recommended by the manufacturer. Put on clean clothes or pajamas. If it is the night before your surgery, sleep in clean sheets. How to use CHG prepackaged cloths Only use CHG cloths as told by your health care provider, and follow the instructions on the label. Use the CHG cloth on clean, dry skin. Do not use the CHG cloth on your head or face unless your health care provider tells you to. When washing with the CHG cloth: Avoid your genital area. Avoid any areas of skin that have broken skin, cuts, or scrapes. Before surgery Follow these steps when using a CHG cloth to clean before surgery (unless your health care provider gives you different instructions): Using the CHG cloth, vigorously scrub the  part of your body where you will be having surgery. Scrub using a back-and-forth motion for 3 minutes. The area on your body should be completely wet with CHG when you are done scrubbing. Do not rinse. Discard the cloth and let the area air-dry. Do not put any substances on the area afterward, such as powder, lotion, or perfume. Put on clean clothes or pajamas. If it is the night before your surgery, sleep in clean sheets.  For general bathing Follow these steps when using CHG cloths for general bathing (unless your health care provider gives you different instructions). Use a separate CHG cloth for each area of your body. Make sure you wash between any folds of skin and between your fingers and toes. Wash your body in the following order, switching to a new cloth after each step: The front of your neck, shoulders, and chest. Both of your arms, under your arms, and your hands. Your stomach and groin area, avoiding the genitals. Your right leg and foot. Your left leg and foot. The back of your neck, your back, and your buttocks. Do not rinse. Discard the cloth and let the area air-dry. Do not put any substances on your body afterward--such as powder, lotion, or perfume--unless you are told to do so by your health care provider. Only use lotions that are recommended by the manufacturer. Put on clean clothes or pajamas. Contact a health care provider if: Your skin gets irritated after scrubbing. You have  questions about using your solution or cloth. You swallow any chlorhexidine. Call your local poison control center (1-212-694-9034 in the [place] Get help right away if: Your eyes itch badly, or they become very red or swollen. Your skin itches badly and is red or swollen. Your hearing changes. You have trouble seeing. You have swelling or tingling in your mouth or throat. You have trouble breathing. These symptoms may represent a serious problem that is an emergency. Do not wait to see if the  symptoms will go away. Get medical help right away. Call your local emergency services (911 in the [place] Do not drive yourself to the hospital. Summary Chlorhexidine gluconate (CHG) is a germ-killing (antiseptic) solution that is used to clean the skin. Cleaning your skin with CHG may help to lower your risk for infection. You may be given CHG to use for bathing. It may be in a bottle or in a prepackaged cloth to use on your skin. Carefully follow your health care provider's instructions and the instructions on the product label. Do not use CHG if you have a chlorhexidine allergy. Contact your health care provider if your skin gets irritated after scrubbing. This information is not intended to replace advice given to you by your health care provider. Make sure you discuss any questions you have with your health care provider. Document Revised: 08/09/2021 Document Reviewed: 06/22/2020 Elsevier Patient Education  [place].

## 2022-05-13 ENCOUNTER — Encounter (HOSPITAL_COMMUNITY): Payer: Self-pay

## 2022-05-13 ENCOUNTER — Encounter (HOSPITAL_COMMUNITY)
Admission: RE | Admit: 2022-05-13 | Discharge: 2022-05-13 | Disposition: A | Discharge status: Home / Self Care | Payer: Federal, State, Local not specified - PPO | Source: Ambulatory Visit | Attending: Orthopedic Surgery | Admitting: Orthopedic Surgery

## 2022-05-13 VITALS — BP 152/69 | HR 71 | Temp 97.8°F | Resp 18 | Ht 59.0 in | Wt 156.7 lb

## 2022-05-13 DIAGNOSIS — I10 Essential (primary) hypertension: Secondary | ICD-10-CM | POA: Diagnosis not present

## 2022-05-13 DIAGNOSIS — M23322 Other meniscus derangements, posterior horn of medial meniscus, left knee: Secondary | ICD-10-CM

## 2022-05-13 DIAGNOSIS — Z01818 Encounter for other preprocedural examination: Secondary | ICD-10-CM | POA: Diagnosis present

## 2022-05-13 LAB — CBC WITH DIFFERENTIAL/PLATELET
Abs Immature Granulocytes: 0.01 10*3/uL (ref 0.00–0.07)
Basophils Absolute: 0 10*3/uL (ref 0.0–0.1)
Basophils Relative: 0 %
Eosinophils Absolute: 0.2 10*3/uL (ref 0.0–0.5)
Eosinophils Relative: 3 %
HCT: 37.6 % (ref 36.0–46.0)
Hemoglobin: 12.5 g/dL (ref 12.0–15.0)
Immature Granulocytes: 0 %
Lymphocytes Relative: 33 %
Lymphs Abs: 1.6 10*3/uL (ref 0.7–4.0)
MCH: 30.2 pg (ref 26.0–34.0)
MCHC: 33.2 g/dL (ref 30.0–36.0)
MCV: 90.8 fL (ref 80.0–100.0)
Monocytes Absolute: 0.3 10*3/uL (ref 0.1–1.0)
Monocytes Relative: 6 %
Neutro Abs: 2.8 10*3/uL (ref 1.7–7.7)
Neutrophils Relative %: 58 %
Platelets: 213 10*3/uL (ref 150–400)
RBC: 4.14 MIL/uL (ref 3.87–5.11)
RDW: 14.4 % (ref 11.5–15.5)
WBC: 4.8 10*3/uL (ref 4.0–10.5)
nRBC: 0 % (ref 0.0–0.2)

## 2022-05-13 LAB — BASIC METABOLIC PANEL
Anion gap: 8 (ref 5–15)
BUN: 16 mg/dL (ref 8–23)
CO2: 27 mmol/L (ref 22–32)
Calcium: 8.8 mg/dL — ABNORMAL LOW (ref 8.9–10.3)
Chloride: 105 mmol/L (ref 98–111)
Creatinine, Ser: 0.81 mg/dL (ref 0.44–1.00)
GFR, Estimated: >60 mL/min (ref >60)
Glucose, Bld: 126 mg/dL — ABNORMAL HIGH (ref 70–99)
Potassium: 3.5 mmol/L (ref 3.5–5.1)
Sodium: 140 mmol/L (ref 135–145)

## 2022-05-13 NOTE — H&P (Addendum)
Chief Complaint  Patient presents with   Knee Pain      LT knee//no known injury// started hurting after she stood outside in the cold at an election booth for 8 hours    72 year old female was standing at election booth for 8 hours after that she started having pain in the medial side of her knee progressively worsened.  She had to do a lot of walking on a trip during the time at the airport and went to her primary care doctor she took some ibuprofen use the knee brace but did not improve she had an x-ray which was normal  She did have an MRI that shows she has a torn medial meniscus.  She would like to go ahead and proceed with arthroscopic surgery and remove the torn meniscal fragment from the left knee   Review of systems  No chest pain shortness of breath no fever Physical Exam Vitals and nursing note reviewed.  Constitutional:      Appearance: Normal appearance.  HENT:     Head: Normocephalic and atraumatic.  Eyes:     General: No scleral icterus.       Right eye: No discharge.        Left eye: No discharge.     Extraocular Movements: Extraocular movements intact.     Conjunctiva/sclera: Conjunctivae normal.     Pupils: Pupils are equal, round, and reactive to light.  Cardiovascular:     Rate and Rhythm: Normal rate.     Pulses: Normal pulses.  Musculoskeletal:     Left knee: Effusion present. No swelling, deformity, erythema, ecchymosis, lacerations or crepitus. Normal range of motion. Tenderness present over the medial joint line. No LCL laxity, MCL laxity, ACL laxity or PCL laxity.Abnormal meniscus. Normal alignment and normal patellar mobility. Normal pulse.  Skin:    General: Skin is warm and dry.     Capillary Refill: Capillary refill takes less than 2 seconds.  Neurological:     General: No focal deficit present.     Mental Status: She is alert and oriented to person, place, and time.  Psychiatric:        Mood and Affect: Mood normal.        Behavior:  Behavior normal.        Thought Content: Thought content normal.        Judgment: Judgment normal.   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 next exam in 5 years.   COLONOSCOPY N/A 08/12/2020    Procedure: COLONOSCOPY;  Surgeon: Gala Romney,  Cristopher Estimable, MD;  Location: [facility];  Service: Endoscopy;  Laterality: N/A;  AM   ESOPHAGOGASTRODUODENOSCOPY   08/15/2008    Moderate size hiatal hernia/Noncritical Schatzki's ring with superimposed distal esophageal erosion consistent with erosive reflux esophagitis   ESOPHAGOGASTRODUODENOSCOPY N/A 06/11/2014    AOZ:[id number] ring dilated/HH   LEFT HEART CATH AND CORONARY ANGIOGRAPHY N/A 05/11/2018    Procedure: LEFT HEART CATH AND CORONARY ANGIOGRAPHY;  Surgeon: Dixie Dials, MD;  Location: [facility];  Service: Cardiovascular;  Laterality: N/A;   POLYPECTOMY   08/12/2020    Procedure: POLYPECTOMY;  Surgeon: Daneil Dolin, MD;  Location: [facility];  Service: Endoscopy;;   Family History  Problem Relation Age of Onset   Hypertension Father    Stroke Mother    Diabetes Mother    Colon cancer Brother 24   Colon cancer Brother 63   Prostate cancer Brother     Social History   Tobacco Use   Smoking status: Never   Smokeless tobacco: Never  Vaping  Use   Vaping Use: Never used  Substance Use Topics   Alcohol use: No    Alcohol/week: 0.0 standard drinks of alcohol   Drug use: No    Current Outpatient Medications  Medication Instructions   aspirin EC 81 mg, Oral, Daily   b complex vitamins capsule 1 capsule, Oral, Daily   Biotin w/ Vitamins C & E (HAIR SKIN & NAILS GUMMIES PO) 2 tablets, Oral, Daily   BLACK CURRANT SEED OIL PO 1 drop, Oral, Daily   calcium carbonate (TUMS - DOSED IN MG ELEMENTAL CALCIUM) 500-1,000 mg, Oral, 2 times daily PRN   fluticasone (FLONASE) 50 MCG/ACT nasal spray 1-2 sprays, Each Nare, Daily PRN   losartan-hydrochlorothiazide (HYZAAR) 100-12.5 MG tablet 1 tablet, Oral, Daily   metoprolol tartrate (LOPRESSOR) 25 MG tablet TAKE 1/2 TABLET BY MOUTH TWICE DAILY   Multiple Vitamin (MULTIVITAMIN WITH MINERALS) TABS tablet 2 tablets, Oral, Daily   nitroGLYCERIN (NITROSTAT) 0.4 mg, Sublingual, Every 5 min x3 PRN   NP Thyroid 120 mg, Oral, Daily before breakfast   Polyethyl Glycol-Propyl Glycol (SYSTANE OP) 1 drop, Both Eyes, Daily PRN   Probiotic Product (PROBIOTIC PO) 1 capsule, Oral, Daily   rosuvastatin (CRESTOR) 40 mg, Oral, Daily   sodium chloride (OCEAN) 0.65 % SOLN nasal spray 1 spray, Each Nare, At bedtime PRN   thyroid (ARMOUR) 60 mg, Oral, Daily before breakfast   TURMERIC PO 1 capsule, Oral, Daily   Vitamin D3 5,000 Units, Oral, Daily   VITAMIN E PO 1 capsule, Oral, Daily   Assessment and plan  Torn medial meniscus left knee  Arthroscopic medial meniscectomy  The procedure has been fully reviewed with the patient; The risks and benefits of surgery have been discussed and explained and understood. Alternative treatment has also been reviewed, questions were encouraged and answered. The postoperative plan is also been reviewed.

## 2022-05-17 ENCOUNTER — Encounter (HOSPITAL_COMMUNITY)
Admission: RE | Disposition: A | Discharge status: Home / Self Care | Payer: Self-pay | Source: Home / Self Care | Attending: Orthopedic Surgery

## 2022-05-17 ENCOUNTER — Ambulatory Visit (HOSPITAL_COMMUNITY)
Admission: RE | Admit: 2022-05-17 | Discharge: 2022-05-17 | Disposition: A | Discharge status: Home / Self Care | Payer: Federal, State, Local not specified - PPO | Attending: Orthopedic Surgery | Admitting: Orthopedic Surgery

## 2022-05-17 ENCOUNTER — Encounter (HOSPITAL_COMMUNITY): Payer: Self-pay | Admitting: Orthopedic Surgery

## 2022-05-17 ENCOUNTER — Ambulatory Visit (HOSPITAL_COMMUNITY): Payer: Federal, State, Local not specified - PPO | Admitting: Anesthesiology

## 2022-05-17 ENCOUNTER — Other Ambulatory Visit: Payer: Self-pay

## 2022-05-17 DIAGNOSIS — I10 Essential (primary) hypertension: Secondary | ICD-10-CM | POA: Insufficient documentation

## 2022-05-17 DIAGNOSIS — Z79899 Other long term (current) drug therapy: Secondary | ICD-10-CM | POA: Diagnosis not present

## 2022-05-17 DIAGNOSIS — X58XXXA Exposure to other specified factors, initial encounter: Secondary | ICD-10-CM | POA: Diagnosis not present

## 2022-05-17 DIAGNOSIS — E039 Hypothyroidism, unspecified: Secondary | ICD-10-CM | POA: Insufficient documentation

## 2022-05-17 DIAGNOSIS — S83242A Other tear of medial meniscus, current injury, left knee, initial encounter: Secondary | ICD-10-CM | POA: Insufficient documentation

## 2022-05-17 DIAGNOSIS — S83232D Complex tear of medial meniscus, current injury, left knee, subsequent encounter: Secondary | ICD-10-CM | POA: Diagnosis not present

## 2022-05-17 DIAGNOSIS — S83249A Other tear of medial meniscus, current injury, unspecified knee, initial encounter: Secondary | ICD-10-CM

## 2022-05-17 DIAGNOSIS — Z79890 Hormone replacement therapy (postmenopausal): Secondary | ICD-10-CM | POA: Insufficient documentation

## 2022-05-17 DIAGNOSIS — S83241A Other tear of medial meniscus, current injury, right knee, initial encounter: Secondary | ICD-10-CM

## 2022-05-17 DIAGNOSIS — S83232A Complex tear of medial meniscus, current injury, left knee, initial encounter: Secondary | ICD-10-CM

## 2022-05-17 HISTORY — PX: KNEE ARTHROSCOPY WITH MEDIAL MENISECTOMY: SHX5651

## 2022-05-17 SURGERY — ARTHROSCOPY, KNEE, WITH MEDIAL MENISCECTOMY
Anesthesia: General | Site: Knee | Laterality: Left

## 2022-05-17 MED ORDER — ONDANSETRON HCL 4 MG/2ML IJ SOLN
INTRAMUSCULAR | Status: DC | PRN
  Administered 2022-05-17: 4 mg via INTRAVENOUS

## 2022-05-17 MED ORDER — IBUPROFEN 800 MG PO TABS: 800.0000 mg | ORAL_TABLET | Freq: Three times a day (TID) | ORAL | 1 refills | Status: DC | PRN

## 2022-05-17 MED ORDER — DEXAMETHASONE SODIUM PHOSPHATE 10 MG/ML IJ SOLN
INTRAMUSCULAR | Status: DC | PRN
  Administered 2022-05-17: 10 mg via INTRAVENOUS

## 2022-05-17 MED ORDER — FENTANYL CITRATE (PF) 100 MCG/2ML IJ SOLN
INTRAMUSCULAR | Status: AC
  Filled 2022-05-17: qty 2

## 2022-05-17 MED ORDER — ACETAMINOPHEN 500 MG PO TABS: 500.0000 mg | ORAL_TABLET | Freq: Four times a day (QID) | ORAL | 0 refills | Status: DC | PRN

## 2022-05-17 MED ORDER — EPINEPHRINE PF 1 MG/ML IJ SOLN
INTRAMUSCULAR | Status: AC
  Filled 2022-05-17: qty 8

## 2022-05-17 MED ORDER — ORAL CARE MOUTH RINSE: 15.0000 mL | Freq: Once | OROMUCOSAL | Status: AC

## 2022-05-17 MED ORDER — LIDOCAINE 2% (20 MG/ML) 5 ML SYRINGE
INTRAMUSCULAR | Status: DC | PRN
  Administered 2022-05-17: 60 mg via INTRAVENOUS

## 2022-05-17 MED ORDER — BUPIVACAINE-EPINEPHRINE (PF) 0.5% -1:200000 IJ SOLN
INTRAMUSCULAR | Status: AC
  Filled 2022-05-17: qty 60

## 2022-05-17 MED ORDER — ROCURONIUM BROMIDE 10 MG/ML (PF) SYRINGE
PREFILLED_SYRINGE | INTRAVENOUS | Status: AC
  Filled 2022-05-17: qty 20

## 2022-05-17 MED ORDER — CEFAZOLIN SODIUM-DEXTROSE 2-4 GM/100ML-% IV SOLN
2.0000 g | INTRAVENOUS | Status: AC
  Administered 2022-05-17: 2 g via INTRAVENOUS
  Filled 2022-05-17: qty 100

## 2022-05-17 MED ORDER — EPHEDRINE SULFATE (PRESSORS) 50 MG/ML IJ SOLN
INTRAMUSCULAR | Status: DC | PRN
  Administered 2022-05-17: 5 mg via INTRAVENOUS

## 2022-05-17 MED ORDER — SODIUM CHLORIDE 0.9 % IR SOLN
Status: DC | PRN
  Administered 2022-05-17 (×2): 3000 mL

## 2022-05-17 MED ORDER — LACTATED RINGERS IV SOLN: INTRAVENOUS | Status: DC

## 2022-05-17 MED ORDER — EPHEDRINE 5 MG/ML INJ
INTRAVENOUS | Status: AC
  Filled 2022-05-17: qty 5

## 2022-05-17 MED ORDER — ONDANSETRON HCL 4 MG/2ML IJ SOLN
INTRAMUSCULAR | Status: AC
  Filled 2022-05-17: qty 2

## 2022-05-17 MED ORDER — PROPOFOL 10 MG/ML IV BOLUS
INTRAVENOUS | Status: DC | PRN
  Administered 2022-05-17: 160 mg via INTRAVENOUS

## 2022-05-17 MED ORDER — DEXAMETHASONE SODIUM PHOSPHATE 10 MG/ML IJ SOLN
INTRAMUSCULAR | Status: AC
  Filled 2022-05-17: qty 1

## 2022-05-17 MED ORDER — LIDOCAINE HCL (PF) 2 % IJ SOLN
INTRAMUSCULAR | Status: AC
  Filled 2022-05-17: qty 15

## 2022-05-17 MED ORDER — SODIUM CHLORIDE 0.9 % IR SOLN: Status: DC | PRN

## 2022-05-17 MED ORDER — BUPIVACAINE-EPINEPHRINE (PF) 0.5% -1:200000 IJ SOLN
INTRAMUSCULAR | Status: DC | PRN
  Administered 2022-05-17: 30 mL

## 2022-05-17 MED ORDER — PROPOFOL 10 MG/ML IV BOLUS
INTRAVENOUS | Status: AC
  Filled 2022-05-17: qty 20

## 2022-05-17 MED ORDER — CHLORHEXIDINE GLUCONATE 0.12 % MT SOLN
15.0000 mL | Freq: Once | OROMUCOSAL | Status: AC
  Administered 2022-05-17: 15 mL via OROMUCOSAL

## 2022-05-17 MED ORDER — FENTANYL CITRATE (PF) 100 MCG/2ML IJ SOLN
INTRAMUSCULAR | Status: DC | PRN
  Administered 2022-05-17 (×4): 25 ug via INTRAVENOUS

## 2022-05-17 SURGICAL SUPPLY — 49 items
ABLATOR ASPIRATE 50D MULTI-PRT (SURGICAL WAND) IMPLANT
APL PRP STRL LF DISP 70% ISPRP (MISCELLANEOUS) ×1
BAG HAMPER (MISCELLANEOUS) ×1 IMPLANT
BLADE SURG SZ11 CARB STEEL (BLADE) ×1 IMPLANT
BNDG CMPR STD VLCR NS LF 5.8X6 (GAUZE/BANDAGES/DRESSINGS) ×1
BNDG ELASTIC 6X5.8 VLCR NS LF (GAUZE/BANDAGES/DRESSINGS) ×1 IMPLANT
CHLORAPREP W/TINT 26 (MISCELLANEOUS) ×1 IMPLANT
CLOTH BEACON ORANGE TIMEOUT ST (SAFETY) ×1 IMPLANT
COOLER ICEMAN CLASSIC (MISCELLANEOUS) ×1 IMPLANT
COVER LIGHT HANDLE STERIS (MISCELLANEOUS) ×2 IMPLANT
CUFF TOURN SGL QUICK 34 (TOURNIQUET CUFF) ×1
CUFF TRNQT CYL 34X4.125X (TOURNIQUET CUFF) IMPLANT
DECANTER SPIKE VIAL GLASS SM (MISCELLANEOUS) ×2 IMPLANT
DRAPE HALF SHEET 40X57 (DRAPES) ×1 IMPLANT
GAUZE 4X4 16PLY ~~LOC~~+RFID DBL (SPONGE) ×1 IMPLANT
GAUZE SPONGE 4X4 12PLY STRL (GAUZE/BANDAGES/DRESSINGS) ×1 IMPLANT
GAUZE XEROFORM 5X9 LF (GAUZE/BANDAGES/DRESSINGS) ×1 IMPLANT
GLOVE BIOGEL PI IND STRL 7.0 (GLOVE) ×2 IMPLANT
GLOVE SS N UNI LF 8.5 STRL (GLOVE) ×1 IMPLANT
GLOVE SURG POLYISO LF SZ8 (GLOVE) ×1 IMPLANT
GOWN STRL REUS W/TWL LRG LVL3 (GOWN DISPOSABLE) ×1 IMPLANT
GOWN STRL REUS W/TWL XL LVL3 (GOWN DISPOSABLE) ×1 IMPLANT
IV NS IRRIG 3000ML ARTHROMATIC (IV SOLUTION) ×2 IMPLANT
KIT BLADEGUARD II DBL (SET/KITS/TRAYS/PACK) ×1 IMPLANT
KIT TURNOVER CYSTO (KITS) ×1 IMPLANT
MANIFOLD NEPTUNE II (INSTRUMENTS) ×1 IMPLANT
MARKER SKIN DUAL TIP RULER LAB (MISCELLANEOUS) ×1 IMPLANT
NDL HYPO 18GX1.5 BLUNT FILL (NEEDLE) ×1 IMPLANT
NDL HYPO 21X1.5 SAFETY (NEEDLE) ×1 IMPLANT
NDL SPNL 18GX3.5 QUINCKE PK (NEEDLE) ×1 IMPLANT
NEEDLE HYPO 18GX1.5 BLUNT FILL (NEEDLE) ×1 IMPLANT
NEEDLE HYPO 21X1.5 SAFETY (NEEDLE) ×1 IMPLANT
NEEDLE SPNL 18GX3.5 QUINCKE PK (NEEDLE) ×1 IMPLANT
NS IRRIG 1000ML POUR BTL (IV SOLUTION) ×1 IMPLANT
PACK ARTHROSCOPY WL (CUSTOM PROCEDURE TRAY) ×1 IMPLANT
PAD ABD 5X9 TENDERSORB (GAUZE/BANDAGES/DRESSINGS) ×1 IMPLANT
PAD ARMBOARD 7.5X6 YLW CONV (MISCELLANEOUS) ×1 IMPLANT
PAD COLD SHLDR SM WRAP-ON (PAD) IMPLANT
PAD FOR LEG HOLDER (MISCELLANEOUS) ×1 IMPLANT
PADDING CAST COTTON 6X4 STRL (CAST SUPPLIES) ×1 IMPLANT
PORT APPOLLO RF 90DEGREE MULTI (SURGICAL WAND) IMPLANT
RESECTOR TORPEDO 4MM 13CM CVD (MISCELLANEOUS) IMPLANT
SET ARTHROSCOPY INST (INSTRUMENTS) ×1 IMPLANT
SET BASIN LINEN APH (SET/KITS/TRAYS/PACK) ×1 IMPLANT
SUT ETHILON 3 0 FSL (SUTURE) ×1 IMPLANT
SYR 10ML LL (SYRINGE) ×1 IMPLANT
SYR 30ML LL (SYRINGE) ×2 IMPLANT
TUBE CONNECTING 12X1/4 (SUCTIONS) ×2 IMPLANT
TUBING IN/OUT FLOW W/MAIN PUMP (TUBING) ×1 IMPLANT

## 2022-05-17 NOTE — Interval H&P Note (Signed)
History and Physical Interval Note:  05/17/2022 1:52 PM  Mackenzie Mcpherson  has presented today for surgery, with the diagnosis of left knee arthroscopy medial meniscectomy.  The various methods of treatment have been discussed with the patient and family. After consideration of risks, benefits and other options for treatment, the patient has consented to  Procedure(s): KNEE ARTHROSCOPY WITH MEDIAL MENISECTOMY (Left) as a surgical intervention.  The patient's history has been reviewed, patient examined, no change in status, stable for surgery.  I have reviewed the patient's chart and labs.  Questions were answered to the patient's satisfaction.     Arther Abbott

## 2022-05-17 NOTE — Brief Op Note (Signed)
05/17/2022  3:00 PM  PATIENT:  Mackenzie Mcpherson  72 y.o. female  PRE-OPERATIVE DIAGNOSIS:  left knee arthroscopy medial meniscectomy  POST-OPERATIVE DIAGNOSIS:  left knee arthroscopy medial meniscectomy  PROCEDURE:  Procedure(s): KNEE ARTHROSCOPY WITH MEDIAL MENISECTOMY (Left)  SURGEON:  Surgeon(s) and Role:    Carole Civil, MD - Primary  PHYSICIAN ASSISTANT:   ASSISTANTS: none   ANESTHESIA:   general  EBL:  none   BLOOD ADMINISTERED:none  DRAINS: none   LOCAL MEDICATIONS USED:  MARCAINE     SPECIMEN:  No Specimen  DISPOSITION OF SPECIMEN:  N/A  COUNTS:  YES  TOURNIQUET:  * Missing tourniquet times found for documented tourniquets in log: [id number] *  DICTATION: .Dragon Dictation  PLAN OF CARE: Discharge to home after PACU  PATIENT DISPOSITION:  PACU - hemodynamically stable.   Delay start of Pharmacological VTE agent (>24hrs) due to surgical blood loss or risk of bleeding: not applicable

## 2022-05-17 NOTE — Op Note (Signed)
05/17/2022  3:01 PM    Knee arthroscopy dictation  The patient is a 72 year old female who was standing at election booth for 8 hours after that she started having pain in the medial side of her knee progressively worsened.  She had to do a lot of walking on a trip during the time at the airport and went to her primary care doctor she took some ibuprofen use the knee brace but did not improve she had an x-ray which was normal   She did have an MRI that shows she has a torn medial meniscus.  She would like to go ahead and proceed with arthroscopic surgery and remove the torn meniscal fragment from the left knee Preop diagnosis torn medial meniscus left knee  Postop diagnosis same  Procedure arthroscopy with partial medial meniscectomy  Surgeon Aline Brochure  Operative findings  MEDIAL - meniscus posterior horn tear medial meniscus with complex tear displaced fragment into the notch -articular surface small chondral fissure  LATERAL - meniscus normal -articular surface normal  PTF   -articular surface normal  NOTCH  -acl normal -pcl normal     The patient was identified in the preoperative holding area using 2 approved identification mechanisms. The chart was reviewed and updated. The surgical site was confirmed as left knee and marked with an indelible marker.  The patient was taken to the operating room for anesthesia. After successful general anesthesia, Ancef was used as IV antibiotics.  The patient was placed in the supine position with the (left) the operative extremity in an arthroscopic leg holder and the opposite extremity in a padded leg holder.  The timeout was executed.  A lateral portal was established with an 11 blade and the scope was introduced into the joint. A diagnostic arthroscopy was performed in circumferential manner examining the entire knee joint. A medial portal was established and the diagnostic arthroscopy was repeated using a probe to palpate  intra-articular structures as they were encountered.    The medial meniscus was resected using a duckbill forceps. The meniscal fragments were removed with a motorized shaver. The meniscus was balanced with a combination of a motorized shaver and a 50 ArthroCare wand until a stable rim was obtained.  The arthroscopic pump was placed on the wash mode and any excess debris was removed from the joint using suction.  60 cc of Marcaine with epinephrine was injected through the arthroscope.  The portals were closed with 3-0 nylon suture.  A sterile bandage, Ace wrap and Cryo/Cuff was placed and the Cryo/Cuff was activated. The patient was taken to the recovery room in stable condition.  PHYSICIAN ASSISTANT: no  ASSISTANTS: none   ANESTHESIA:   General  EBL:  none   BLOOD ADMINISTERED:none  DRAINS: none   LOCAL MEDICATIONS USED:  MARCAINE     SPECIMEN:  No Specimen  DISPOSITION OF SPECIMEN:  N/A  COUNTS:  YES   DICTATION: .Dragon Dictation  PLAN OF CARE: Discharge to home after PACU  PATIENT DISPOSITION:  PACU - hemodynamically stable.   Delay start of Pharmacological VTE agent (>24hrs) due to surgical blood loss or risk of bleeding: not applicable  POST OP PLAN  WB as tolerated SUTURES OUT IN A WEEK  AROM  BRACE none

## 2022-05-17 NOTE — Anesthesia Procedure Notes (Signed)
Procedure Name: LMA Insertion Date/Time: 05/17/2022 2:05 PM  Performed by: Karna Dupes, CRNAPre-anesthesia Checklist: Emergency Drugs available, Patient identified, Suction available and Patient being monitored Patient Re-evaluated:Patient Re-evaluated prior to induction Oxygen Delivery Method: Circle system utilized Preoxygenation: Pre-oxygenation with 100% oxygen Induction Type: IV induction LMA: LMA inserted LMA Size: 4.0 Number of attempts: 1 Placement Confirmation: positive ETCO2 and breath sounds checked- equal and bilateral Tube secured with: Tape Dental Injury: Teeth and Oropharynx as per pre-operative assessment

## 2022-05-17 NOTE — Anesthesia Preprocedure Evaluation (Signed)
Anesthesia Evaluation  Patient identified by MRN, date of birth, ID band Patient awake    Reviewed: Allergy & Precautions, H&P , NPO status , Patient's Chart, lab work & pertinent test results, reviewed documented beta blocker date and time   Airway Mallampati: II  TM Distance: >3 FB Neck ROM: Full    Dental  (+) Dental Advisory Given, Teeth Intact   Pulmonary neg pulmonary ROS   Pulmonary exam normal breath sounds clear to auscultation       Cardiovascular hypertension, Pt. on medications and Pt. on home beta blockers + Past MI  Normal cardiovascular exam Rhythm:Regular Rate:Normal     Neuro/Psych  Neuromuscular disease  negative psych ROS   GI/Hepatic Neg liver ROS, hiatal hernia,GERD  Medicated and Controlled,,  Endo/Other  Hypothyroidism    Renal/GU negative Renal ROS  negative genitourinary   Musculoskeletal  (+) Arthritis , Osteoarthritis,    Abdominal   Peds negative pediatric ROS (+)  Hematology negative hematology ROS (+)   Anesthesia Other Findings   Reproductive/Obstetrics negative OB ROS                             Anesthesia Physical Anesthesia Plan  ASA: 3  Anesthesia Plan: General   Post-op Pain Management: Dilaudid IV   Induction: Intravenous  PONV Risk Score and Plan: 4 or greater and Dexamethasone and Ondansetron  Airway Management Planned: LMA  Additional Equipment:   Intra-op Plan:   Post-operative Plan: Extubation in OR  Informed Consent: I have reviewed the patients History and Physical, chart, labs and discussed the procedure including the risks, benefits and alternatives for the proposed anesthesia with the patient or authorized representative who has indicated his/her understanding and acceptance.     Dental advisory given  Plan Discussed with: CRNA and Surgeon  Anesthesia Plan Comments:        Anesthesia Quick Evaluation

## 2022-05-17 NOTE — Op Note (Signed)
    Knee arthroscopy dictation  The patient is a 72 year old female who was standing at election booth for 8 hours after that she started having pain in the medial side of her knee progressively worsened.  She had to do a lot of walking on a trip during the time at the airport and went to her primary care doctor she took some ibuprofen use the knee brace but did not improve she had an x-ray which was normal   She did have an MRI that shows she has a torn medial meniscus.  She would like to go ahead and proceed with arthroscopic surgery and remove the torn meniscal fragment from the left knee Preop diagnosis torn medial meniscus left knee  Postop diagnosis same  Procedure arthroscopy with partial medial meniscectomy  Surgeon Aline Brochure  Operative findings  MEDIAL - meniscus posterior horn tear medial meniscus with complex tear displaced fragment into the notch -articular surface small chondral fissure  LATERAL - meniscus normal -articular surface normal  PTF   -articular surface normal  NOTCH  -acl normal -pcl normal     The patient was identified in the preoperative holding area using 2 approved identification mechanisms. The chart was reviewed and updated. The surgical site was confirmed as left knee and marked with an indelible marker.  The patient was taken to the operating room for anesthesia. After successful general anesthesia, Ancef was used as IV antibiotics.  The patient was placed in the supine position with the (left) the operative extremity in an arthroscopic leg holder and the opposite extremity in a padded leg holder.  The timeout was executed.  A lateral portal was established with an 11 blade and the scope was introduced into the joint. A diagnostic arthroscopy was performed in circumferential manner examining the entire knee joint. A medial portal was established and the diagnostic arthroscopy was repeated using a probe to palpate intra-articular structures  as they were encountered.    The medial meniscus was resected using a duckbill forceps. The meniscal fragments were removed with a motorized shaver. The meniscus was balanced with a combination of a motorized shaver and a 50 ArthroCare wand until a stable rim was obtained.  The arthroscopic pump was placed on the wash mode and any excess debris was removed from the joint using suction.  60 cc of Marcaine with epinephrine was injected through the arthroscope.  The portals were closed with 3-0 nylon suture.  A sterile bandage, Ace wrap and Cryo/Cuff was placed and the Cryo/Cuff was activated. The patient was taken to the recovery room in stable condition.  PHYSICIAN ASSISTANT: no  ASSISTANTS: none   ANESTHESIA:   General  EBL:  none   BLOOD ADMINISTERED:none  DRAINS: none   LOCAL MEDICATIONS USED:  MARCAINE     SPECIMEN:  No Specimen  DISPOSITION OF SPECIMEN:  N/A  COUNTS:  YES   DICTATION: .Dragon Dictation  PLAN OF CARE: Discharge to home after PACU  PATIENT DISPOSITION:  PACU - hemodynamically stable.   Delay start of Pharmacological VTE agent (>24hrs) due to surgical blood loss or risk of bleeding: not applicable  POST OP PLAN  WB as tolerated SUTURES OUT IN A WEEK  AROM  BRACE none

## 2022-05-17 NOTE — Progress Notes (Signed)
Checked in on patient.  Informed her the doctor got a little behind on another case and he was finishing up and it shouldn't be too much longer before they take her back to surgery.  Patient calm, watching tv, voiced understanding.

## 2022-05-17 NOTE — Transfer of Care (Signed)
Immediate Anesthesia Transfer of Care Note  Patient: Mackenzie Mcpherson  Procedure(s) Performed: KNEE ARTHROSCOPY WITH MEDIAL MENISECTOMY (Left: Knee)  Patient Location: PACU  Anesthesia Type:General  Level of Consciousness: awake  Airway & Oxygen Therapy: Patient Spontanous Breathing and Patient connected to nasal cannula oxygen  Post-op Assessment: Report given to RN and Post -op Vital signs reviewed and stable  Post vital signs: Reviewed and stable  Last Vitals:  Vitals Value Taken Time  BP 162/75   Temp    Pulse 66 05/17/22 1507  Resp 10 05/17/22 1507  SpO2 100 % 05/17/22 1507  Vitals shown include unvalidated device data.  Last Pain:  Vitals:   05/17/22 1228  TempSrc: Oral  PainSc: 0-No pain         Complications: No notable events documented.

## 2022-05-17 NOTE — Anesthesia Postprocedure Evaluation (Signed)
Anesthesia Post Note  Patient: Mackenzie Mcpherson  Procedure(s) Performed: KNEE ARTHROSCOPY WITH MEDIAL MENISECTOMY (Left: Knee)  Patient location during evaluation: Phase II Anesthesia Type: General Level of consciousness: awake and alert and oriented Pain management: pain level controlled Vital Signs Assessment: post-procedure vital signs reviewed and stable Respiratory status: spontaneous breathing, nonlabored ventilation and respiratory function stable Cardiovascular status: blood pressure returned to baseline and stable Postop Assessment: no apparent nausea or vomiting Anesthetic complications: no  No notable events documented.   Last Vitals:  Vitals:   05/17/22 1515 05/17/22 1530  BP: (!) 156/85 (!) 176/68  Pulse: 67 70  Resp: 12 12  Temp:  36.7 C  SpO2: 100% 100%    Last Pain:  Vitals:   05/17/22 1535  TempSrc:   PainSc: 0-No pain                 Aela Bohan C Samarie Pinder

## 2022-05-18 DIAGNOSIS — Z9889 Other specified postprocedural states: Secondary | ICD-10-CM | POA: Insufficient documentation

## 2022-05-20 ENCOUNTER — Ambulatory Visit (INDEPENDENT_AMBULATORY_CARE_PROVIDER_SITE_OTHER): Payer: Federal, State, Local not specified - PPO | Admitting: Orthopedic Surgery

## 2022-05-20 ENCOUNTER — Encounter: Payer: Self-pay | Admitting: Orthopedic Surgery

## 2022-05-20 DIAGNOSIS — Z9889 Other specified postprocedural states: Secondary | ICD-10-CM

## 2022-05-20 NOTE — Progress Notes (Signed)
Chief Complaint  Patient presents with   Post-op Follow-up    SALK 05/17/22 improving    Status post arthroscopy left knee partial medial meniscectomy.  Sutures were removed today.  The patient only has a little bit of swelling in the knee she is bending it about 80 degrees  She can start active range of motion exercises and strengthening exercises continue ice return in 3 weeks for reevaluation

## 2022-05-23 ENCOUNTER — Encounter (HOSPITAL_COMMUNITY): Payer: Self-pay | Admitting: Orthopedic Surgery

## 2022-06-10 ENCOUNTER — Ambulatory Visit (INDEPENDENT_AMBULATORY_CARE_PROVIDER_SITE_OTHER): Payer: Federal, State, Local not specified - PPO | Admitting: Orthopedic Surgery

## 2022-06-10 DIAGNOSIS — Z9889 Other specified postprocedural states: Secondary | ICD-10-CM

## 2022-06-10 NOTE — Progress Notes (Signed)
Chief Complaint  Patient presents with   Follow-up    Recheck on left knee, DOS 05-17-22.   Briellah had a knee arthroscopy partial medial meniscectomy left knee she has some occasional night pain  She is regained her range of motion no swelling full weightbearing and walking without any limp  I recommend she do her exercises for 3 weeks and then follow-up as needed

## 2022-06-23 ENCOUNTER — Encounter: Payer: Self-pay | Admitting: Radiology

## 2022-07-06 ENCOUNTER — Other Ambulatory Visit (HOSPITAL_COMMUNITY): Payer: Self-pay | Admitting: Internal Medicine

## 2022-07-06 DIAGNOSIS — Z1231 Encounter for screening mammogram for malignant neoplasm of breast: Secondary | ICD-10-CM

## 2022-07-07 ENCOUNTER — Other Ambulatory Visit (HOSPITAL_COMMUNITY): Payer: Self-pay | Admitting: Internal Medicine

## 2022-07-07 DIAGNOSIS — Z13820 Encounter for screening for osteoporosis: Secondary | ICD-10-CM

## 2022-07-07 DIAGNOSIS — Z780 Asymptomatic menopausal state: Secondary | ICD-10-CM

## 2022-07-20 ENCOUNTER — Ambulatory Visit (HOSPITAL_COMMUNITY)
Admission: RE | Admit: 2022-07-20 | Discharge: 2022-07-20 | Disposition: A | Discharge status: Home / Self Care | Payer: Federal, State, Local not specified - PPO | Source: Ambulatory Visit | Attending: Internal Medicine | Admitting: Internal Medicine

## 2022-07-20 DIAGNOSIS — Z13820 Encounter for screening for osteoporosis: Secondary | ICD-10-CM

## 2022-07-20 DIAGNOSIS — Z780 Asymptomatic menopausal state: Secondary | ICD-10-CM | POA: Diagnosis present

## 2022-07-20 DIAGNOSIS — Z1231 Encounter for screening mammogram for malignant neoplasm of breast: Secondary | ICD-10-CM

## 2023-01-30 ENCOUNTER — Encounter (HOSPITAL_COMMUNITY): Payer: Self-pay

## 2023-01-30 ENCOUNTER — Other Ambulatory Visit: Payer: Self-pay

## 2023-01-30 ENCOUNTER — Emergency Department (HOSPITAL_COMMUNITY)
Admission: EM | Admit: 2023-01-30 | Discharge: 2023-01-30 | Disposition: A | Discharge status: Home / Self Care | Payer: Federal, State, Local not specified - PPO | Attending: Emergency Medicine | Admitting: Emergency Medicine

## 2023-01-30 ENCOUNTER — Emergency Department (HOSPITAL_COMMUNITY): Payer: Federal, State, Local not specified - PPO

## 2023-01-30 DIAGNOSIS — Z7982 Long term (current) use of aspirin: Secondary | ICD-10-CM | POA: Diagnosis not present

## 2023-01-30 DIAGNOSIS — M76891 Other specified enthesopathies of right lower limb, excluding foot: Secondary | ICD-10-CM

## 2023-01-30 DIAGNOSIS — M25561 Pain in right knee: Secondary | ICD-10-CM | POA: Diagnosis present

## 2023-01-30 DIAGNOSIS — M1711 Unilateral primary osteoarthritis, right knee: Secondary | ICD-10-CM | POA: Diagnosis not present

## 2023-01-30 DIAGNOSIS — M7651 Patellar tendinitis, right knee: Secondary | ICD-10-CM | POA: Insufficient documentation

## 2023-01-30 MED ORDER — LIDOCAINE 5 % EX PTCH
1.0000 | MEDICATED_PATCH | CUTANEOUS | Status: DC
  Administered 2023-01-30: 1 via TRANSDERMAL
  Filled 2023-01-30: qty 1

## 2023-01-30 MED ORDER — DICLOFENAC SODIUM 1 % EX GEL: 4.0000 g | Freq: Four times a day (QID) | CUTANEOUS | 0 refills | Status: DC | PRN

## 2023-01-30 MED ORDER — ACETAMINOPHEN 325 MG PO TABS
650.0000 mg | ORAL_TABLET | Freq: Once | ORAL | Status: AC
  Administered 2023-01-30: 650 mg via ORAL
  Filled 2023-01-30: qty 2

## 2023-01-30 MED ORDER — DICLOFENAC SODIUM 1 % EX GEL
4.0000 g | Freq: Once | CUTANEOUS | Status: DC
  Filled 2023-01-30: qty 100

## 2023-01-30 NOTE — ED Notes (Signed)
Knee brace applied to R knee, pt ambulated to the BR several feet with minimal assistance

## 2023-01-30 NOTE — ED Triage Notes (Signed)
Pt comes in with rt knee pain starting about 3-4 days ago after [place] the [place]. Pt states this morning knee is more stiff and hurts to stand and walk. Strength good when checked plantar.

## 2023-01-30 NOTE — Discharge Instructions (Addendum)
Believe that your pain in your knee is likely secondary to an arthritic flare, versus tendinitis that is flaring because you have been walking so much.  Please rest, elevate the leg, and use the brace as needed.  I prescribed you some Voltaren gel as well, to help with the pain, you can apply it 4 times daily as needed.  Return to the ER if you feel like your symptoms are worsening

## 2023-01-30 NOTE — ED Provider Notes (Signed)
[name] [facility] AT [facility] Provider Note   CSN: [id number] Arrival date & time: 01/30/23  1128     History  Chief Complaint  Patient presents with   Knee Pain    Mackenzie Mcpherson is a 72 y.o. female, past medical history, who presents to the ED secondary to right knee pain for the last day.  She states that she has been up in the mountains, and walking a lot, as well as going on daily walks with her husband, and that she woke up today, and the right knee was tender to the touch, and hurts to bear weight.  She denies any trauma to the area, no redness or swelling.  No recent falls.  No wounds as of lately.  Has taken ibuprofen with some relief.  States an 8 out of 10, and just very painful when bearing weight.  Home Medications Prior to Admission medications   Medication Sig Start Date End Date Taking? Authorizing Provider  diclofenac Sodium (VOLTAREN) 1 % GEL Apply 4 g topically 4 (four) times daily as needed. 01/30/23  Yes Tulani Kidney L, PA  acetaminophen (TYLENOL) 500 MG tablet Take 1 tablet (500 mg total) by mouth every 6 (six) hours as needed. 05/17/22   Vickki Hearing, MD  aspirin EC 81 MG EC tablet Take 1 tablet (81 mg total) by mouth daily. 05/12/18   Orpah Cobb, MD  b complex vitamins capsule Take 1 capsule by mouth daily.    [provider]  Biotin w/ Vitamins C & E (HAIR SKIN & NAILS GUMMIES PO) Take 2 tablets by mouth daily.    [provider]  BLACK CURRANT SEED OIL PO Take 1 drop by mouth daily.    [provider]  calcium carbonate (TUMS - DOSED IN MG ELEMENTAL CALCIUM) 500 MG chewable tablet Chew 500-1,000 mg by mouth 2 (two) times daily as needed for indigestion or heartburn.    [provider]  Cholecalciferol (VITAMIN D3) 5000 UNITS TABS Take 5,000 Units by mouth daily.    [provider]  fluticasone (FLONASE) 50 MCG/ACT nasal spray Place 1-2 sprays into both nostrils daily as needed  for allergies or rhinitis.    [provider]  ibuprofen (ADVIL) 800 MG tablet Take 1 tablet (800 mg total) by mouth every 8 (eight) hours as needed. 05/17/22   Vickki Hearing, MD  losartan-hydrochlorothiazide (HYZAAR) 100-12.5 MG tablet Take 1 tablet by mouth daily. 12/10/20   Elenore Paddy, NP  metoprolol tartrate (LOPRESSOR) 25 MG tablet TAKE 1/2 TABLET BY MOUTH TWICE DAILY 11/30/20   Elenore Paddy, NP  Multiple Vitamin (MULTIVITAMIN WITH MINERALS) TABS tablet Take 2 tablets by mouth daily.    [provider]  nitroGLYCERIN (NITROSTAT) 0.4 MG SL tablet Place 1 tablet (0.4 mg total) under the tongue every 5 (five) minutes x 3 doses as needed for chest pain. 12/10/20   Elenore Paddy, NP  Polyethyl Glycol-Propyl Glycol (SYSTANE OP) Place 1 drop into both eyes daily as needed (dry eyes).    [provider]  Probiotic Product (PROBIOTIC PO) Take 1 capsule by mouth daily.    [provider]  rosuvastatin (CRESTOR) 40 MG tablet Take 1 tablet (40 mg total) by mouth daily. 12/10/20   Elenore Paddy, NP  sodium chloride (OCEAN) 0.65 % SOLN nasal spray Place 1 spray into both nostrils at bedtime as needed for congestion.    [provider]  thyroid (ARMOUR) 60  MG tablet Take 60 mg by mouth daily before breakfast.    [provider]  TURMERIC PO Take 1 capsule by mouth daily.    [provider]  VITAMIN E PO Take 1 capsule by mouth daily.    [provider]      Allergies    Pantoprazole sodium    Review of Systems   Review of Systems  Musculoskeletal:        +R knee pain  Skin:  Negative for rash and wound.    Physical Exam Updated Vital Signs BP (!) 146/133 (BP Location: Right Arm)   Pulse 62   Temp 98.1 F (36.7 C)   Resp 18   Ht 4\' 11"  (1.499 m)   Wt 72.1 kg   SpO2 100%   BMI 32.11 kg/m  Physical Exam Vitals and nursing note reviewed.  Constitutional:      General: She is not in acute distress.    Appearance:  She is well-developed.  HENT:     Head: Normocephalic and atraumatic.  Eyes:     General:        Right eye: No discharge.        Left eye: No discharge.     Conjunctiva/sclera: Conjunctivae normal.  Pulmonary:     Effort: No respiratory distress.  Musculoskeletal:     Comments: Right Knee: Tenderness to palpation of medial joint line. An effusion is not present.  Negative anterior and posterior drawer. Negative Mcmurray's. +Patellar stability. Negative valgus and varus stress test.. Extension and flexion intact. No sensory deficits.    Neurological:     Mental Status: She is alert.     Comments: Clear speech.   Psychiatric:        Behavior: Behavior normal.        Thought Content: Thought content normal.     ED Results / Procedures / Treatments   Labs (all labs ordered are listed, but only abnormal results are displayed) Labs Reviewed - No data to display  EKG None  Radiology DG Knee Complete 4 Views Right  Result Date: 01/30/2023 CLINICAL DATA:  Knee pain. EXAM: RIGHT KNEE - COMPLETE 4+ VIEW COMPARISON:  None Available. FINDINGS: No acute fracture or dislocation. No aggressive osseous lesion. There are degenerative changes of the knee joint in the form of mildly reduced medial tibio-femoral compartment joint space, tibial spiking and osteophytosis. No knee effusion or focal soft tissue swelling. No radiopaque foreign bodies. IMPRESSION: 1. Mild medial tibio-femoral compartment osteoarthritis. Electronically Signed   By: Jules Schick M.D.   On: 01/30/2023 15:15    Procedures Procedures    Medications Ordered in ED Medications  lidocaine (LIDODERM) 5 % 1 patch (1 patch Transdermal Patch Applied 01/30/23 1518)  acetaminophen (TYLENOL) tablet 650 mg (650 mg Oral Given 01/30/23 1516)    ED Course/ Medical Decision Making/ A&P                                 Medical Decision Making Patient is a 72 year old female, here fo right knee pain, this been going on for the last  date, she states she has been walking more, and now she has had pain, walking for the last day.  She has tenderness to palpation of the right medial joint line, no laxity of joints.  Well-appearing.  No evidence of erythema, wound, edema.  Will obtain x-ray, suspicious for tendinitis versus arthritic in nature.  Amount and/or Complexity of Data Reviewed Radiology: ordered.    Details: X-ray shows medial tibial femoral, compartment osteoarthritis Discussion of management or test interpretation with external provider(s): Discussed with patient, x-ray shows osteoarthritis, given the pain, exacerbation after walking more, I think it started arthritic flare.  Will send her home with some diclofenac gel, and a knee brace.  We discussed follow-up with primary care doctor.  She has good pulses, no calf swelling, no erythema, warmth of the area or any effusion.  Risk OTC drugs. Prescription drug management.    Final Clinical Impression(s) / ED Diagnoses Final diagnoses:  Tendonitis of knee, right  Osteoarthritis of right knee, unspecified osteoarthritis type    Rx / DC Orders ED Discharge Orders          Ordered    diclofenac Sodium (VOLTAREN) 1 % GEL  4 times daily PRN        01/30/23 1521              Ileen Kahre L, PA 01/30/23 1524    Loetta Rough, MD 01/30/23 1552

## 2023-02-09 ENCOUNTER — Ambulatory Visit: Payer: Federal, State, Local not specified - PPO | Admitting: Orthopedic Surgery

## 2023-02-09 ENCOUNTER — Encounter: Payer: Self-pay | Admitting: Orthopedic Surgery

## 2023-02-09 ENCOUNTER — Other Ambulatory Visit (INDEPENDENT_AMBULATORY_CARE_PROVIDER_SITE_OTHER): Payer: Federal, State, Local not specified - PPO

## 2023-02-09 VITALS — BP 175/93 | HR 62 | Ht 59.0 in | Wt 160.0 lb

## 2023-02-09 DIAGNOSIS — M25561 Pain in right knee: Secondary | ICD-10-CM

## 2023-02-09 DIAGNOSIS — M23321 Other meniscus derangements, posterior horn of medial meniscus, right knee: Secondary | ICD-10-CM

## 2023-02-09 DIAGNOSIS — F430 Acute stress reaction: Secondary | ICD-10-CM

## 2023-02-09 NOTE — Patient Instructions (Signed)
Instructions  Wear brace when walking, use the cane until you come back to the doctor  Continue the alternation of medication Aleve or Advil with Tylenol

## 2023-02-09 NOTE — Progress Notes (Signed)
New problem Patient: Mackenzie Mcpherson           Date of Birth: November 02, 1950           MRN: [id number] Visit Date: 02/09/2023 Requested by: Benita Stabile, MD [address] [place],  [place] [address] PCP: Benita Stabile, MD   Chief Complaint  Patient presents with   Knee Pain    Right    Differential diagnosis  Name Primary?   Acute pain of right knee Yes   Stress reaction    Other meniscus derangements, posterior horn of medial meniscus, right knee     Assessment and plan  Differential diagnosis stress fracture medial tibial plateau, arthritis with torn medial meniscus, strain pes tendon medial collateral ligament.  Plan: Hinged knee brace NSAID Tylenol Cane Follow-up 2 weeks  Chief Complaint  Patient presents with   Knee Pain    Right     72 year old female presents with acute onset of right knee pain causing her to go to the emergency room on the 10th x-rays there showed no fracture.  She was given meloxicam but switched over to Aleve Tylenol and Voltaren gel with lessening symptoms  She said when she was putting her clothes on her knee popped and felt like something broke or came apart.  Pain is on the medial side of the joint sort of runs proximal and distal to and across the joint  She is now using a cane she has improved but is still not back to baseline    Body mass index is 32.32 kg/m.   Problem list, medical hx, medications and allergies reviewed      Allergies  Allergen Reactions   Pantoprazole Sodium Anaphylaxis and Hives    BP (!) 175/93   Pulse 62   Ht 4\' 11"  (1.499 m)   Wt 160 lb (72.6 kg)   BMI 32.32 kg/m    Physical exam: Physical Exam Vitals and nursing note reviewed.  Constitutional:      Appearance: Normal appearance.  HENT:     Head: Normocephalic and atraumatic.  Eyes:     General: No scleral icterus.       Right eye: No discharge.        Left eye: No discharge.     Extraocular Movements: Extraocular movements intact.      Conjunctiva/sclera: Conjunctivae normal.     Pupils: Pupils are equal, round, and reactive to light.  Cardiovascular:     Rate and Rhythm: Normal rate.     Pulses: Normal pulses.  Musculoskeletal:     Right knee: Effusion present.     Instability Tests: Medial McMurray test positive.  Skin:    General: Skin is warm and dry.     Capillary Refill: Capillary refill takes less than 2 seconds.  Neurological:     General: No focal deficit present.     Mental Status: She is alert and oriented to person, place, and time.  Psychiatric:        Mood and Affect: Mood normal.        Behavior: Behavior normal.        Thought Content: Thought content normal.        Judgment: Judgment normal.    Right Knee Exam   Tenderness  The patient is experiencing tenderness in the medial joint line and pes anserinus.  Range of Motion  Extension:  normal  Flexion:  normal   Tests  McMurray:  Medial - positive  Drawer:  Anterior - negative    Posterior - negative  Other  Erythema: absent Scars: absent Sensation: normal Pulse: present Swelling: none Effusion: effusion present     MSK:  Tenderness medial joint line positive McMurray's percussion tenderness over the proximal tibia range of motion normal no effusion  Data reviewed:   Image(s) reviewed with personal interpretation:  Out side x-rays show no fracture or dislocation functionable narrowing tibia femur indicating grade 1 degenerative changes;  internal imaging of the patella shows patellofemoral arthritis  Assessment and plan:  Encounter Diagnoses  Name Primary?   Acute pain of right knee Yes   Stress reaction    Other meniscus derangements, posterior horn of medial meniscus, right knee        No orders of the defined types were placed in this encounter.   Procedures:   no

## 2023-02-23 ENCOUNTER — Ambulatory Visit: Payer: Federal, State, Local not specified - PPO | Admitting: Orthopedic Surgery

## 2023-02-23 DIAGNOSIS — M25561 Pain in right knee: Secondary | ICD-10-CM

## 2023-02-23 NOTE — Progress Notes (Signed)
   VISIT TYPE: FOLLOW UP   Chief Complaint  Patient presents with   Knee Pain    Recheck on right knee pain.    Encounter Diagnosis  Name Primary?   Acute pain of right knee Yes    Assessment and Plan:  72 yo F right knee pain from trauma Continues with medial knee pain and + McMurrays despite rest Nsaids and bracing   Rec MRI   HPI: She may be getting a little better but still having pain still using a cane 72 year old female presents with acute onset of right knee pain causing her to go to the emergency room on the 10th x-rays there showed no fracture.  She was given meloxicam but switched over to Aleve Tylenol and Voltaren gel with lessening symptoms   She said when she was putting her clothes on her knee popped and felt like something broke or came apart.  Pain is on the medial side of the joint sort of runs proximal and distal to and across the joint   She is now using a cane she has improved but is still not back to baseline  There were no vitals taken for this visit.  Right Knee Exam   Tenderness  The patient is experiencing tenderness in the medial joint line.  Range of Motion  Extension:  normal  Flexion:  normal   Tests  McMurray:  Medial - positive  Drawer:  Anterior - negative    Posterior - negative  Other  Erythema: absent Scars: absent Sensation: normal Pulse: present Swelling: none Effusion: effusion present    Gait requiring a cane   Imaging see previous note mild OA on x-ray  A/P Encounter Diagnosis  Name Primary?   Acute pain of right knee Yes    No orders of the defined types were placed in this encounter.

## 2023-02-23 NOTE — Patient Instructions (Signed)
While we are working on your approval for MRI please go ahead and call to schedule your appointment with [facility] within at least one (1) week.   Central Scheduling [phone]  

## 2023-02-28 ENCOUNTER — Ambulatory Visit (HOSPITAL_COMMUNITY)
Admission: RE | Admit: 2023-02-28 | Discharge: 2023-02-28 | Disposition: A | Discharge status: Home / Self Care | Payer: Federal, State, Local not specified - PPO | Source: Ambulatory Visit | Attending: Orthopedic Surgery | Admitting: Orthopedic Surgery

## 2023-02-28 DIAGNOSIS — M25561 Pain in right knee: Secondary | ICD-10-CM | POA: Diagnosis present

## 2023-03-22 ENCOUNTER — Telehealth: Payer: Self-pay | Admitting: Orthopedic Surgery

## 2023-03-22 NOTE — Telephone Encounter (Signed)
Dr. Mort Sawyers pt - she wanted to let him know she was returning his call [phone]

## 2023-03-22 NOTE — Telephone Encounter (Signed)
MRI KNEE RESULTS GIVEN   SHE LL LET ME KNOW IF SHE WANTS SEMI ELECTIVE SURGERY

## 2023-04-10 ENCOUNTER — Ambulatory Visit: Payer: Federal, State, Local not specified - PPO | Admitting: Orthopedic Surgery

## 2023-04-10 VITALS — BP 136/80 | HR 72 | Ht 59.75 in | Wt 160.0 lb

## 2023-04-10 DIAGNOSIS — G8929 Other chronic pain: Secondary | ICD-10-CM

## 2023-04-10 DIAGNOSIS — Z01818 Encounter for other preprocedural examination: Secondary | ICD-10-CM

## 2023-04-10 DIAGNOSIS — M23321 Other meniscus derangements, posterior horn of medial meniscus, right knee: Secondary | ICD-10-CM | POA: Diagnosis not present

## 2023-04-10 NOTE — Progress Notes (Signed)
Follow-up visit  Pain right knee  Encounter Diagnoses  Name Primary?   Acute pain of right knee Yes   Other meniscus derangements, posterior horn of medial meniscus, right knee      Positive MRI for medial meniscus tear with some arthritis  After nonoperative treatment patient has decided to proceed with arthroscopy right knee with partial medial meniscectomy  History and physical be done at a later date is incorporated by reference  Her symptoms include medial knee pain, walking, difficulty going downstairs and catching  Allergies  Allergen Reactions   Pantoprazole Sodium Anaphylaxis and Hives    Past Medical History:  Diagnosis Date   Diverticulitis 2010   GERD (gastroesophageal reflux disease)    errosive esophagitis in 2010   Heart attack (HCC) 04/2018   spasm of RCA   Hemorrhoids    Hiatal hernia    Hypertension    Hypothyroidism    Schatzki's ring    last dilation 2010    Current Outpatient Medications  Medication Instructions   acetaminophen (TYLENOL) 500 mg, Oral, Every 6 hours PRN   aspirin EC 81 mg, Oral, Daily   b complex vitamins capsule 1 capsule, Daily   Biotin w/ Vitamins C & E (HAIR SKIN & NAILS GUMMIES PO) 2 tablets, Daily   BLACK CURRANT SEED OIL PO 1 drop, Daily   calcium carbonate (TUMS - DOSED IN MG ELEMENTAL CALCIUM) 500-1,000 mg, 2 times daily PRN   diclofenac Sodium (VOLTAREN) 4 g, Topical, 4 times daily PRN   ibuprofen (ADVIL) 800 mg, Oral, Every 8 hours PRN   losartan-hydrochlorothiazide (HYZAAR) 100-12.5 MG tablet 1 tablet, Oral, Daily   metoprolol tartrate (LOPRESSOR) 25 MG tablet TAKE 1/2 TABLET BY MOUTH TWICE DAILY   Multiple Vitamin (MULTIVITAMIN WITH MINERALS) TABS tablet 2 tablets, Daily   nitroGLYCERIN (NITROSTAT) 0.4 mg, Sublingual, Every 5 min x3 PRN   Polyethyl Glycol-Propyl Glycol (SYSTANE OP) 1 drop, Daily PRN   Probiotic Product (PROBIOTIC PO) 1 capsule, Daily   rosuvastatin (CRESTOR) 40 mg, Oral, Daily   sodium chloride  (OCEAN) 0.65 % SOLN nasal spray 1 spray, At bedtime PRN   thyroid (ARMOUR) 60 mg, Daily before breakfast   TURMERIC PO 1 capsule, Daily   Vitamin D3 5,000 Units, Daily   VITAMIN E PO 1 capsule, Daily    Past Surgical History:  Procedure Laterality Date   ABDOMINAL HYSTERECTOMY  1986   w/ unilateral salpingoopherectomy (can't remember which one)   APPENDECTOMY     CHOLECYSTECTOMY     COLONOSCOPY  11/19/2004   Normal rectum/Normal colon   COLONOSCOPY  12/11/09   Rourk-minimally friable anal canal/hemorrhoids otherwise normal   COLONOSCOPY N/A 02/11/2015   rourk: normal. next exam in 5 years.   COLONOSCOPY N/A 08/12/2020   Procedure: COLONOSCOPY;  Surgeon: Corbin Ade, MD;  Location: [facility];  Service: Endoscopy;  Laterality: N/A;  AM   ESOPHAGOGASTRODUODENOSCOPY  08/15/2008   Moderate size hiatal hernia/Noncritical Schatzki's ring with superimposed distal esophageal erosion consistent with erosive reflux esophagitis   ESOPHAGOGASTRODUODENOSCOPY N/A 06/11/2014   VQQ:[id number] ring dilated/HH   KNEE ARTHROSCOPY WITH MEDIAL MENISECTOMY Left 05/17/2022   Procedure: KNEE ARTHROSCOPY WITH MEDIAL MENISECTOMY;  Surgeon: Vickki Hearing, MD;  Location: [facility];  Service: Orthopedics;  Laterality: Left;   LEFT HEART CATH AND CORONARY ANGIOGRAPHY N/A 05/11/2018   Procedure: LEFT HEART CATH AND CORONARY ANGIOGRAPHY;  Surgeon: Orpah Cobb, MD;  Location: [facility];  Service: Cardiovascular;  Laterality: N/A;   POLYPECTOMY  08/12/2020   Procedure: POLYPECTOMY;  Surgeon: Corbin Ade, MD;  Location: [facility];  Service: Endoscopy;;    Family History  Problem Relation Age of Onset   Hypertension Father    Stroke Mother    Diabetes Mother    Colon cancer Brother 11   Colon cancer Brother 70   Prostate cancer Brother     Social History   Tobacco Use   Smoking status: Never   Smokeless tobacco: Never  Vaping Use   Vaping status: Never Used  Substance Use Topics    Alcohol use: No    Alcohol/week: 0.0 standard drinks of alcohol   Drug use: No

## 2023-04-10 NOTE — Addendum Note (Signed)
Addended byCaffie Damme on: 04/10/2023 03:07 PM   Modules accepted: Orders

## 2023-04-10 NOTE — Addendum Note (Signed)
Addended byCaffie Damme on: 04/10/2023 03:06 PM   Modules accepted: Orders

## 2023-04-10 NOTE — Patient Instructions (Signed)
 Your surgery will be at [facility] by Dr Romeo Apple  The hospital will contact you with a preoperative appointment to discuss Anesthesia.  Please arrive on time or 15 minutes early for the preoperative appointment, they have a very tight schedule if you are late or do not come in your surgery will be cancelled.  The phone number is [phone]. Please bring your medications with you for the appointment. They will tell you the arrival time and medication instructions when you have your preoperative evaluation. Do not wear nail polish the day of your surgery and if you take Phentermine you need to stop this medication ONE WEEK prior to your surgery. If you take Docia Barrier, Jardiance, or Steglatro) - Hold 72 hours before the procedure.  If you take Ozempic,  Mounjaro, Bydureon or Trulicity do not take for 8 days before your surgery. If you take Victoza, Rybelsis, Saxenda or Adlyxi stop 24 hours before the procedure.  Please arrive at the hospital 2 hours before procedure if scheduled at 9:30 or later in the day or at the time the nurse tells you at your preoperative visit.   If you have my chart do not use the time given in my chart use the time given to you by the nurse during your preoperative visit.   Your surgery  time may change. Please be available for phone calls the day of your surgery and the day before. The Short Stay department may need to discuss changes about your surgery time. Not reaching the you could lead to procedure delays and possible cancellation.  You must have a ride home and someone to stay with you for 24 to 48 hours. The person taking you home will receive and sign for the your discharge instructions.  Please be prepared to give your support person's name and telephone number to Central Registration. Dr Romeo Apple will need that name and phone number post procedure.

## 2023-05-04 NOTE — Patient Instructions (Signed)
 Your procedure is scheduled on: 05/09/2023  Report to [facility] Main Entrance at   8:30  AM.  Call this number if you have problems the morning of surgery: [phone]   Remember:   Do not Eat or Drink after midnight         No Smoking the morning of surgery  :  Take these medicines the morning of surgery with A SIP OF WATER : Metoprolol  and Armour(Thyroid )   Do not wear jewelry, make-up or nail polish.  Do not wear lotions, powders, or perfumes. You may wear deodorant.  Do not shave 48 hours prior to surgery. Men may shave face and neck.  Do not bring valuables to the hospital.  Contacts, dentures or bridgework may not be worn into surgery.  Leave suitcase in the car. After surgery it may be brought to your room.  For patients admitted to the hospital, checkout time is 11:00 AM the day of discharge.   Patients discharged the day of surgery will not be allowed to drive home.    Special Instructions: Shower using CHG night before surgery and shower the day of surgery use CHG.  Use special wash - you have one bottle of CHG for all showers.  You should use approximately 1/2 of the bottle for each shower. How to Use Chlorhexidine  Before Surgery Chlorhexidine  gluconate (CHG) is a germ-killing (antiseptic) solution that is used to clean the skin. It can get rid of the bacteria that normally live on the skin and can keep them away for about 24 hours. To clean your skin with CHG, you may be given: A CHG solution to use in the shower or as part of a sponge bath. A prepackaged cloth that contains CHG. Cleaning your skin with CHG may help lower the risk for infection: While you are staying in the intensive care unit of the hospital. If you have a vascular access, such as a central line, to provide short-term or long-term access to your veins. If you have a catheter to drain urine from your bladder. If you are on a ventilator. A ventilator is a machine that helps you breathe by moving air in  and out of your lungs. After surgery. What are the risks? Risks of using CHG include: A skin reaction. Hearing loss, if CHG gets in your ears and you have a perforated eardrum. Eye injury, if CHG gets in your eyes and is not rinsed out. The CHG product catching fire. Make sure that you avoid smoking and flames after applying CHG to your skin. Do not use CHG: If you have a chlorhexidine  allergy or have previously reacted to chlorhexidine . On babies younger than 66 months of age. How to use CHG solution Use CHG only as told by your health care provider, and follow the instructions on the label. Use the full amount of CHG as directed. Usually, this is one bottle. During a shower Follow these steps when using CHG solution during a shower (unless your health care provider gives you different instructions): Start the shower. Use your normal soap and shampoo to wash your face and hair. Turn off the shower or move out of the shower stream. Pour the CHG onto a clean washcloth. Do not use any type of brush or rough-edged sponge. Starting at your neck, lather your body down to your toes. Make sure you follow these instructions: If you will be having surgery, pay special attention to the part of your body where you will be having surgery.  Scrub this area for at least 1 minute. Do not use CHG on your head or face. If the solution gets into your ears or eyes, rinse them well with water . Avoid your genital area. Avoid any areas of skin that have broken skin, cuts, or scrapes. Scrub your back and under your arms. Make sure to wash skin folds. Let the lather sit on your skin for 1-2 minutes or as long as told by your health care provider. Thoroughly rinse your entire body in the shower. Make sure that all body creases and crevices are rinsed well. Dry off with a clean towel. Do not put any substances on your body afterward--such as powder, lotion, or perfume--unless you are told to do so by your health  care provider. Only use lotions that are recommended by the manufacturer. Put on clean clothes or pajamas. If it is the night before your surgery, sleep in clean sheets.  During a sponge bath Follow these steps when using CHG solution during a sponge bath (unless your health care provider gives you different instructions): Use your normal soap and shampoo to wash your face and hair. Pour the CHG onto a clean washcloth. Starting at your neck, lather your body down to your toes. Make sure you follow these instructions: If you will be having surgery, pay special attention to the part of your body where you will be having surgery. Scrub this area for at least 1 minute. Do not use CHG on your head or face. If the solution gets into your ears or eyes, rinse them well with water . Avoid your genital area. Avoid any areas of skin that have broken skin, cuts, or scrapes. Scrub your back and under your arms. Make sure to wash skin folds. Let the lather sit on your skin for 1-2 minutes or as long as told by your health care provider. Using a different clean, wet washcloth, thoroughly rinse your entire body. Make sure that all body creases and crevices are rinsed well. Dry off with a clean towel. Do not put any substances on your body afterward--such as powder, lotion, or perfume--unless you are told to do so by your health care provider. Only use lotions that are recommended by the manufacturer. Put on clean clothes or pajamas. If it is the night before your surgery, sleep in clean sheets. How to use CHG prepackaged cloths Only use CHG cloths as told by your health care provider, and follow the instructions on the label. Use the CHG cloth on clean, dry skin. Do not use the CHG cloth on your head or face unless your health care provider tells you to. When washing with the CHG cloth: Avoid your genital area. Avoid any areas of skin that have broken skin, cuts, or scrapes. Before surgery Follow these  steps when using a CHG cloth to clean before surgery (unless your health care provider gives you different instructions): Using the CHG cloth, vigorously scrub the part of your body where you will be having surgery. Scrub using a back-and-forth motion for 3 minutes. The area on your body should be completely wet with CHG when you are done scrubbing. Do not rinse. Discard the cloth and let the area air-dry. Do not put any substances on the area afterward, such as powder, lotion, or perfume. Put on clean clothes or pajamas. If it is the night before your surgery, sleep in clean sheets.  For general bathing Follow these steps when using CHG cloths for general bathing (unless your health care  provider gives you different instructions). Use a separate CHG cloth for each area of your body. Make sure you wash between any folds of skin and between your fingers and toes. Wash your body in the following order, switching to a new cloth after each step: The front of your neck, shoulders, and chest. Both of your arms, under your arms, and your hands. Your stomach and groin area, avoiding the genitals. Your right leg and foot. Your left leg and foot. The back of your neck, your back, and your buttocks. Do not rinse. Discard the cloth and let the area air-dry. Do not put any substances on your body afterward--such as powder, lotion, or perfume--unless you are told to do so by your health care provider. Only use lotions that are recommended by the manufacturer. Put on clean clothes or pajamas. Contact a health care provider if: Your skin gets irritated after scrubbing. You have questions about using your solution or cloth. You swallow any chlorhexidine . Call your local poison control center (307-188-2821 in the [place] Get help right away if: Your eyes itch badly, or they become very red or swollen. Your skin itches badly and is red or swollen. Your hearing changes. You have trouble seeing. You have  swelling or tingling in your mouth or throat. You have trouble breathing. These symptoms may represent a serious problem that is an emergency. Do not wait to see if the symptoms will go away. Get medical help right away. Call your local emergency services (911 in the [place] Do not drive yourself to the hospital. Summary Chlorhexidine  gluconate (CHG) is a germ-killing (antiseptic) solution that is used to clean the skin. Cleaning your skin with CHG may help to lower your risk for infection. You may be given CHG to use for bathing. It may be in a bottle or in a prepackaged cloth to use on your skin. Carefully follow your health care provider's instructions and the instructions on the product label. Do not use CHG if you have a chlorhexidine  allergy. Contact your health care provider if your skin gets irritated after scrubbing. This information is not intended to replace advice given to you by your health care provider. Make sure you discuss any questions you have with your health care provider. Document Revised: 08/09/2021 Document Reviewed: 06/22/2020 Elsevier Patient Education  2023 [employer] Knee Arthroscopy, Care After This sheet gives you information about how to care for yourself after your procedure. Your health care provider may also give you more specific instructions. If you have problems or questions, contact your health care provider. What can I expect after the procedure? After the procedure, it is common to have: Soreness. Swelling. Pain. A small amount of fluid from the incisions. Follow these instructions at home: Medicines Take over-the-counter and prescription medicines only as told by your health care provider. Ask your health care provider if the medicine prescribed to you: Requires you to avoid driving or using machinery. Can cause constipation. You may need to take these actions to prevent or treat constipation: Drink enough fluid to keep your urine pale  yellow. Take over-the-counter or prescription medicines. Eat foods that are high in fiber, such as beans, whole grains, and fresh fruits and vegetables. Limit foods that are high in fat and processed sugars, such as fried or sweet foods. If you have a brace or immobilizer: Wear it as told by your health care provider. Remove it only as told by your health care provider. Loosen it if your toes tingle, become  numb, or turn cold and blue. Keep it clean and dry. Bathing Do not take baths, swim, or use a hot tub until your health care provider approves. Ask your health care provider if you may take showers. Keep your bandage (dressing) dry until your health care provider says that it can be removed. If the brace or immobilizer is not waterproof: Do not let it get wet. Cover it with a watertight covering when you take a bath or shower. Incision care  Follow instructions from your health care provider about how to take care of your incisions. Make sure you: Wash your hands with soap and water  for at least 20 seconds before and after you change your dressing. If soap and water  are not available, use hand sanitizer. Change your dressing as told by your health care provider. Leave stitches (sutures) or adhesive strips in place. These skin closures may need to stay in place for 2 weeks or longer. If adhesive strip edges start to loosen and curl up, you may trim the loose edges. Do not remove adhesive strips completely unless your health care provider tells you to do that. Check your incision areas every day for signs of infection. Check for: Redness. More swelling or pain. Blood or more fluid. Warmth. Pus or a bad smell. Managing pain, stiffness, and swelling  If directed, put ice on the injured area. To do this: If you have a removable brace or immobilizer, remove it as told by your health care provider. Put ice in a plastic bag or use the icing device (cold therapy unit) that you were given.  Follow instructions about how to use the icing device. Place a towel between your skin and the bag or between your skin and the icing device. Leave the ice on for 20 minutes, 2-3 times a day. Remove the ice if your skin turns bright red. This is very important. If you cannot feel pain, heat, or cold, you have a greater risk of damage to the area. Move your toes often to reduce stiffness and swelling. Raise (elevate) the injured area above the level of your heart while you are sitting or lying down. Activity Do not use your knee to support your body weight until your health care provider says that you can. Follow weight-bearing restrictions as told. Use crutches or other devices to help you move around (assistive devices) as told by your health care provider. Ask your health care provider what activities are safe for you during recovery, and what activities you need to avoid. If physical therapy was prescribed, do exercises as told by your health care provider. Doing exercises may help improve knee movement, range of motion, and flexibility. Do not lift anything that is heavier than 10 lb (4.5 kg), or the limit that you are told, until your health care provider says that it is safe. General instructions Do not drive until your health care provider approves. You may be able to drive after 1-3 weeks. Do not use any products that contain nicotine or tobacco, such as cigarettes, e-cigarettes, and chewing tobacco. These can delay incision or bone healing after surgery. If you need help quitting, ask your health care provider. Wear compression stockings as told by your health care provider. These stockings help to prevent blood clots and reduce swelling in your legs. Keep all follow-up visits. This is important. Contact a health care provider if: You have any of these signs of infection: Redness or more pain around an incision. Blood or more fluid  coming from an incision. Warmth coming from an  incision. Pus or a bad smell coming from an incision. More swelling in your knee. A fever or chills. You have severe knee pain, and medicine does not help. An incision opens up. Get help right away if: You have trouble breathing or shortness of breath. You have chest pain. You develop pain or swelling in your lower leg or at the back of your knee. You have numbness or tingling in your lower leg or your foot. You notice that your foot or toes look darker than normal or are cooler than normal. These symptoms may represent a serious problem that is an emergency. Do not wait to see if the symptoms will go away. Get medical help right away. Call your local emergency services (911 in the [place] Do not drive yourself to the hospital. Summary To help relieve pain and swelling, put ice on the injured area for 20 minutes, 2-3 times a day. Raise (elevate) the injured area above the level of your heart while you are sitting or lying down. If physical therapy was prescribed, do exercises as told by your health care provider. Exercises may help improve range of motion. This information is not intended to replace advice given to you by your health care provider. Make sure you discuss any questions you have with your health care provider. Document Revised: 08/12/2019 Document Reviewed: 08/12/2019 Elsevier Patient Education  2024 [employer] General Anesthesia, Adult, Care After The following information offers guidance on how to care for yourself after your procedure. Your health care provider may also give you more specific instructions. If you have problems or questions, contact your health care provider. What can I expect after the procedure? After the procedure, it is common for people to: Have pain or discomfort at the IV site. Have nausea or vomiting. Have a sore throat or hoarseness. Have trouble concentrating. Feel cold or chills. Feel weak, sleepy, or tired (fatigue). Have soreness and body  aches. These can affect parts of the body that were not involved in surgery. Follow these instructions at home: For the time period you were told by your health care provider:  Rest. Do not participate in activities where you could fall or become injured. Do not drive or use machinery. Do not drink alcohol. Do not take sleeping pills or medicines that cause drowsiness. Do not make important decisions or sign legal documents. Do not take care of children on your own. General instructions Drink enough fluid to keep your urine pale yellow. If you have sleep apnea, surgery and certain medicines can increase your risk for breathing problems. Follow instructions from your health care provider about wearing your sleep device: Anytime you are sleeping, including during daytime naps. While taking prescription pain medicines, sleeping medicines, or medicines that make you drowsy. Return to your normal activities as told by your health care provider. Ask your health care provider what activities are safe for you. Take over-the-counter and prescription medicines only as told by your health care provider. Do not use any products that contain nicotine or tobacco. These products include cigarettes, chewing tobacco, and vaping devices, such as e-cigarettes. These can delay incision healing after surgery. If you need help quitting, ask your health care provider. Contact a health care provider if: You have nausea or vomiting that does not get better with medicine. You vomit every time you eat or drink. You have pain that does not get better with medicine. You cannot urinate or have bloody  urine. You develop a skin rash. You have a fever. Get help right away if: You have trouble breathing. You have chest pain. You vomit blood. These symptoms may be an emergency. Get help right away. Call 911. Do not wait to see if the symptoms will go away. Do not drive yourself to the hospital. Summary After the  procedure, it is common to have a sore throat, hoarseness, nausea, vomiting, or to feel weak, sleepy, or fatigue. For the time period you were told by your health care provider, do not drive or use machinery. Get help right away if you have difficulty breathing, have chest pain, or vomit blood. These symptoms may be an emergency. This information is not intended to replace advice given to you by your health care provider. Make sure you discuss any questions you have with your health care provider. Document Revised: 07/09/2021 Document Reviewed: 07/09/2021 Elsevier Patient Education  2024 [employer].

## 2023-05-05 ENCOUNTER — Encounter (HOSPITAL_COMMUNITY): Payer: Self-pay

## 2023-05-05 ENCOUNTER — Encounter (HOSPITAL_COMMUNITY)
Admission: RE | Admit: 2023-05-05 | Discharge: 2023-05-05 | Disposition: A | Discharge status: Home / Self Care | Payer: Federal, State, Local not specified - PPO | Source: Ambulatory Visit | Attending: Orthopedic Surgery | Admitting: Orthopedic Surgery

## 2023-05-05 VITALS — BP 140/81 | HR 57 | Temp 97.7°F | Resp 18 | Ht 59.0 in | Wt 160.1 lb

## 2023-05-05 DIAGNOSIS — Z01810 Encounter for preprocedural cardiovascular examination: Secondary | ICD-10-CM | POA: Diagnosis present

## 2023-05-05 DIAGNOSIS — I10 Essential (primary) hypertension: Secondary | ICD-10-CM | POA: Insufficient documentation

## 2023-05-05 DIAGNOSIS — Z01812 Encounter for preprocedural laboratory examination: Secondary | ICD-10-CM | POA: Diagnosis present

## 2023-05-05 DIAGNOSIS — M23321 Other meniscus derangements, posterior horn of medial meniscus, right knee: Secondary | ICD-10-CM | POA: Insufficient documentation

## 2023-05-05 DIAGNOSIS — Z01818 Encounter for other preprocedural examination: Secondary | ICD-10-CM | POA: Insufficient documentation

## 2023-05-05 HISTORY — DX: Unspecified osteoarthritis, unspecified site: M19.90

## 2023-05-05 LAB — CBC WITH DIFFERENTIAL/PLATELET
Abs Immature Granulocytes: 0.01 10*3/uL (ref 0.00–0.07)
Basophils Absolute: 0 10*3/uL (ref 0.0–0.1)
Basophils Relative: 1 %
Eosinophils Absolute: 0.1 10*3/uL (ref 0.0–0.5)
Eosinophils Relative: 2 %
HCT: 39.3 % (ref 36.0–46.0)
Hemoglobin: 12.5 g/dL (ref 12.0–15.0)
Immature Granulocytes: 0 %
Lymphocytes Relative: 31 %
Lymphs Abs: 1.3 10*3/uL (ref 0.7–4.0)
MCH: 28.5 pg (ref 26.0–34.0)
MCHC: 31.8 g/dL (ref 30.0–36.0)
MCV: 89.5 fL (ref 80.0–100.0)
Monocytes Absolute: 0.3 10*3/uL (ref 0.1–1.0)
Monocytes Relative: 8 %
Neutro Abs: 2.4 10*3/uL (ref 1.7–7.7)
Neutrophils Relative %: 58 %
Platelets: 214 10*3/uL (ref 150–400)
RBC: 4.39 MIL/uL (ref 3.87–5.11)
RDW: 14.6 % (ref 11.5–15.5)
WBC: 4.1 10*3/uL (ref 4.0–10.5)
nRBC: 0 % (ref 0.0–0.2)

## 2023-05-05 LAB — BASIC METABOLIC PANEL
Anion gap: 9 (ref 5–15)
BUN: 13 mg/dL (ref 8–23)
CO2: 26 mmol/L (ref 22–32)
Calcium: 9.1 mg/dL (ref 8.9–10.3)
Chloride: 102 mmol/L (ref 98–111)
Creatinine, Ser: 0.82 mg/dL (ref 0.44–1.00)
GFR, Estimated: >60 mL/min (ref >60)
Glucose, Bld: 98 mg/dL (ref 70–99)
Potassium: 3.5 mmol/L (ref 3.5–5.1)
Sodium: 137 mmol/L (ref 135–145)

## 2023-05-05 NOTE — Pre-Procedure Instructions (Deleted)
 Called patient for pre-op call. She states she did not receive prep instructions. I printed prep instructions and all other instructions for her daughter to pick up.

## 2023-05-05 NOTE — H&P (Signed)
 History and physical for arthroscopic surgery and outpatient surgery on the right knee  Arthroscopy right knee partial medial meniscectomy   Pain right knee       Encounter Diagnoses  Name Primary?   Acute pain of right knee Yes   Other meniscus derangements, posterior horn of medial meniscus, right knee       This is a 73 year old female who injured her right knee we tried some nonoperative care including bracing anti-inflammatories exercises but she continued to have pain.  She went for MRI it shows she had a meniscal tear she tried to manage this with nonoperative care even after the results were given but has succumbed to the disabling features of the meniscal tear and would like to proceed with arthroscopic surgery to remove the torn meniscal tissue  Her symptoms include medial knee pain, walking, difficulty going downstairs and catching    -- Pantoprazole Sodium -- Anaphylaxis and Hives    Review of Systems  Constitutional:  Negative for fever.  Respiratory:  Negative for shortness of breath.   Cardiovascular:  Negative for chest pain.  Skin: Negative.   Neurological:  Negative for tingling and sensory change.       Physical Exam Vitals and nursing note reviewed.  Constitutional:      General: She is not in acute distress.    Appearance: Normal appearance. She is not ill-appearing, toxic-appearing or diaphoretic.  HENT:     Head: Normocephalic and atraumatic.     Nose: Nose normal. No congestion or rhinorrhea.  Eyes:     General: No scleral icterus.       Right eye: No discharge.        Left eye: No discharge.     Extraocular Movements: Extraocular movements intact.     Conjunctiva/sclera: Conjunctivae normal.     Pupils: Pupils are equal, round, and reactive to light.  Cardiovascular:     Pulses: Normal pulses.  Pulmonary:     Effort: Pulmonary effort is normal.     Breath sounds: No wheezing.  Musculoskeletal:       Legs:     Comments: Right knee skin  clean dry and intact no erythema no prior incisions  Tenderness primarily medial joint line, effusion mild.  Range of motion slight decrease in extension full flexion with pain at terminal range of motion in each direction  Instability none all ligaments stable  Muscle tone and strength normal no atrophy no tremor  Neurovascular exam of the extremity is normal good pulse perfusion color capillary refill  Skin:    General: Skin is warm and dry.     Capillary Refill: Capillary refill takes less than 2 seconds.     Coloration: Skin is not jaundiced.     Findings: No erythema.  Neurological:     General: No focal deficit present.     Mental Status: She is alert and oriented to person, place, and time.  Psychiatric:        Mood and Affect: Mood normal.        Behavior: Behavior normal.        Thought Content: Thought content normal.        Judgment: Judgment normal.      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/2018    spasm of RCA   Hemorrhoids     Hiatal hernia     Hypertension     Hypothyroidism     Schatzki's ring      last dilation 2010              Current Outpatient Medications  Medication Instructions   acetaminophen  (TYLENOL ) 500 mg, Oral, Every 6 hours PRN   aspirin  EC 81 mg, Oral, Daily   b complex vitamins capsule 1 capsule, Daily   Biotin w/ Vitamins C & E (HAIR SKIN & NAILS GUMMIES PO) 2 tablets, Daily   BLACK CURRANT SEED OIL PO 1 drop, Daily   calcium  carbonate (TUMS - DOSED IN MG ELEMENTAL CALCIUM ) 500-1,000 mg, 2 times daily PRN   diclofenac  Sodium (VOLTAREN ) 4 g, Topical, 4 times daily PRN   ibuprofen  (ADVIL ) 800 mg, Oral, Every 8 hours PRN   losartan -hydrochlorothiazide (HYZAAR) 100-12.5 MG tablet 1 tablet, Oral, Daily    metoprolol  tartrate (LOPRESSOR ) 25 MG tablet TAKE 1/2 TABLET BY MOUTH TWICE DAILY   Multiple Vitamin (MULTIVITAMIN WITH MINERALS) TABS tablet 2 tablets, Daily   nitroGLYCERIN  (NITROSTAT ) 0.4 mg, Sublingual, Every 5 min x3 PRN   Polyethyl Glycol-Propyl Glycol (SYSTANE OP) 1 drop, Daily PRN   Probiotic Product (PROBIOTIC PO) 1 capsule, Daily   rosuvastatin  (CRESTOR ) 40 mg, Oral, Daily   sodium chloride  (OCEAN) 0.65 % SOLN nasal spray 1 spray, At bedtime PRN   thyroid  (ARMOUR) 60 mg, Daily before breakfast   TURMERIC PO 1 capsule, Daily   Vitamin D3 5,000 Units, Daily   VITAMIN E PO 1 capsule, Daily           Past Surgical History:  Procedure Laterality Date   ABDOMINAL HYSTERECTOMY   1986    w/ unilateral salpingoopherectomy (can't remember which one)   APPENDECTOMY       CHOLECYSTECTOMY       COLONOSCOPY   11/19/2004    Normal rectum/Normal colon   COLONOSCOPY   12/11/09    Rourk-minimally friable anal canal/hemorrhoids otherwise normal   COLONOSCOPY N/A 02/11/2015    rourk: normal. next exam in 5 years.   COLONOSCOPY N/A 08/12/2020    Procedure: COLONOSCOPY;  Surgeon: Shaaron Lamar HERO, MD;  Location: [facility];  Service: Endoscopy;  Laterality: N/A;  AM   ESOPHAGOGASTRODUODENOSCOPY   08/15/2008    Moderate size hiatal hernia/Noncritical Schatzki's ring with superimposed distal esophageal erosion consistent with erosive reflux esophagitis   ESOPHAGOGASTRODUODENOSCOPY N/A 06/11/2014    MFM:dryjusxp'd ring dilated/HH   KNEE ARTHROSCOPY WITH MEDIAL MENISECTOMY Left 05/17/2022    Procedure: KNEE ARTHROSCOPY WITH MEDIAL MENISECTOMY;  Surgeon: Margrette Taft BRAVO, MD;  Location: [facility];  Service: Orthopedics;  Laterality: Left;   LEFT HEART CATH AND CORONARY ANGIOGRAPHY N/A 05/11/2018    Procedure: LEFT HEART CATH AND CORONARY ANGIOGRAPHY;  Surgeon: Claudene Pacific, MD;  Location: [facility];  Service: Cardiovascular;  Laterality: N/A;   POLYPECTOMY   08/12/2020    Procedure:  POLYPECTOMY;  Surgeon: Shaaron Lamar HERO, MD;  Location: [facility];  Service: Endoscopy;;               Family History  Problem Relation Age of Onset   Hypertension Father     Stroke Mother     Diabetes Mother     Colon cancer Brother 56   Colon cancer Brother 54   Prostate cancer Brother  Social History  Social History         Tobacco Use   Smoking status: Never   Smokeless tobacco: Never  Vaping Use   Vaping status: Never Used  Substance Use Topics   Alcohol use: No      Alcohol/week: 0.0 standard drinks of alcohol   Drug use: No

## 2023-05-09 ENCOUNTER — Ambulatory Visit (HOSPITAL_COMMUNITY)
Admission: RE | Admit: 2023-05-09 | Discharge: 2023-05-09 | Disposition: A | Discharge status: Home / Self Care | Payer: Federal, State, Local not specified - PPO | Attending: Orthopedic Surgery | Admitting: Orthopedic Surgery

## 2023-05-09 ENCOUNTER — Encounter (HOSPITAL_COMMUNITY): Payer: Self-pay | Admitting: Orthopedic Surgery

## 2023-05-09 ENCOUNTER — Ambulatory Visit (HOSPITAL_COMMUNITY): Payer: Self-pay | Admitting: Certified Registered Nurse Anesthetist

## 2023-05-09 ENCOUNTER — Encounter (HOSPITAL_COMMUNITY)
Admission: RE | Disposition: A | Discharge status: Home / Self Care | Payer: Self-pay | Source: Home / Self Care | Attending: Orthopedic Surgery

## 2023-05-09 DIAGNOSIS — K219 Gastro-esophageal reflux disease without esophagitis: Secondary | ICD-10-CM | POA: Diagnosis not present

## 2023-05-09 DIAGNOSIS — Z79899 Other long term (current) drug therapy: Secondary | ICD-10-CM | POA: Insufficient documentation

## 2023-05-09 DIAGNOSIS — S83241A Other tear of medial meniscus, current injury, right knee, initial encounter: Secondary | ICD-10-CM | POA: Diagnosis present

## 2023-05-09 DIAGNOSIS — W19XXXA Unspecified fall, initial encounter: Secondary | ICD-10-CM | POA: Insufficient documentation

## 2023-05-09 DIAGNOSIS — K449 Diaphragmatic hernia without obstruction or gangrene: Secondary | ICD-10-CM | POA: Insufficient documentation

## 2023-05-09 DIAGNOSIS — I10 Essential (primary) hypertension: Secondary | ICD-10-CM | POA: Diagnosis not present

## 2023-05-09 DIAGNOSIS — I252 Old myocardial infarction: Secondary | ICD-10-CM | POA: Insufficient documentation

## 2023-05-09 DIAGNOSIS — E039 Hypothyroidism, unspecified: Secondary | ICD-10-CM | POA: Diagnosis not present

## 2023-05-09 HISTORY — PX: KNEE ARTHROSCOPY WITH MEDIAL MENISECTOMY: SHX5651

## 2023-05-09 SURGERY — ARTHROSCOPY, KNEE, WITH MEDIAL MENISCECTOMY
Anesthesia: General | Site: Knee | Laterality: Right

## 2023-05-09 MED ORDER — SEVOFLURANE IN SOLN
RESPIRATORY_TRACT | Status: AC
  Filled 2023-05-09: qty 250

## 2023-05-09 MED ORDER — ONDANSETRON HCL 4 MG/2ML IJ SOLN: 4.0000 mg | Freq: Once | INTRAMUSCULAR | Status: DC | PRN

## 2023-05-09 MED ORDER — SODIUM CHLORIDE 0.9 % IR SOLN
Status: DC | PRN
  Administered 2023-05-09 (×2): 3000 mL

## 2023-05-09 MED ORDER — PROPOFOL 10 MG/ML IV BOLUS
INTRAVENOUS | Status: DC | PRN
  Administered 2023-05-09: 160 mg via INTRAVENOUS

## 2023-05-09 MED ORDER — LACTATED RINGERS IV SOLN: INTRAVENOUS | Status: AC

## 2023-05-09 MED ORDER — BUPIVACAINE-EPINEPHRINE (PF) 0.5% -1:200000 IJ SOLN
INTRAMUSCULAR | Status: DC | PRN
  Administered 2023-05-09: 30 mL via PERINEURAL

## 2023-05-09 MED ORDER — OXYCODONE HCL 5 MG PO TABS: 5.0000 mg | ORAL_TABLET | Freq: Once | ORAL | Status: DC | PRN

## 2023-05-09 MED ORDER — OXYCODONE HCL 5 MG/5ML PO SOLN: 5.0000 mg | Freq: Once | ORAL | Status: DC | PRN

## 2023-05-09 MED ORDER — EPINEPHRINE PF 1 MG/ML IJ SOLN
INTRAMUSCULAR | Status: AC
  Filled 2023-05-09: qty 6

## 2023-05-09 MED ORDER — FENTANYL CITRATE PF 50 MCG/ML IJ SOSY
25.0000 ug | PREFILLED_SYRINGE | INTRAMUSCULAR | Status: DC | PRN
  Administered 2023-05-09: 50 ug via INTRAVENOUS
  Filled 2023-05-09: qty 1

## 2023-05-09 MED ORDER — ONDANSETRON HCL 4 MG/2ML IJ SOLN: 4.0000 mg | Freq: Four times a day (QID) | INTRAMUSCULAR | Status: DC

## 2023-05-09 MED ORDER — HYDROCODONE-ACETAMINOPHEN 5-325 MG PO TABS: 1.0000 | ORAL_TABLET | Freq: Four times a day (QID) | ORAL | 0 refills | Status: AC | PRN

## 2023-05-09 MED ORDER — ONDANSETRON HCL 4 MG/2ML IJ SOLN
INTRAMUSCULAR | Status: AC
  Filled 2023-05-09: qty 2

## 2023-05-09 MED ORDER — ACETAMINOPHEN 500 MG PO TABS: 500.0000 mg | ORAL_TABLET | Freq: Four times a day (QID) | ORAL | 0 refills | Status: AC | PRN

## 2023-05-09 MED ORDER — BUPIVACAINE-EPINEPHRINE (PF) 0.5% -1:200000 IJ SOLN
INTRAMUSCULAR | Status: AC
  Filled 2023-05-09: qty 30

## 2023-05-09 MED ORDER — HYDROCODONE-ACETAMINOPHEN 5-325 MG PO TABS
1.0000 | ORAL_TABLET | ORAL | Status: DC | PRN
Start: 2023-05-09 — End: 2023-05-09

## 2023-05-09 MED ORDER — LACTATED RINGERS IV SOLN: INTRAVENOUS | Status: DC | PRN

## 2023-05-09 MED ORDER — PROPOFOL 10 MG/ML IV BOLUS
INTRAVENOUS | Status: AC
Start: 2023-05-09 — End: ?
  Filled 2023-05-09: qty 20

## 2023-05-09 MED ORDER — FENTANYL CITRATE (PF) 100 MCG/2ML IJ SOLN
INTRAMUSCULAR | Status: DC | PRN
  Administered 2023-05-09: 25 ug via INTRAVENOUS
  Administered 2023-05-09: 50 ug via INTRAVENOUS
  Administered 2023-05-09: 25 ug via INTRAVENOUS

## 2023-05-09 MED ORDER — IBUPROFEN 800 MG PO TABS: 800.0000 mg | ORAL_TABLET | Freq: Three times a day (TID) | ORAL | 1 refills | Status: AC | PRN

## 2023-05-09 MED ORDER — IBUPROFEN 800 MG PO TABS
ORAL_TABLET | ORAL | Status: AC
  Filled 2023-05-09: qty 1

## 2023-05-09 MED ORDER — IBUPROFEN 800 MG PO TABS
800.0000 mg | ORAL_TABLET | Freq: Once | ORAL | Status: AC
  Administered 2023-05-09: 800 mg via ORAL

## 2023-05-09 MED ORDER — FENTANYL CITRATE (PF) 100 MCG/2ML IJ SOLN
INTRAMUSCULAR | Status: AC
  Filled 2023-05-09: qty 2

## 2023-05-09 MED ORDER — LIDOCAINE HCL (CARDIAC) PF 100 MG/5ML IV SOSY
PREFILLED_SYRINGE | INTRAVENOUS | Status: DC | PRN
  Administered 2023-05-09: 50 mg via INTRATRACHEAL

## 2023-05-09 MED ORDER — CEFAZOLIN SODIUM-DEXTROSE 2-4 GM/100ML-% IV SOLN
2.0000 g | INTRAVENOUS | Status: AC
  Administered 2023-05-09: 2 g via INTRAVENOUS
  Filled 2023-05-09: qty 100

## 2023-05-09 SURGICAL SUPPLY — 44 items
ABLATOR ASPIRATE 50D MULTI-PRT (SURGICAL WAND) IMPLANT
BAG HAMPER (MISCELLANEOUS) ×1 IMPLANT
BANDAGE ESMARK 6X9 LF (GAUZE/BANDAGES/DRESSINGS) ×1 IMPLANT
BNDG ELASTIC 6X5.8 VLCR NS LF (GAUZE/BANDAGES/DRESSINGS) ×1 IMPLANT
BNDG ESMARK 6X9 LF (GAUZE/BANDAGES/DRESSINGS) ×1
CHLORAPREP W/TINT 26 (MISCELLANEOUS) ×1 IMPLANT
CLOTH BEACON ORANGE TIMEOUT ST (SAFETY) ×1 IMPLANT
COOLER ICEMAN CLASSIC (MISCELLANEOUS) ×1 IMPLANT
COUNTER NDL MAGNETIC 40 RED (SET/KITS/TRAYS/PACK) ×1 IMPLANT
COUNTER NEEDLE MAGNETIC 40 RED (SET/KITS/TRAYS/PACK) ×1
COVER LIGHT HANDLE STERIS (MISCELLANEOUS) ×2 IMPLANT
GAUZE SPONGE 4X4 12PLY STRL (GAUZE/BANDAGES/DRESSINGS) ×1 IMPLANT
GAUZE XEROFORM 1X8 LF (GAUZE/BANDAGES/DRESSINGS) IMPLANT
GLOVE BIO SURGEON STRL SZ7 (GLOVE) IMPLANT
GLOVE BIOGEL PI IND STRL 7.0 (GLOVE) ×2 IMPLANT
GLOVE SS N UNI LF 8.5 STRL (GLOVE) ×1 IMPLANT
GLOVE SURG POLYISO LF SZ8 (GLOVE) ×1 IMPLANT
GOWN STRL REUS W/TWL LRG LVL3 (GOWN DISPOSABLE) ×1 IMPLANT
GOWN STRL REUS W/TWL XL LVL3 (GOWN DISPOSABLE) ×1 IMPLANT
IV NS IRRIG 3000ML ARTHROMATIC (IV SOLUTION) ×2 IMPLANT
KIT TURNOVER CYSTO (KITS) ×1 IMPLANT
MANIFOLD NEPTUNE II (INSTRUMENTS) ×1 IMPLANT
MARKER SKIN DUAL TIP RULER LAB (MISCELLANEOUS) ×1 IMPLANT
NDL HYPO 18GX1.5 BLUNT FILL (NEEDLE) ×1 IMPLANT
NDL HYPO 21X1.5 SAFETY (NEEDLE) ×1 IMPLANT
NDL SPNL 18GX3.5 QUINCKE PK (NEEDLE) ×1 IMPLANT
NEEDLE HYPO 18GX1.5 BLUNT FILL (NEEDLE) ×1
NEEDLE HYPO 21X1.5 SAFETY (NEEDLE) ×1
NEEDLE SPNL 18GX3.5 QUINCKE PK (NEEDLE) ×1
PACK ARTHROSCOPY WL (CUSTOM PROCEDURE TRAY) ×1 IMPLANT
PAD ABD 5X9 TENDERSORB (GAUZE/BANDAGES/DRESSINGS) ×1 IMPLANT
PAD ARMBOARD 7.5X6 YLW CONV (MISCELLANEOUS) ×1 IMPLANT
PAD COLD SHLDR SM WRAP-ON (PAD) IMPLANT
PAD FOR LEG HOLDER (MISCELLANEOUS) ×1 IMPLANT
PADDING CAST COTTON 6X4 STRL (CAST SUPPLIES) ×1 IMPLANT
POSITIONER HEAD 8X9X4 ADT (SOFTGOODS) ×1 IMPLANT
RESECTOR TORPEDO 4MM 13CM CVD (MISCELLANEOUS) IMPLANT
SET ARTHROSCOPY INST (INSTRUMENTS) ×1 IMPLANT
SET BASIN LINEN APH (SET/KITS/TRAYS/PACK) ×1 IMPLANT
SUT 3-0 BLK 1X30 PSL (SUTURE) ×1 IMPLANT
SYR 10ML LL (SYRINGE) ×1 IMPLANT
SYR 30ML LL (SYRINGE) ×3 IMPLANT
TUBE CONNECTING 12X1/4 (SUCTIONS) ×1 IMPLANT
TUBING IN/OUT FLOW W/MAIN PUMP (TUBING) ×1 IMPLANT

## 2023-05-09 NOTE — Anesthesia Postprocedure Evaluation (Signed)
 Anesthesia Post Note  Patient: Mackenzie Mcpherson  Procedure(s) Performed: KNEE ARTHROSCOPY WITH MEDIAL MENISECTOMY (Right: Knee)  Patient location during evaluation: PACU Anesthesia Type: General Level of consciousness: awake and alert Pain management: pain level controlled Vital Signs Assessment: post-procedure vital signs reviewed and stable Respiratory status: spontaneous breathing, nonlabored ventilation, respiratory function stable and patient connected to nasal cannula oxygen Cardiovascular status: blood pressure returned to baseline and stable Postop Assessment: no apparent nausea or vomiting Anesthetic complications: no   There were no known notable events for this encounter.   Last Vitals:  Vitals:   05/09/23 1231 05/09/23 1240  BP: (!) 141/66 133/60  Pulse: 63 60  Resp: 13 12  Temp:  36.4 C  SpO2: 98% 98%    Last Pain:  Vitals:   05/09/23 1240  TempSrc: Oral  PainSc: 3                  Marlan Steward L Kemora Pinard

## 2023-05-09 NOTE — Op Note (Signed)
 Knee arthroscopy dictation  Orthopaedic Surgery Operative Note (CSN: [id number])  Mackenzie Mcpherson  [birth year] Date of Surgery: 05/09/2023 05/09/2023  11:26 AM  PATIENT:  Mackenzie Mcpherson  73 y.o. female  PRE-OPERATIVE DIAGNOSIS:  torn medial mensicus right knee  POST-OPERATIVE DIAGNOSIS:  torn medial mensicus right knee  PROCEDURE:  Procedure(s): KNEE ARTHROSCOPY WITH MEDIAL MENISECTOMY (Right)  SURGEON:  Surgeons and Role:    DEWAINE Margrette Taft FORBES, MD - Primary  PHYSICIAN ASSISTANT:   ASSISTANTS: none   ANESTHESIA:   general  EBL:  20 mL   BLOOD ADMINISTERED:none  DRAINS: none   LOCAL MEDICATIONS USED:  MARCAINE      SPECIMEN:  No Specimen  DISPOSITION OF SPECIMEN:  N/A  COUNTS:  YES  TOURNIQUET:  * No tourniquets in log *  DICTATION: .Dragon Dictation  PLAN OF CARE: Discharge to home after PACU  PATIENT DISPOSITION:  PACU - hemodynamically stable.   Delay start of Pharmacological VTE agent (>24hrs) due to surgical blood loss or risk of bleeding: not applicable  Operative findings  MEDIAL - meniscus 2 tears  -articular surface small fissure   LATERAL - meniscus normal  -articular surface normal   PTF   -articular surface normal   NOTCH  -acl normal  -pcl normal      The patient was identified in the preoperative holding area using 2 approved identification mechanisms. The chart was reviewed and updated. The surgical site was confirmed as right knee  knee and marked with an indelible marker.  The patient was taken to the operating room for anesthesia. After successful  general  anesthesia, ancef  2gm  was used as IV antibiotics.  The patient was placed in the supine position with the (right ) the operative extremity in an arthroscopic leg holder and the opposite extremity in a padded leg holder.  The timeout was executed.  A lateral portal was established with an 11 blade and the scope was introduced into the joint. A diagnostic  arthroscopy was performed in circumferential manner examining the entire knee joint. A medial portal was established and the diagnostic arthroscopy was repeated using a probe to palpate intra-articular structures as they were encountered.    The medial  meniscus was resected using a duckbill forceps. I switched portals to complete the menisectomy The meniscal fragments were removed with a motorized shaver. The meniscus was balanced with a combination of a motorized shaver and a 50 ArthroCare wand until a stable rim was obtained.   The arthroscopic pump was placed on the wash mode and any excess debris was removed from the joint using suction.  30 cc of Marcaine  with epinephrine  was injected through the arthroscope.  The portals were closed with 3-0 nylon suture.  A sterile bandage, Ace wrap and Cryo/Cuff was placed and the Cryo/Cuff was activated. The patient was taken to the recovery room in stable condition.  PHYSICIAN ASSISTANT: no  ASSISTANTS: none   ANESTHESIA:   general   EBL:  none   BLOOD ADMINISTERED:none  DRAINS: none   LOCAL MEDICATIONS USED:  MARCAINE      SPECIMEN:  No Specimen  DISPOSITION OF SPECIMEN:  N/A  COUNTS:  YES   DICTATION: .Dragon Dictation  PLAN OF CARE: Discharge to home after PACU  PATIENT DISPOSITION:  PACU - hemodynamically stable.   Delay start of Pharmacological VTE agent (>24hrs) due to surgical blood loss or risk of bleeding: not applicable  POST OP PLAN  WBAT SUTURES OUT IN A WEEK  AROM  BRACE NO

## 2023-05-09 NOTE — Brief Op Note (Signed)
 05/09/2023  11:26 AM  PATIENT:  Mackenzie Mcpherson  73 y.o. female  PRE-OPERATIVE DIAGNOSIS:  torn medial mensicus right knee  POST-OPERATIVE DIAGNOSIS:  torn medial mensicus right knee  PROCEDURE:  Procedure(s): KNEE ARTHROSCOPY WITH MEDIAL MENISECTOMY (Right)  SURGEON:  Surgeons and Role:    DEWAINE Margrette Taft FORBES, MD - Primary  PHYSICIAN ASSISTANT:   ASSISTANTS: none   ANESTHESIA:   general  EBL:  20 mL   BLOOD ADMINISTERED:none  DRAINS: none   LOCAL MEDICATIONS USED:  MARCAINE      SPECIMEN:  No Specimen  DISPOSITION OF SPECIMEN:  N/A  COUNTS:  YES  TOURNIQUET:  * No tourniquets in log *  DICTATION: .Dragon Dictation  PLAN OF CARE: Discharge to home after PACU  PATIENT DISPOSITION:  PACU - hemodynamically stable.   Delay start of Pharmacological VTE agent (>24hrs) due to surgical blood loss or risk of bleeding: not applicable

## 2023-05-09 NOTE — Transfer of Care (Signed)
 Immediate Anesthesia Transfer of Care Note  Patient: Mackenzie Mcpherson  Procedure(s) Performed: KNEE ARTHROSCOPY WITH MEDIAL MENISECTOMY (Right: Knee)  Patient Location: PACU  Anesthesia Type:General  Level of Consciousness: awake  Airway & Oxygen Therapy: Patient Spontanous Breathing  Post-op Assessment: Report given to RN  Post vital signs: Reviewed and stable  Last Vitals:  Vitals Value Taken Time  BP 159/74 05/09/23 1134  Temp    Pulse 57 05/09/23 1136  Resp 12 05/09/23 1136  SpO2 100 % 05/09/23 1136  Vitals shown include unfiled device data.  Last Pain:  Vitals:   05/09/23 0909  TempSrc: Oral  PainSc: 0-No pain      Patients Stated Pain Goal: 5 (05/09/23 0909)  Complications: No notable events documented.

## 2023-05-09 NOTE — Interval H&P Note (Signed)
 History and Physical Interval Note:  05/09/2023 10:26 AM  Mackenzie Mcpherson  has presented today for surgery, with the diagnosis of torn medial mensicus right knee.  The various methods of treatment have been discussed with the patient and family. After consideration of risks, benefits and other options for treatment, the patient has consented to  Procedure(s): KNEE ARTHROSCOPY WITH MEDIAL MENISECTOMY (Right) as a surgical intervention.  The patient's history has been reviewed, patient examined, no change in status, stable for surgery.  I have reviewed the patient's chart and labs.  Questions were answered to the patient's satisfaction.     Taft Minerva

## 2023-05-09 NOTE — Anesthesia Preprocedure Evaluation (Addendum)
 Anesthesia Evaluation  Patient identified by MRN, date of birth, ID band Patient awake    Reviewed: Allergy & Precautions, H&P , NPO status , Patient's Chart, lab work & pertinent test results, reviewed documented beta blocker date and time   Airway Mallampati: II  TM Distance: >3 FB Neck ROM: Full    Dental no notable dental hx. (+) Dental Advisory Given, Teeth Intact   Pulmonary neg pulmonary ROS   Pulmonary exam normal breath sounds clear to auscultation       Cardiovascular hypertension, Pt. on medications and Pt. on home beta blockers + Past MI  Normal cardiovascular exam Rhythm:Regular Rate:Normal     Neuro/Psych  Neuromuscular disease  negative psych ROS   GI/Hepatic Neg liver ROS, hiatal hernia,GERD  Medicated and Controlled,,  Endo/Other  Hypothyroidism    Renal/GU negative Renal ROS  negative genitourinary   Musculoskeletal  (+) Arthritis , Osteoarthritis,    Abdominal Normal abdominal exam  (+)   Peds negative pediatric ROS (+)  Hematology negative hematology ROS (+)   Anesthesia Other Findings   Reproductive/Obstetrics negative OB ROS                             Anesthesia Physical Anesthesia Plan  ASA: 3  Anesthesia Plan: General   Post-op Pain Management:    Induction: Intravenous  PONV Risk Score and Plan: 4 or greater and Dexamethasone , Ondansetron  and Midazolam   Airway Management Planned: LMA  Additional Equipment: None  Intra-op Plan:   Post-operative Plan: Extubation in OR  Informed Consent: I have reviewed the patients History and Physical, chart, labs and discussed the procedure including the risks, benefits and alternatives for the proposed anesthesia with the patient or authorized representative who has indicated his/her understanding and acceptance.     Dental advisory given  Plan Discussed with: CRNA and Surgeon  Anesthesia Plan Comments:         Anesthesia Quick Evaluation

## 2023-05-09 NOTE — Anesthesia Procedure Notes (Signed)
 Procedure Name: LMA Insertion Date/Time: 05/09/2023 10:46 AM  Performed by: Toribio Darice BRAVO, CRNAPre-anesthesia Checklist: Patient identified, Patient being monitored, Emergency Drugs available, Timeout performed and Suction available Patient Re-evaluated:Patient Re-evaluated prior to induction Oxygen Delivery Method: Circle System Utilized Preoxygenation: Pre-oxygenation with 100% oxygen Induction Type: IV induction Ventilation: Mask ventilation without difficulty LMA: LMA inserted LMA Size: 3.0 Number of attempts: 1 Placement Confirmation: positive ETCO2 and breath sounds checked- equal and bilateral

## 2023-05-10 ENCOUNTER — Encounter (HOSPITAL_COMMUNITY): Payer: Self-pay | Admitting: Orthopedic Surgery

## 2023-05-17 ENCOUNTER — Ambulatory Visit (INDEPENDENT_AMBULATORY_CARE_PROVIDER_SITE_OTHER): Payer: Federal, State, Local not specified - PPO | Admitting: Orthopedic Surgery

## 2023-05-17 DIAGNOSIS — M23321 Other meniscus derangements, posterior horn of medial meniscus, right knee: Secondary | ICD-10-CM

## 2023-05-17 DIAGNOSIS — Z9889 Other specified postprocedural states: Secondary | ICD-10-CM

## 2023-05-17 NOTE — Progress Notes (Signed)
   There were no vitals taken for this visit.  There is no height or weight on file to calculate BMI.  Chief Complaint  Patient presents with   Post-op Follow-up    Right knee scope     Encounter Diagnosis  Name Primary?   S/P right knee arthroscopy 05/10/23 Yes    DOI/DOS/ Date: 05/10/23  Improved

## 2023-05-17 NOTE — Progress Notes (Signed)
Patient ID: Mackenzie Mcpherson, female   DOB: 23-Mar-1951, 73 y.o.   MRN: [id number]  Postop visit #1 status post arthroscopy right knee partial medial meniscectomy grade 0-1 arthritis in the joint large body tear small posterior horn tear  Patient doing well   Chief Complaint  Patient presents with   Post-op Follow-up    Right knee scope     Encounter Diagnosis  Name Primary?   S/P right knee arthroscopy 05/10/23 Yes   Encounter Diagnoses  Name Primary?   S/P right knee arthroscopy 05/10/23 Yes   Other meniscus derangements, posterior horn of medial meniscus, right knee      DOI/DOS/ Date: 05/10/23  Suture removal.  Minimal effusion 90 degree range of motion ambulatory with a cane  Return in 3 weeks  Start quadricep strengthening exercises at home

## 2023-06-02 NOTE — Progress Notes (Signed)
   POST OP VISIT # 2  Encounter Diagnoses  Name Primary?   S/P right knee arthroscopy 05/10/23 Yes   Other meniscus derangements, posterior horn of medial meniscus, right knee     Chief Complaint  Patient presents with   Post-op Follow-up    R Knee    This is postop day number 25  Postop pain control  tylenol , ib  Subjective complaints  medial knee pain: mild   Physical exam findings  FROM, no Effx;   Assessment and plan  73 year old female doing well postop after knee arthroscopy with partial medial meniscectomy  Continue home exercises return in 1 month

## 2023-06-05 ENCOUNTER — Ambulatory Visit (INDEPENDENT_AMBULATORY_CARE_PROVIDER_SITE_OTHER): Payer: Federal, State, Local not specified - PPO | Admitting: Orthopedic Surgery

## 2023-06-05 DIAGNOSIS — Z9889 Other specified postprocedural states: Secondary | ICD-10-CM

## 2023-06-05 DIAGNOSIS — M23321 Other meniscus derangements, posterior horn of medial meniscus, right knee: Secondary | ICD-10-CM

## 2023-06-05 NOTE — Progress Notes (Signed)
 Patient ID: Mackenzie Mcpherson, female   DOB: [birth year], 73 y.o.   MRN: [id number]     POST OP VISIT # 2  Encounter Diagnoses  Name Primary?   S/P right knee arthroscopy 05/10/23 Yes   Other meniscus derangements, posterior horn of medial meniscus, right knee     Chief Complaint  Patient presents with   Post-op Follow-up    R Knee    This is postop day number 25  Postop pain control  tylenol , ib  Subjective complaints  medial knee pain: mild   Physical exam findings  FROM, no Effx;   Assessment and plan  73 year old female doing well postop after knee arthroscopy with partial medial meniscectomy  Continue home exercises return in 1 month

## 2023-06-29 ENCOUNTER — Other Ambulatory Visit (HOSPITAL_COMMUNITY): Payer: Self-pay | Admitting: Internal Medicine

## 2023-06-29 DIAGNOSIS — Z1231 Encounter for screening mammogram for malignant neoplasm of breast: Secondary | ICD-10-CM

## 2023-06-30 ENCOUNTER — Ambulatory Visit: Payer: Federal, State, Local not specified - PPO | Admitting: Orthopedic Surgery

## 2023-06-30 ENCOUNTER — Encounter: Payer: Self-pay | Admitting: Orthopedic Surgery

## 2023-06-30 DIAGNOSIS — Z9889 Other specified postprocedural states: Secondary | ICD-10-CM

## 2023-06-30 DIAGNOSIS — M23321 Other meniscus derangements, posterior horn of medial meniscus, right knee: Secondary | ICD-10-CM

## 2023-06-30 NOTE — Progress Notes (Signed)
   There were no vitals taken for this visit.  There is no height or weight on file to calculate BMI.  Chief Complaint  Patient presents with   Post-op Follow-up    05/10/23 right knee     Encounter Diagnosis  Name Primary?   S/P right knee arthroscopy 05/10/23 Yes    DOI/DOS/ Date: 05/10/23  Improved

## 2023-06-30 NOTE — Progress Notes (Signed)
   There were no vitals taken for this visit.  There is no height or weight on file to calculate BMI.  Chief Complaint  Patient presents with   Post-op Follow-up    05/10/23 right knee     Encounter Diagnoses  Name Primary?   S/P right knee arthroscopy 05/10/23 Yes   Other meniscus derangements, posterior horn of medial meniscus, right knee     DOI/DOS/ Date: 05/10/23  She continues to do well    She regained full range of motion there is no swelling she can continue to use her cane on a as needed basis  Improved return as needed

## 2023-07-21 ENCOUNTER — Ambulatory Visit (HOSPITAL_COMMUNITY)

## 2023-07-24 ENCOUNTER — Ambulatory Visit (HOSPITAL_COMMUNITY)
Admission: RE | Admit: 2023-07-24 | Discharge: 2023-07-24 | Disposition: A | Discharge status: Home / Self Care | Source: Ambulatory Visit | Attending: Internal Medicine | Admitting: Internal Medicine

## 2023-07-24 DIAGNOSIS — Z1231 Encounter for screening mammogram for malignant neoplasm of breast: Secondary | ICD-10-CM | POA: Diagnosis present

## 2023-12-12 ENCOUNTER — Ambulatory Visit
Admission: EM | Admit: 2023-12-12 | Discharge: 2023-12-12 | Disposition: A | Discharge status: Home / Self Care | Attending: Nurse Practitioner | Admitting: Nurse Practitioner

## 2023-12-12 ENCOUNTER — Other Ambulatory Visit: Payer: Self-pay

## 2023-12-12 ENCOUNTER — Encounter: Payer: Self-pay | Admitting: Emergency Medicine

## 2023-12-12 DIAGNOSIS — U071 COVID-19: Secondary | ICD-10-CM | POA: Diagnosis not present

## 2023-12-12 LAB — POC SOFIA SARS ANTIGEN FIA: SARS Coronavirus 2 Ag: POSITIVE — AB

## 2023-12-12 MED ORDER — PROMETHAZINE-DM 6.25-15 MG/5ML PO SYRP: 5.0000 mL | ORAL_SOLUTION | Freq: Four times a day (QID) | ORAL | 0 refills | Status: AC | PRN

## 2023-12-12 MED ORDER — PAXLOVID (300/100) 20 X 150 MG & 10 X 100MG PO TBPK: 3.0000 | ORAL_TABLET | Freq: Two times a day (BID) | ORAL | 0 refills | Status: AC

## 2023-12-12 MED ORDER — FLUTICASONE PROPIONATE 50 MCG/ACT NA SUSP: 2.0000 | Freq: Every day | NASAL | 0 refills | Status: AC

## 2023-12-12 NOTE — ED Provider Notes (Signed)
 [facility]    CSN: [id number] Arrival date & time: 12/12/23  1021      History   Chief Complaint Chief Complaint  Patient presents with   Cough    HPI Mackenzie Mcpherson is a 73 y.o. female.   The history is provided by the patient.   Patient presents with a 2-day history of nasal congestion, cough, postnasal drainage, and chills.  Patient states that chills have since improved.  Patient denies fever, headache, ear pain, sore throat, wheezing, difficulty breathing, chest pain, abdominal pain, nausea, vomiting, diarrhea, or rash.  Patient reports that she did go to a funeral prior to her symptoms starting, also states that several people in her office have also been coughing.  States she has been taking over-the-counter Mucinex, Allegra, saline nasal spray, and drinking ginger tea.    Past Medical History:  Diagnosis Date   Arthritis    Diverticulitis 2010   GERD (gastroesophageal reflux disease)    errosive esophagitis in 2010   Heart attack (HCC) 04/2018   spasm of RCA   Hemorrhoids    Hiatal hernia    Hypertension    Hypothyroidism    Schatzki's ring    last dilation 2010    Patient Active Problem List   Diagnosis Date Noted   S/P left knee arthroscopy 05/17/22 05/18/2022   Tear of medial meniscus of right knee, current 05/17/2022   Achilles tendinitis 01/19/2021   Hyperlipidemia 01/18/2021   Hypertensive disorder 01/18/2021   Hypothyroidism 01/18/2021   Polycystic ovary syndrome 01/18/2021   Polyp of colon 01/18/2021   Prediabetes 01/18/2021   Acute coronary syndrome (HCC) 05/11/2018   Chest pain 05/10/2018   Flatus 03/15/2018   Family hx of colon cancer    Family history of colon cancer 12/22/2014   Hiatal hernia    Dysphagia 05/20/2014   Schatzki's ring 05/20/2014   Diverticulitis 12/21/2011   Abdominal pain 12/21/2011   Diarrhea 12/21/2011   ARM PAIN, RIGHT 06/17/2010   MEDIAL EPICONDYLITIS 05/05/2010   Lateral epicondylitis  05/05/2010   BLOOD IN STOOL 11/18/2009   OBESITY, UNSPECIFIED 07/30/2008   GERD 07/30/2008   DYSPHAGIA PHARYNGEAL PHASE 07/30/2008   HYPERTENSION, HX OF 07/30/2008    Past Surgical History:  Procedure Laterality Date   ABDOMINAL HYSTERECTOMY  1986   w/ unilateral salpingoopherectomy (can't remember which one)   APPENDECTOMY     CHOLECYSTECTOMY     COLONOSCOPY  11/19/2004   Normal rectum/Normal colon   COLONOSCOPY  12/11/09   Rourk-minimally friable anal canal/hemorrhoids otherwise normal   COLONOSCOPY N/A 02/11/2015   rourk: normal. next exam in 5 years.   COLONOSCOPY N/A 08/12/2020   Procedure: COLONOSCOPY;  Surgeon: Shaaron Lamar HERO, MD;  Location: [facility];  Service: Endoscopy;  Laterality: N/A;  AM   ESOPHAGOGASTRODUODENOSCOPY  08/15/2008   Moderate size hiatal hernia/Noncritical Schatzki's ring with superimposed distal esophageal erosion consistent with erosive reflux esophagitis   ESOPHAGOGASTRODUODENOSCOPY N/A 06/11/2014   MFM:dryjusxp'd ring dilated/HH   KNEE ARTHROSCOPY WITH MEDIAL MENISECTOMY Left 05/17/2022   Procedure: KNEE ARTHROSCOPY WITH MEDIAL MENISECTOMY;  Surgeon: Margrette Taft BRAVO, MD;  Location: [facility];  Service: Orthopedics;  Laterality: Left;   KNEE ARTHROSCOPY WITH MEDIAL MENISECTOMY Right 05/09/2023   Procedure: KNEE ARTHROSCOPY WITH MEDIAL MENISECTOMY;  Surgeon: Margrette Taft BRAVO, MD;  Location: [facility];  Service: Orthopedics;  Laterality: Right;   LEFT HEART CATH AND CORONARY ANGIOGRAPHY N/A 05/11/2018   Procedure: LEFT HEART CATH AND CORONARY ANGIOGRAPHY;  Surgeon: Claudene,  Salena, MD;  Location: [facility];  Service: Cardiovascular;  Laterality: N/A;   POLYPECTOMY  08/12/2020   Procedure: POLYPECTOMY;  Surgeon: Shaaron Lamar HERO, MD;  Location: [facility];  Service: Endoscopy;;    OB History   No obstetric history on file.      Home Medications    Prior to Admission medications   Medication Sig Start Date End Date Taking? Authorizing  Provider  acetaminophen  (TYLENOL ) 500 MG tablet Take 1 tablet (500 mg total) by mouth every 6 (six) hours as needed. 05/09/23   Margrette Taft BRAVO, MD  aspirin  EC 81 MG EC tablet Take 1 tablet (81 mg total) by mouth daily. 05/12/18   Claudene Salena, MD  b complex vitamins capsule Take 1 capsule by mouth in the morning.    [provider]  Biotin w/ Vitamins C & E (HAIR SKIN & NAILS GUMMIES PO) Take 1 tablet by mouth in the morning and at bedtime.    [provider]  BLACK CURRANT SEED OIL PO Take 1 drop by mouth in the morning.    [provider]  calcium  carbonate (TUMS - DOSED IN MG ELEMENTAL CALCIUM ) 500 MG chewable tablet Chew 500-1,000 mg by mouth 2 (two) times daily as needed for indigestion or heartburn.    [provider]  Cholecalciferol (VITAMIN D3) 5000 UNITS TABS Take 5,000 Units by mouth daily at 12 noon.    [provider]  ibuprofen  (ADVIL ) 800 MG tablet Take 1 tablet (800 mg total) by mouth every 8 (eight) hours as needed. 05/09/23   Margrette Taft BRAVO, MD  losartan -hydrochlorothiazide (HYZAAR) 100-12.5 MG tablet Take 1 tablet by mouth daily. 12/10/20   Elnor Lauraine BRAVO, NP  metoprolol  tartrate (LOPRESSOR ) 25 MG tablet TAKE 1/2 TABLET BY MOUTH TWICE DAILY 11/30/20   Elnor Lauraine BRAVO, NP  Misc Natural Products (JOINT HEALTH PO) Take 1 capsule by mouth in the morning. Arthrozene's Natural Joint Health Supplement    [provider]  Multiple Vitamin (MULTIVITAMIN WITH MINERALS) TABS tablet Take 1 tablet by mouth in the morning and at bedtime.    [provider]  nitroGLYCERIN  (NITROSTAT ) 0.4 MG SL tablet Place 1 tablet (0.4 mg total) under the tongue every 5 (five) minutes x 3 doses as needed for chest pain. 12/10/20   Elnor Lauraine BRAVO, NP  Polyethyl Glycol-Propyl Glycol (SYSTANE OP) Place 1 drop into both eyes daily as needed (dry eyes).    [provider]  Probiotic Product (PROBIOTIC PO) Take 1 capsule by mouth every evening.     [provider]  rosuvastatin  (CRESTOR ) 40 MG tablet Take 1 tablet (40 mg total) by mouth daily. Patient taking differently: Take 40 mg by mouth at bedtime. 12/10/20   Gray, Sarah E, NP  sodium chloride  (OCEAN) 0.65 % SOLN nasal spray Place 1 spray into both nostrils at bedtime as needed for congestion.    [provider]  thyroid  (ARMOUR) 60 MG tablet Take 60 mg by mouth daily before breakfast.    [provider]  VITAMIN E PO Take 1 capsule by mouth in the morning.    [provider]    Family History Family History  Problem Relation Age of Onset   Hypertension Father    Stroke Mother    Diabetes Mother    Colon cancer Brother 44   Colon cancer Brother 83   Prostate cancer Brother     Social History Social History   Tobacco Use  Smoking status: Never   Smokeless tobacco: Never  Vaping Use   Vaping status: Never Used  Substance Use Topics   Alcohol use: No    Alcohol/week: 0.0 standard drinks of alcohol   Drug use: No     Allergies   Pantoprazole sodium   Review of Systems Review of Systems Per HPI  Physical Exam Triage Vital Signs ED Triage Vitals  Encounter Vitals Group     BP 12/12/23 1141 137/74     Girls Systolic BP Percentile --      Girls Diastolic BP Percentile --      Boys Systolic BP Percentile --      Boys Diastolic BP Percentile --      Pulse Rate 12/12/23 1141 67     Resp 12/12/23 1141 20     Temp 12/12/23 1141 98.7 F (37.1 C)     Temp Source 12/12/23 1141 Oral     SpO2 12/12/23 1141 93 %     Weight --      Height --      Head Circumference --      Peak Flow --      Pain Score 12/12/23 1140 0     Pain Loc --      Pain Education --      Exclude from Growth Chart --    No data found.  Updated Vital Signs BP 137/74 (BP Location: Right Arm)   Pulse 67   Temp 98.7 F (37.1 C) (Oral)   Resp 20   SpO2 93%   Visual Acuity Right Eye Distance:   Left Eye Distance:   Bilateral Distance:    Right  Eye Near:   Left Eye Near:    Bilateral Near:     Physical Exam Constitutional:      Appearance: She is well-developed.  HENT:     Head: Normocephalic and atraumatic.     Right Ear: Tympanic membrane, ear canal and external ear normal.     Left Ear: Tympanic membrane, ear canal and external ear normal.     Nose: Nose normal.     Right Turbinates: Enlarged and swollen.     Left Turbinates: Enlarged and swollen.     Right Sinus: No maxillary sinus tenderness or frontal sinus tenderness.     Left Sinus: No maxillary sinus tenderness or frontal sinus tenderness.     Mouth/Throat:     Lips: Pink.     Mouth: Mucous membranes are moist.     Pharynx: Postnasal drip present. No pharyngeal swelling, oropharyngeal exudate, posterior oropharyngeal erythema or uvula swelling.     Comments: Cobblestoning present to posterior oropharynx  Eyes:     Conjunctiva/sclera: Conjunctivae normal.     Pupils: Pupils are equal, round, and reactive to light.  Neck:     Thyroid : No thyromegaly.     Trachea: No tracheal deviation.  Cardiovascular:     Rate and Rhythm: Normal rate and regular rhythm.     Pulses: Normal pulses.     Heart sounds: Normal heart sounds.  Pulmonary:     Effort: Pulmonary effort is normal. No respiratory distress.     Breath sounds: Normal breath sounds. No stridor. No wheezing, rhonchi or rales.  Abdominal:     General: Bowel sounds are normal. There is no distension.     Palpations: Abdomen is soft.     Tenderness: There is no abdominal tenderness.  Musculoskeletal:     Cervical back: Normal range of motion and  neck supple.  Skin:    General: Skin is warm and dry.  Neurological:     Mental Status: She is alert and oriented to person, place, and time.  Psychiatric:        Behavior: Behavior normal.        Thought Content: Thought content normal.        Judgment: Judgment normal.      UC Treatments / Results  Labs (all labs ordered are listed, but only abnormal  results are displayed) Labs Reviewed  POC SOFIA SARS ANTIGEN FIA - Abnormal; Notable for the following components:      Result Value   SARS Coronavirus 2 Ag Positive (*)    All other components within normal limits    EKG   Radiology No results found.  Procedures Procedures (including critical care time)  Medications Ordered in UC Medications - No data to display  Initial Impression / Assessment and Plan / UC Course  I have reviewed the triage vital signs and the nursing notes.  Pertinent labs & imaging results that were available during my care of the patient were reviewed by me and considered in my medical decision making (see chart for details).  COVID test was positive.  Will start Paxlovid .  Symptomatic treatment provided with Promethazine  DM for the cough, and fluticasone  50 mcg nasal spray for nasal congestion and runny nose.  Patient advised she can continue use of the Mucinex during the daytime and use of her allergy medication and saline nasal spray.  Patient advised to hold her rosuvastatin  for the next 2 weeks.  Supportive care recommendations were provided and discussed with the patient to include fluids, rest, over-the-counter analgesics, and use of a humidifier at nighttime during sleep.  Discussed indications with patient regarding follow-up, patient was also given strict ER follow-up precautions.  Patient was in agreement with this plan of care and verbalizes understanding.  All questions were answered.  Patient stable for discharge.  Work note was provided.  Final Clinical Impressions(s) / UC Diagnoses   Final diagnoses:  None   Discharge Instructions   None    ED Prescriptions   None    PDMP not reviewed this encounter.   Gilmer Etta PARAS, NP 12/12/23 1208

## 2023-12-12 NOTE — ED Triage Notes (Signed)
 Pt reports nasal congestion, productive cough since Sunday. Pt reports has tried otc mucinex, allegra, saline nasal spray with minimal relief of symptoms and reports now its in my chest. Denies any known fevers but reports intermittent chills.

## 2023-12-12 NOTE — Discharge Instructions (Signed)
 Your COVID test was positive today. Take medication as prescribed.  As discussed, please hold your Crestor /rosuvastatin  for the next 2 weeks. You may continue over-the-counter Mucinex during the daytime and normal saline nasal spray along with your regular allergy medication. Increase fluids and allow for plenty of rest. You may take over-the-counter Tylenol  as needed for pain, fever, or general discomfort. Continue use of your normal saline nasal spray throughout the day for nasal congestion or runny nose. Recommend use of a humidifier in your bedroom at nighttime during sleep and sleeping elevated on pillows while symptoms persist. If you develop a fever, you will need to remain home until you have been fever free for 24 hours with no medication. While you are taking Paxlovid , continue to wear your mask.  When she complete the medication, if you continue to experience symptoms, continue to wear your mask for an additional 5 days. Go to the emergency department immediately if you develop fever, shortness of breath, difficulty breathing, or other concerns. Follow-up as needed.
